# Patient Record
Sex: Male | Born: 1950 | Race: White | Hispanic: No | Marital: Single | Smoking: Former smoker
Health system: Southern US, Community
[De-identification: ages and names within clinical notes are randomized; demographics above are authoritative.]

## PROBLEM LIST (undated history)

## (undated) DIAGNOSIS — R4182 Altered mental status, unspecified: Secondary | ICD-10-CM

## (undated) DIAGNOSIS — E785 Hyperlipidemia, unspecified: Secondary | ICD-10-CM

## (undated) DIAGNOSIS — I219 Acute myocardial infarction, unspecified: Secondary | ICD-10-CM

## (undated) DIAGNOSIS — I10 Essential (primary) hypertension: Secondary | ICD-10-CM

---

## 1998-06-30 ENCOUNTER — Emergency Department (HOSPITAL_COMMUNITY): Admission: EM | Admit: 1998-06-30 | Discharge: 1998-06-30 | Payer: Self-pay | Admitting: Emergency Medicine

## 2000-02-27 ENCOUNTER — Ambulatory Visit (HOSPITAL_COMMUNITY): Admission: RE | Admit: 2000-02-27 | Discharge: 2000-02-28 | Payer: Self-pay | Admitting: Cardiovascular Disease

## 2007-04-27 ENCOUNTER — Emergency Department (HOSPITAL_COMMUNITY): Admission: EM | Admit: 2007-04-27 | Discharge: 2007-04-27 | Payer: Self-pay | Admitting: Emergency Medicine

## 2008-08-10 ENCOUNTER — Inpatient Hospital Stay (HOSPITAL_COMMUNITY): Admission: EM | Admit: 2008-08-10 | Discharge: 2008-08-12 | Payer: Self-pay | Admitting: Emergency Medicine

## 2008-08-10 IMAGING — CT CT ABDOMEN W/ CM
1 of 2 series · 15 of 32 positions shown, 19 images · IV contrast (agent unspecified)
Comparison: None.

CT ABDOMEN

CLINICAL DATA: Diffuse abdominal pain.  Nausea vomiting and
constipation.

CT ABDOMEN AND PELVIS WITH CONTRAST
TECHNIQUE: Multidetector CT imaging of the abdomen and pelvis was
performed using the standard protocol following bolus
administration of intravenous contrast.
Contrast: 100 ml [JN] IV.

[Series 2: rtn ap with st · axial · 0.74mm/px · z∈[-460,-10]mm · 15 of 100 slices shown, 19 images]
[im 5/100  soft-tissue]
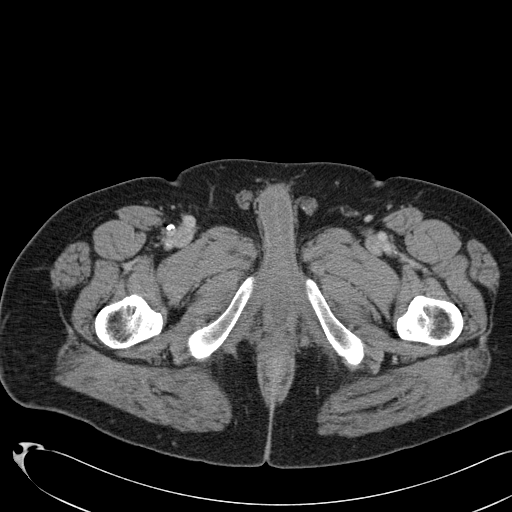
[im 5/100  bone]
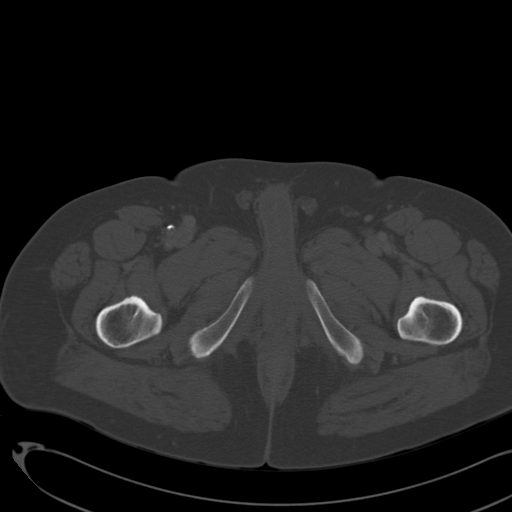
[im 13/100  soft-tissue]
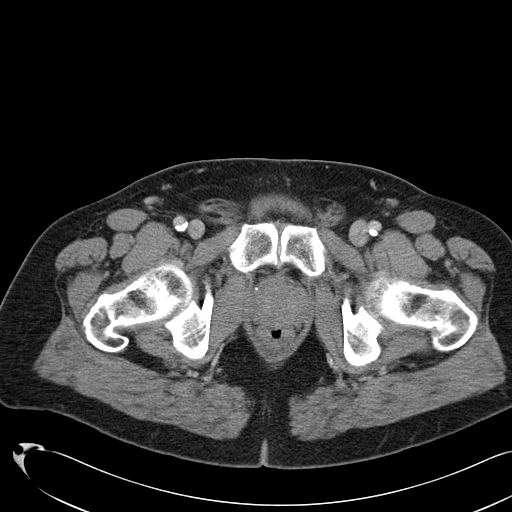
[im 22/100  soft-tissue]
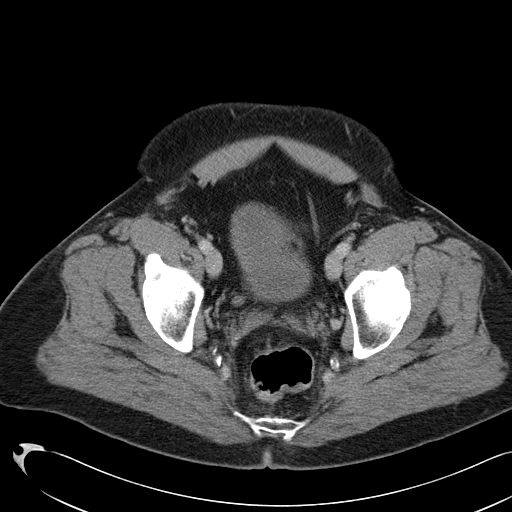
[im 26/100  soft-tissue]
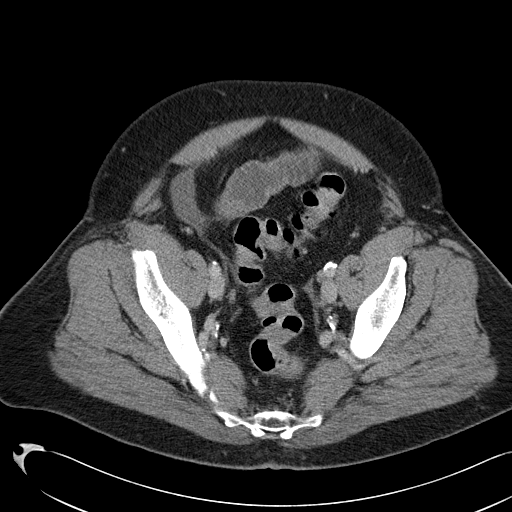
[im 35/100  soft-tissue]
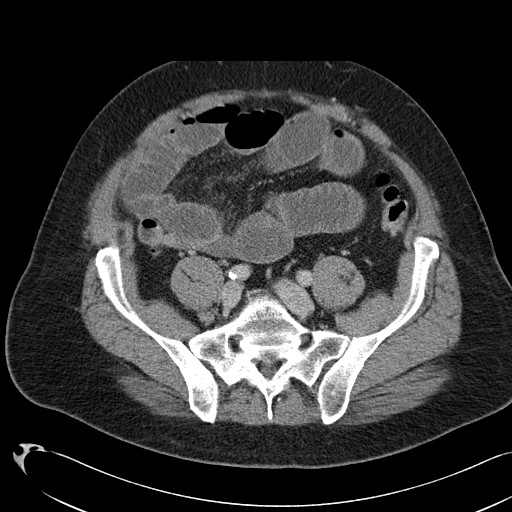
[im 44/100  soft-tissue]
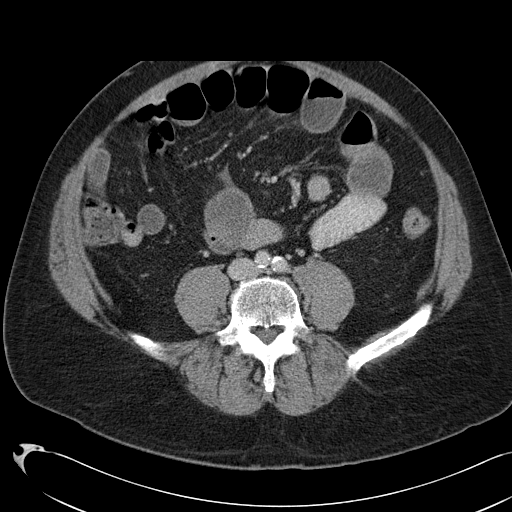
[im 52/100  soft-tissue]
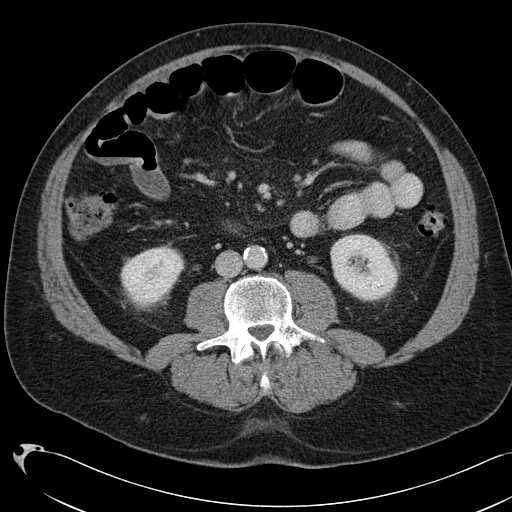
[im 56/100  soft-tissue]
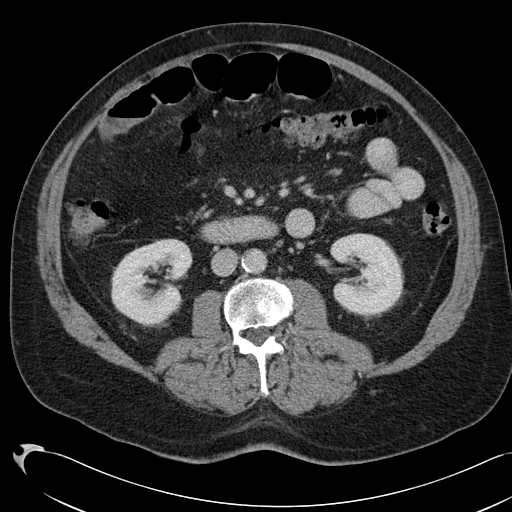
[im 65/100  soft-tissue]
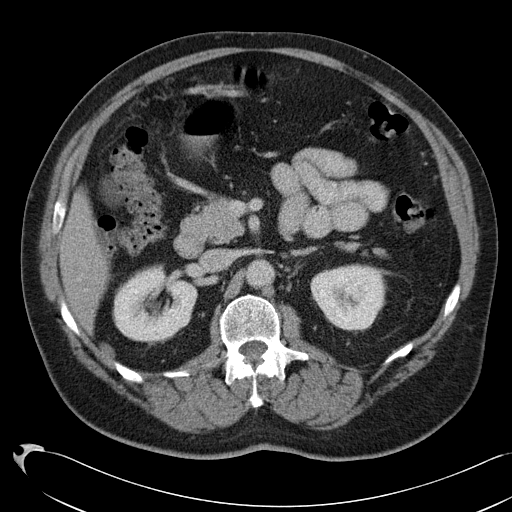
[im 65/100  bone]
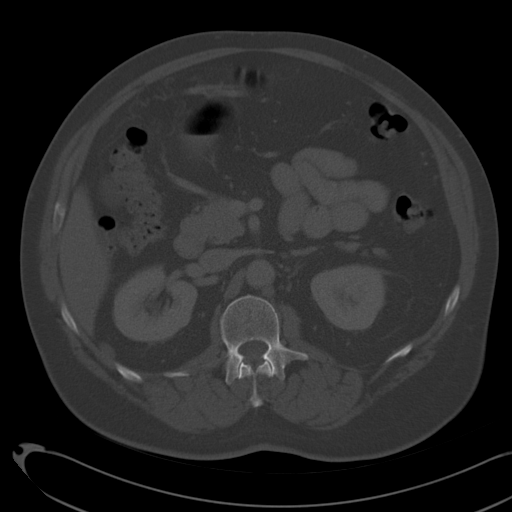
[im 74/100  soft-tissue]
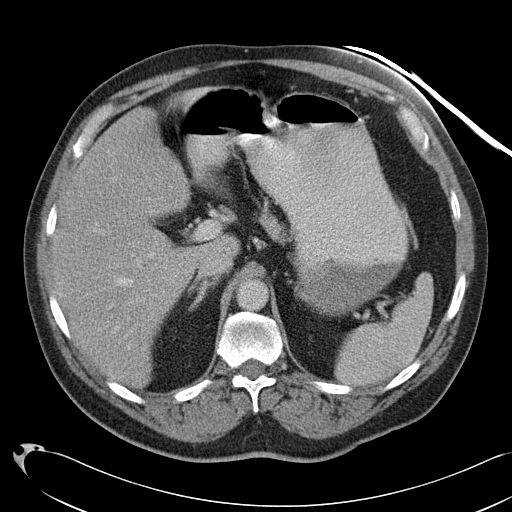
[im 78/100  soft-tissue]
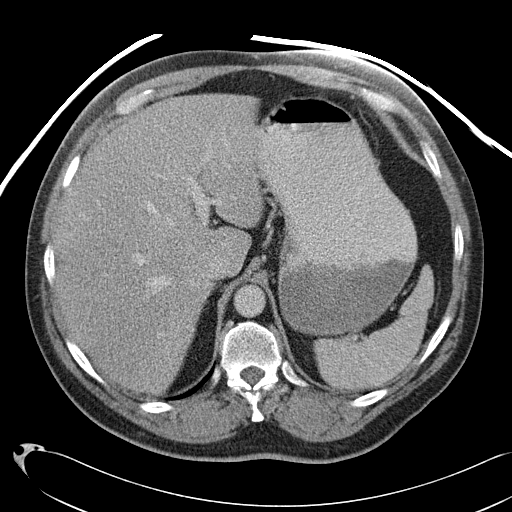
[im 82/100  lung]
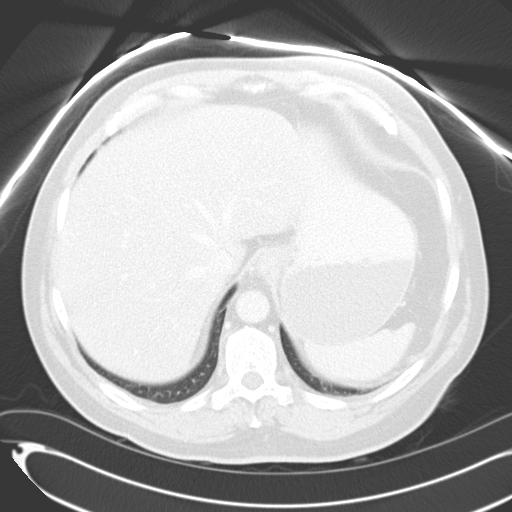
[im 87/100  soft-tissue]
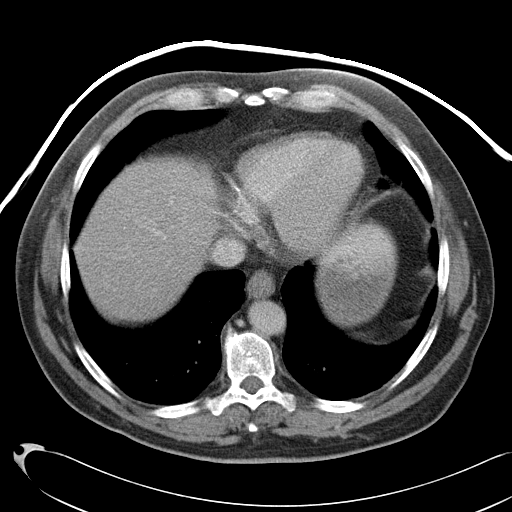
[im 87/100  lung]
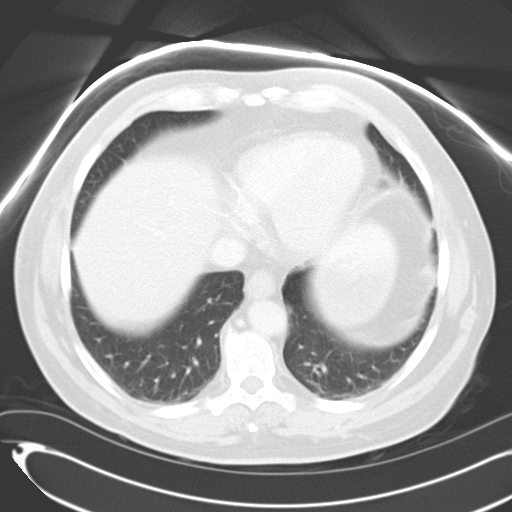
[im 91/100  lung]
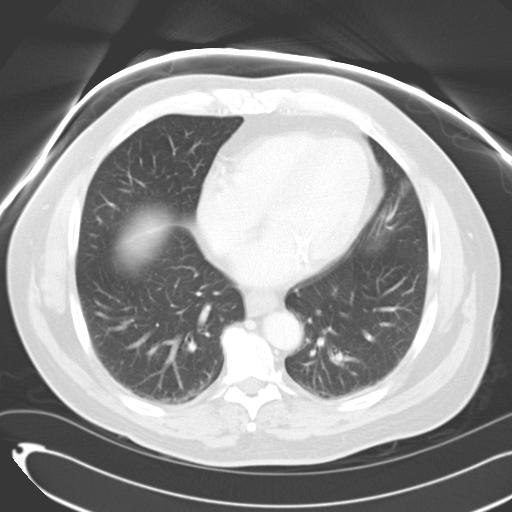
[im 95/100  soft-tissue]
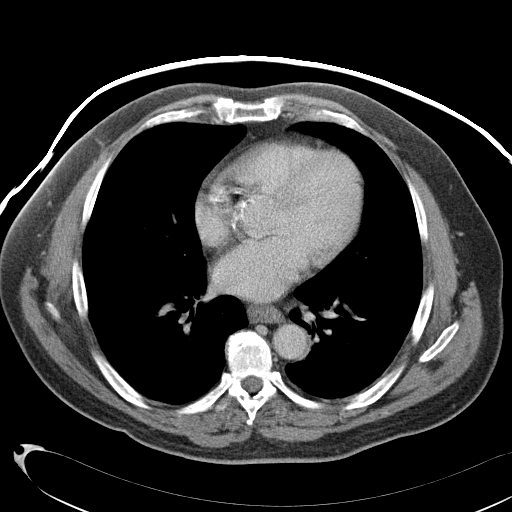
[im 95/100  lung]
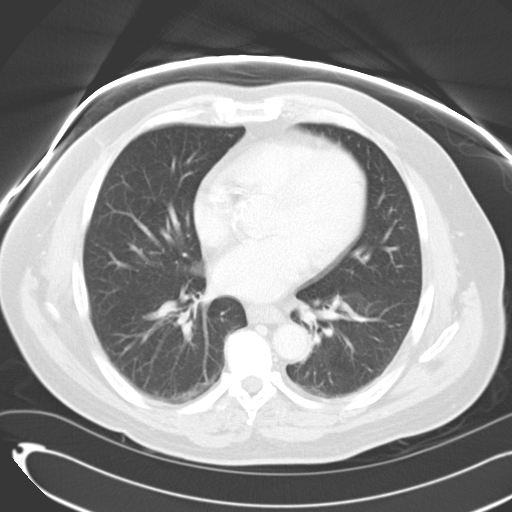

[15 of 32 positions shown; findings below may reference images not displayed]

FINDINGS: The lung bases are clear.  There is coronary artery
calcification.  The heart size is normal.

The liver and gallbladder are normal.  The pancreas spleen and
kidneys are normal.  There is no mass or adenopathy.  The proximal
small bowel is not dilated.  There is pandiverticulosis in the
colon.
IMPRESSION: No acute abnormality.  Colonic diverticulosis is extensive but no
acute diverticulitis is present.

CT PELVIS
FINDINGS: There is pan colonic diverticulosis.  Fluid-filled
small bowel loops the pelvis are mildly dilated.  The proximal
small bowel   is nondilated.  This could be due to a distal colonic
obstruction or ileus.  The colon is not dilated.  The appendix is
normal.  There is no free fluid or mass.  The prostate is mildly
enlarged.
IMPRESSION: Mildly dilated fluid-filled loops of ileum in the pelvis may
represent an ileus or a partial obstruction.

## 2008-08-10 IMAGING — CR DG ABDOMEN ACUTE W/ 1V CHEST
4 series · 4 of 4 positions shown · non-contrast
Comparison: None available.

CLINICAL DATA: Abdominal pain and nausea.

ACUTE ABDOMEN SERIES (ABDOMEN 2 VIEW & CHEST 1 VIEW)

[w chest pa]
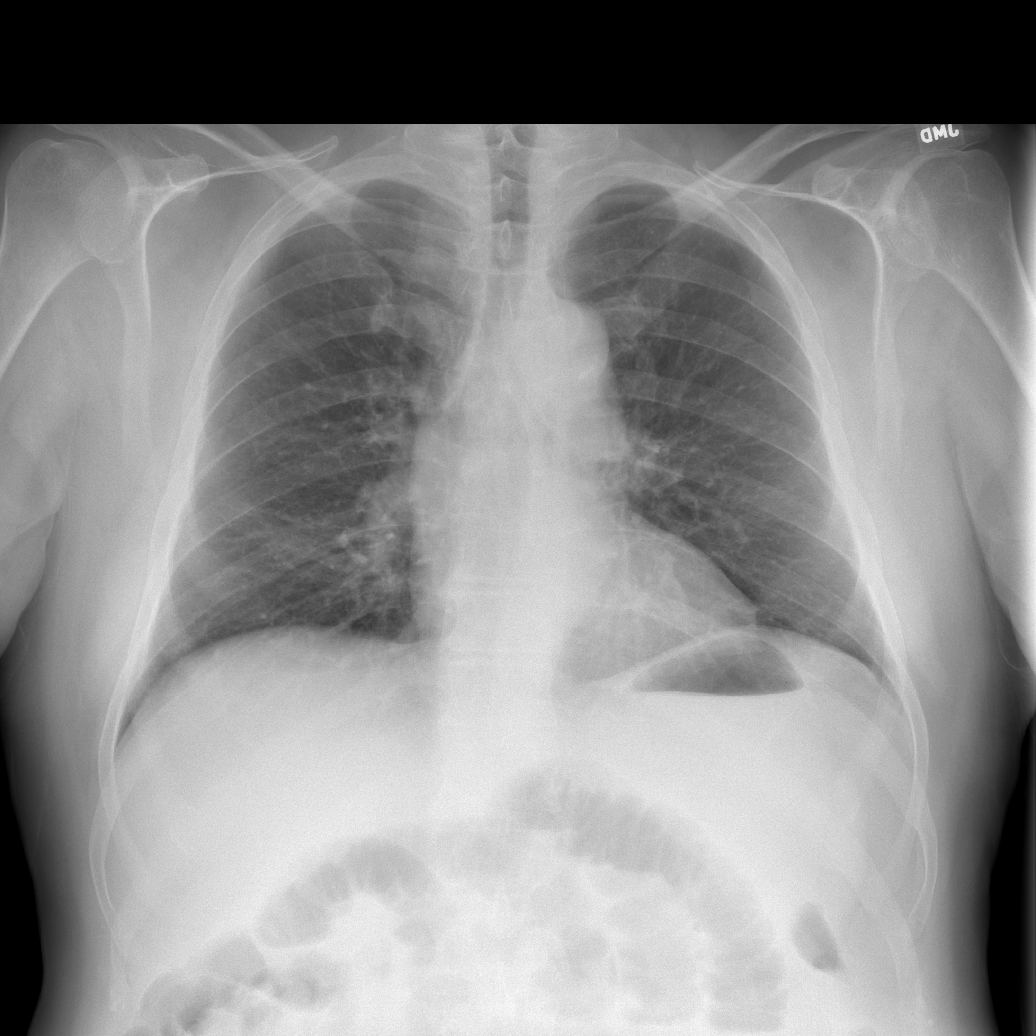

[w abdomen upright]
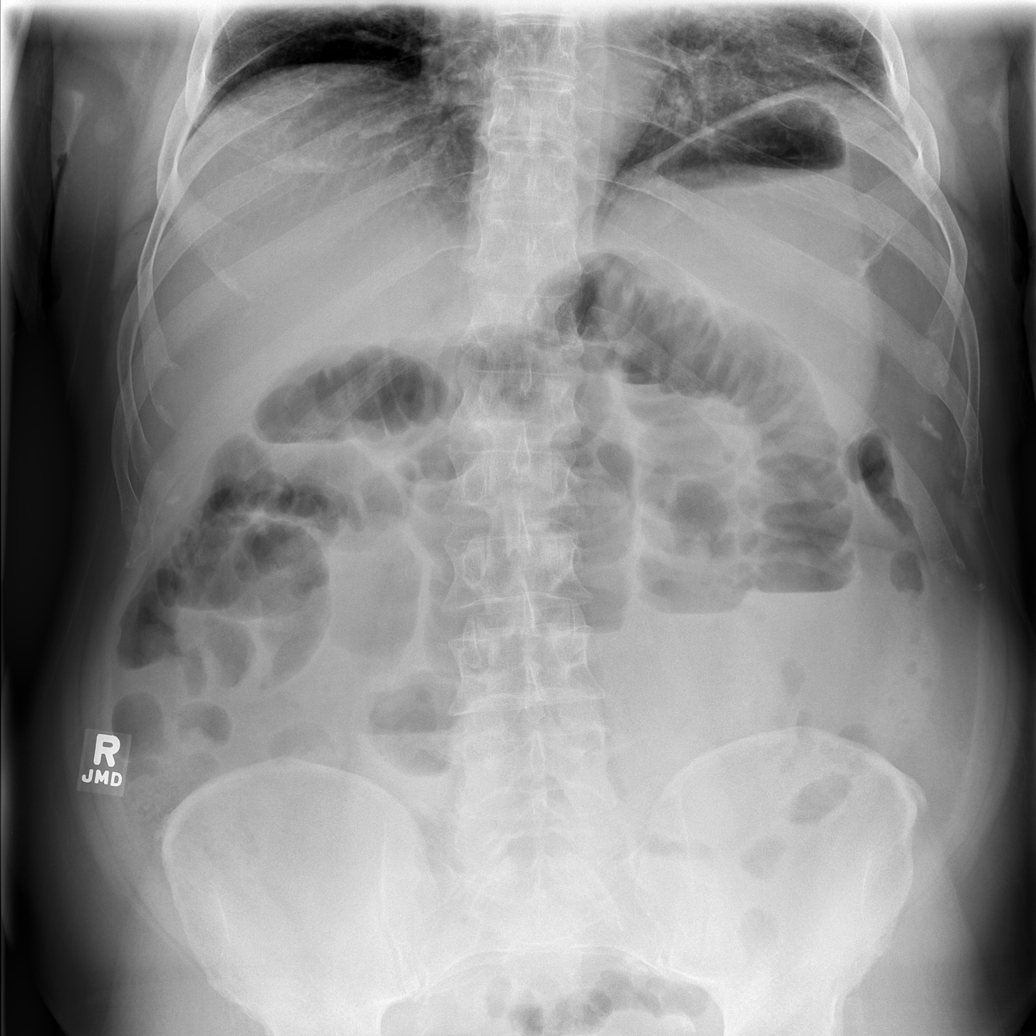

[t abdomen supine (1 of 2)]
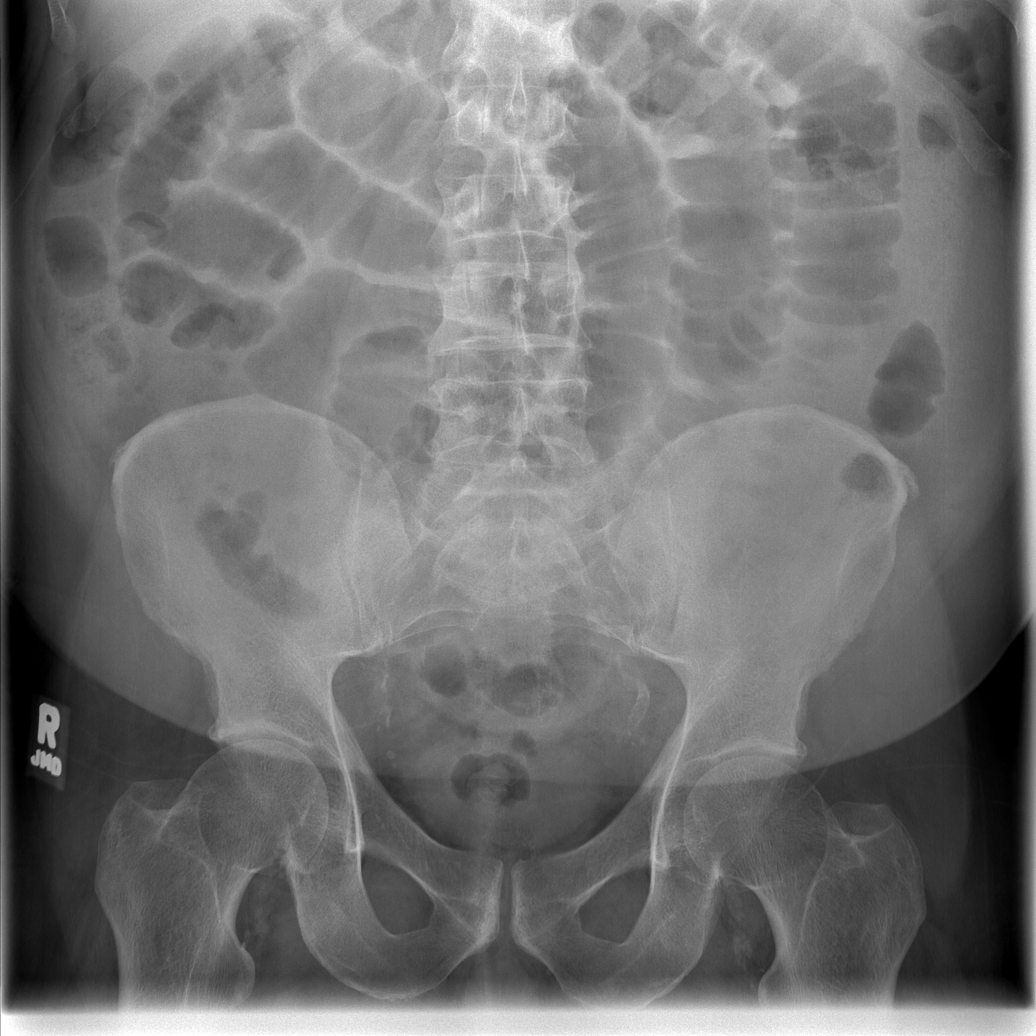

[t abdomen supine (2 of 2)]
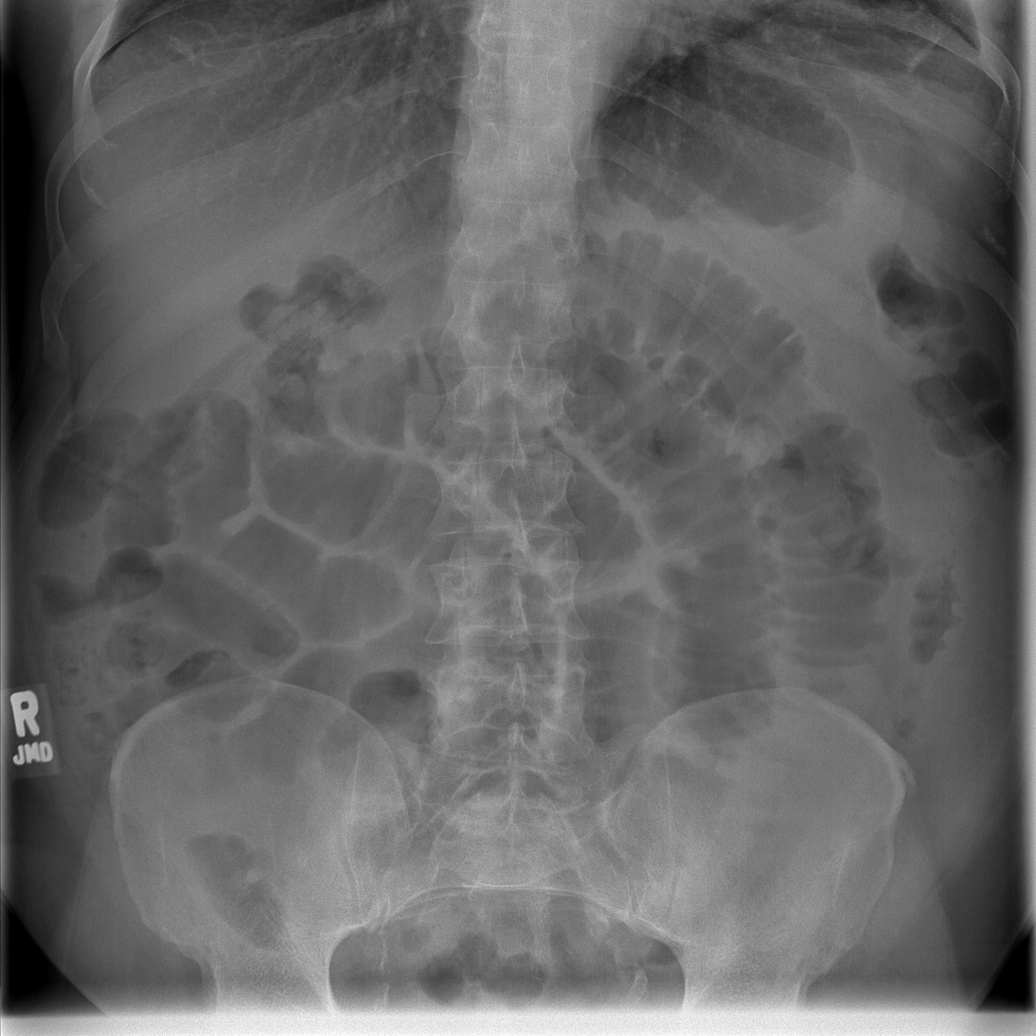

[4 of 4 positions shown; findings below may reference images not displayed]

FINDINGS: Single view of the chest demonstrates clear lungs and
normal heart size.  No pleural effusion.

Two views of the abdomen show no free intraperitoneal air.  There
is diffuse gaseous distention of small bowel with air-fluid levels
identified.  A small amount of gas and stool present the colon.
IMPRESSION: 1.  Bowel gas pattern compatible with small bowel obstruction.  No
free air.
2.  No acute cardiopulmonary disease.

## 2008-08-12 IMAGING — CR DG ABDOMEN 2V
2 series · 2 of 2 positions shown · non-contrast
Comparison: [DATE]

CLINICAL DATA: Nausea, vomiting, abdominal pain

ABDOMEN - 2 VIEW

[w abdomen upright]
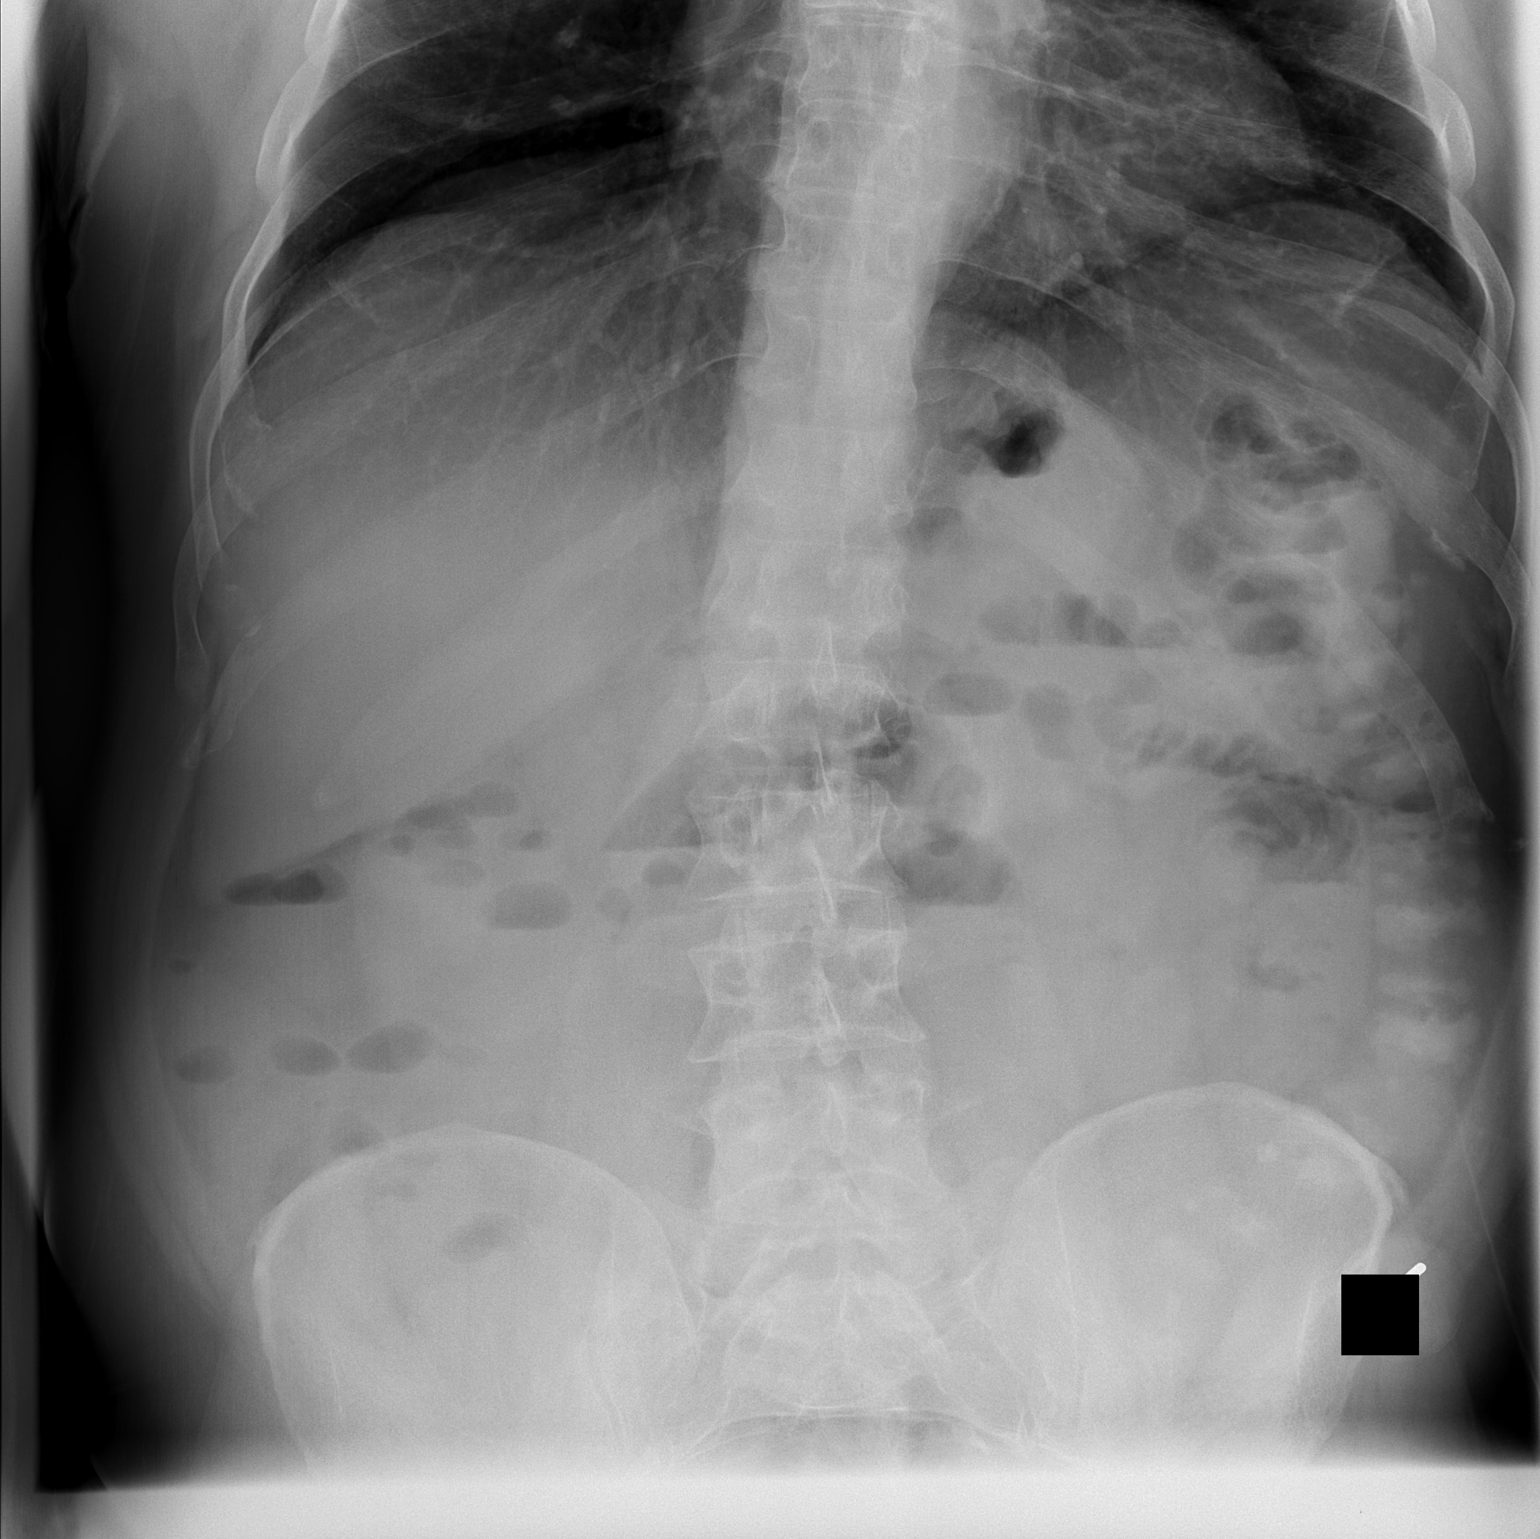

[t abdomen supine]
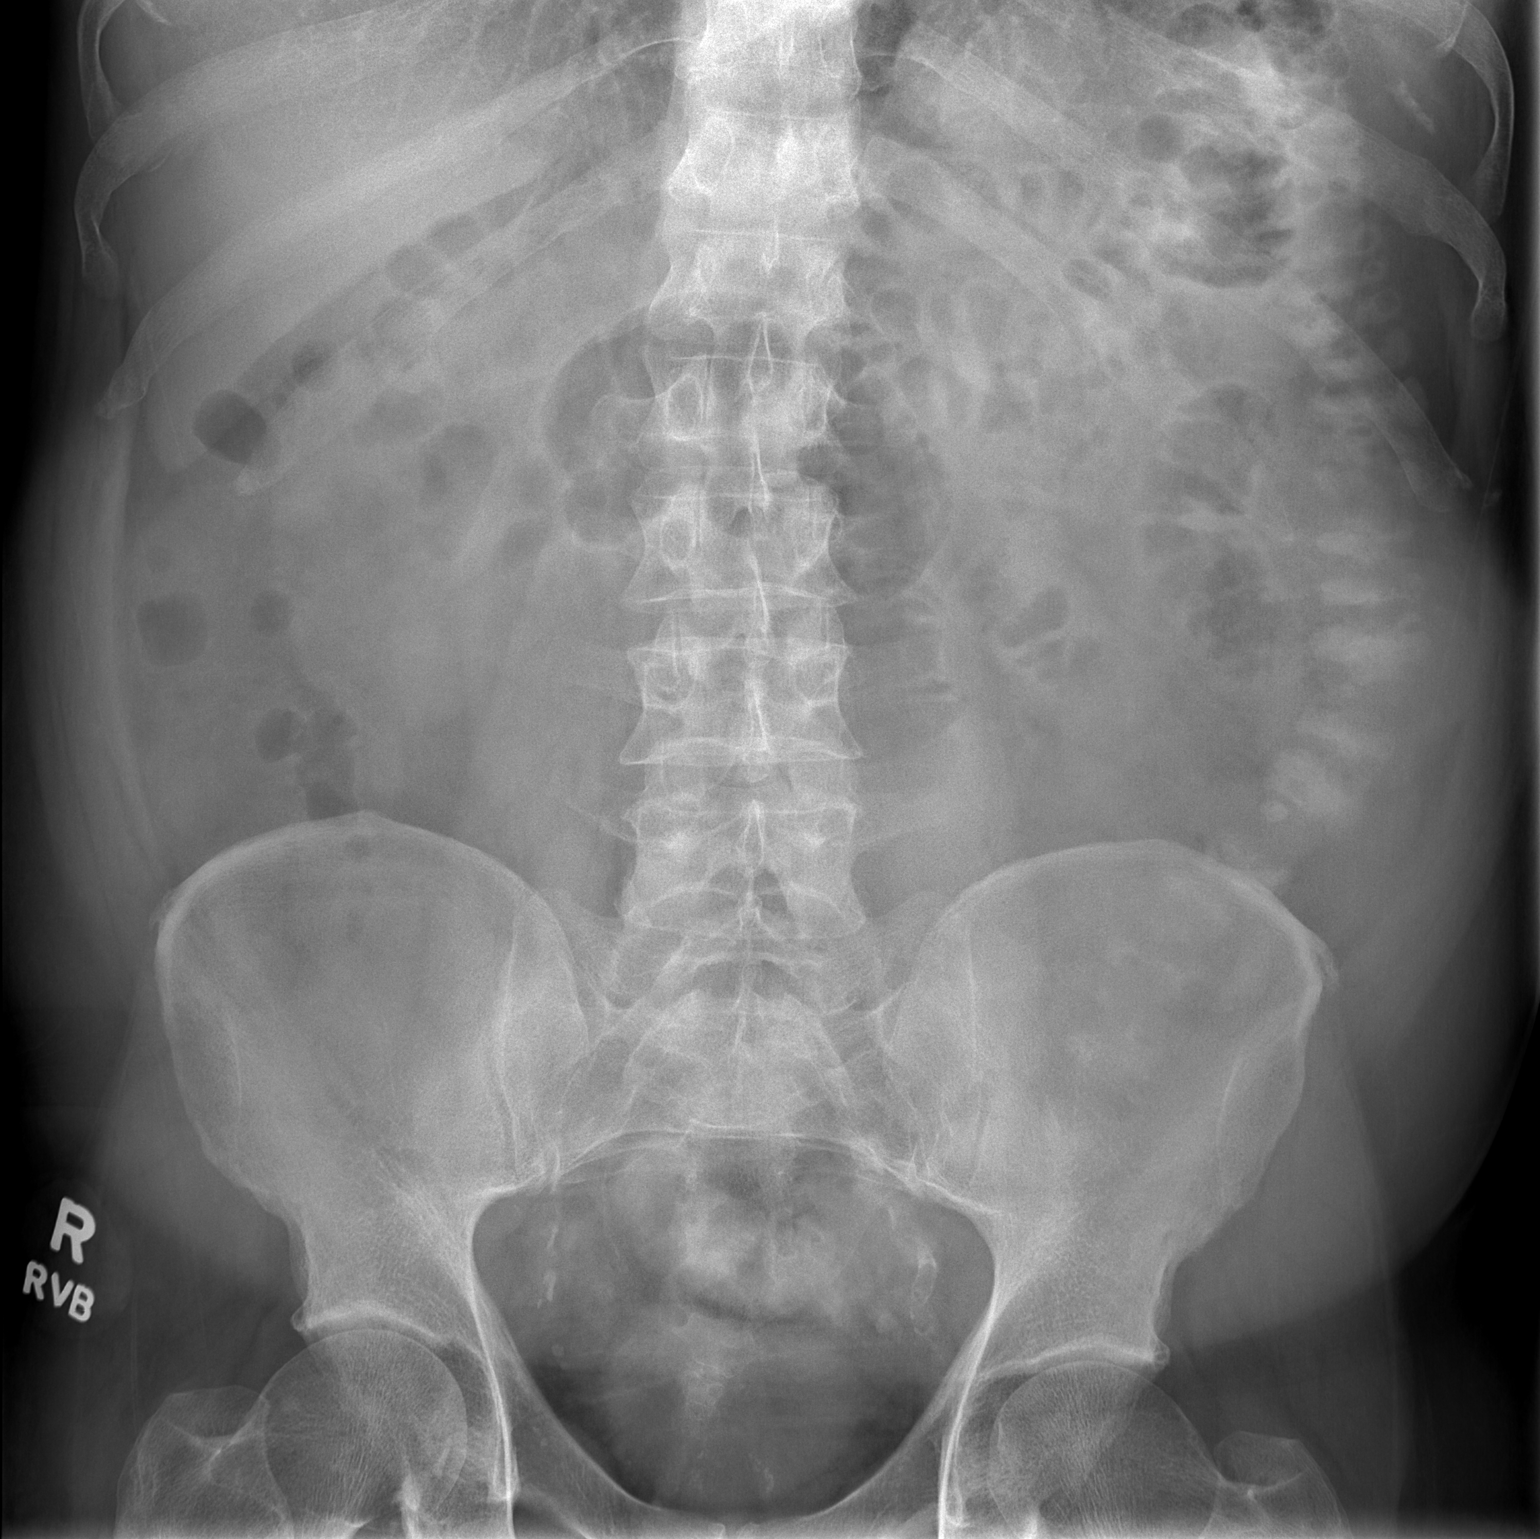

[2 of 2 positions shown; findings below may reference images not displayed]

FINDINGS: Improvement in small bowel dilatation compared to
[DATE].  Upright exam demonstrates diffuse scattered air fluid
levels.  No free air.  Lung bases are clear.  Progression of
contrast into the colon.
IMPRESSION: Improving bowel gas pattern.

## 2010-04-27 LAB — COMPREHENSIVE METABOLIC PANEL
ALT: 18 U/L (ref 0–53)
Albumin: 4.1 g/dL (ref 3.5–5.2)
BUN: 8 mg/dL (ref 6–23)
CO2: 30 mEq/L (ref 19–32)
Calcium: 9.3 mg/dL (ref 8.4–10.5)
Creatinine, Ser: 0.88 mg/dL (ref 0.4–1.5)
Creatinine, Ser: 1.02 mg/dL (ref 0.4–1.5)
GFR calc non Af Amer: >60 mL/min (ref >60)
Glucose, Bld: 118 mg/dL — ABNORMAL HIGH (ref 70–99)
Potassium: 3.9 mEq/L (ref 3.5–5.1)
Sodium: 138 mEq/L (ref 135–145)
Total Bilirubin: 1.1 mg/dL (ref 0.3–1.2)
Total Protein: 7.7 g/dL (ref 6.0–8.3)

## 2010-04-27 LAB — BASIC METABOLIC PANEL
Calcium: 10.1 mg/dL (ref 8.4–10.5)
Chloride: 98 mEq/L (ref 96–112)
Creatinine, Ser: 0.87 mg/dL (ref 0.4–1.5)
GFR calc Af Amer: >60 mL/min (ref >60)
GFR calc non Af Amer: >60 mL/min (ref >60)

## 2010-04-27 LAB — CBC
HCT: 56.9 % — ABNORMAL HIGH (ref 39.0–52.0)
MCV: 92.9 fL (ref 78.0–100.0)
MCV: 93.6 fL (ref 78.0–100.0)
Platelets: 249 10*3/uL (ref 150–400)
RBC: 5.15 MIL/uL (ref 4.22–5.81)
RDW: 13.2 % (ref 11.5–15.5)
WBC: 6.4 10*3/uL (ref 4.0–10.5)

## 2010-04-27 LAB — LIPID PANEL
LDL Cholesterol: 144 mg/dL — ABNORMAL HIGH (ref 0–99)
Total CHOL/HDL Ratio: 3.3 RATIO
Triglycerides: 64 mg/dL (ref <150)
VLDL: 13 mg/dL (ref 0–40)

## 2010-04-27 LAB — DIFFERENTIAL
Lymphocytes Relative: 11 % — ABNORMAL LOW (ref 12–46)
Monocytes Absolute: 0.3 10*3/uL (ref 0.1–1.0)
Monocytes Relative: 3 % (ref 3–12)
Neutro Abs: 8.4 10*3/uL — ABNORMAL HIGH (ref 1.7–7.7)

## 2010-04-27 LAB — URINALYSIS, ROUTINE W REFLEX MICROSCOPIC
Bilirubin Urine: NEGATIVE
Ketones, ur: NEGATIVE mg/dL
Nitrite: NEGATIVE
Urobilinogen, UA: 0.2 mg/dL (ref 0.0–1.0)

## 2010-06-03 NOTE — Discharge Summary (Signed)
[name], [name]              ACCOUNT NO.:  [id number]   MEDICAL RECORD NO.:  [id number]          PATIENT TYPE:  INP   LOCATION:  1510                         FACILITY:  [facility]   PHYSICIAN:  Beckey Rutter, MD  DATE OF BIRTH:  1950/12/27   DATE OF ADMISSION:  08/10/2008  DATE OF DISCHARGE:  08/12/2008                               DISCHARGE SUMMARY   PRIMARY CARE PHYSICIAN:  Dr. Marjory Lies.   CHIEF COMPLAINT:  Abdominal pain.   HISTORY OF PRESENT ILLNESS:  This-60 year old male was admitted with  abdominal pain and found to have signs of intestinal obstruction on the  CT of the abdomen.   HOSPITAL IMAGING:  1. A CT of the abdomen and pelvis on August 10, 2008, revealed an      impression of no acute abnormality, colonic diverticulosis is      extensive, but no acute diverticulitis at this time.  The CT of the      pelvis was showing mildly dilated fluid-filled loops of ileum in      the pelvis, might represent an ileus or partial obstruction.  2. The abdominal x-ray done the same day was showing bowel gas pattern      compatible with a small bowel obstruction.  No free air.  No acute      cardiopulmonary disease.  3. Abdominal x-ray done today showing improvement of the bowel gas      pattern.Marland Kitchen   DISCHARGE DIAGNOSES:  1. Ileus, versus small-bowel obstruction, resolved.  2..  Coronary artery disease status post percutaneous transluminal  coronary angiography on obtuse marginal coronary artery in  1997.  1. Hypertension.  2. Hyperlipidemia.  3. History of left dislocated shoulder.   DISCHARGE MEDICATIONS:  1. Lisinopril 40 mg p.o. daily.  2. Hydrochlorothiazide 25 mg daily.  3. Potassium chloride 20 mEq p.o. daily.   HOSPITAL COURSE:  During the hospital stay, the patient was kept n.p.o.  and the diet was introduced slowly.  The patient denied any nausea,  vomiting, abdominal bloating or distention.  His abdominal x-ray is  showing improvement of his obstruction  signs.  The patient is able to  tolerate regular food without any pain.  He is stable for discharge.   The patient was advised to follow up with his primary physician, as  discussed with him.  He is aware and agreeable to the discharging follow-  up plan.      Beckey Rutter, MD  Electronically Signed     EME/MEDQ  D:  08/12/2008  T:  08/12/2008  Job:  [phone]

## 2010-06-03 NOTE — H&P (Signed)
[name], [name]              ACCOUNT NO.:  [id number]   MEDICAL RECORD NO.:  [id number]          PATIENT TYPE:  INP   LOCATION:  1510                         [facility]   PHYSICIAN:  Massie Maroon, MD        DATE OF BIRTH:  13-Nov-1950   DATE OF ADMISSION:  08/10/2008  DATE OF DISCHARGE:                              HISTORY & PHYSICAL   CARDIOLOGIST:  Dr. Leodis Sias.   PRIMARY CARE PHYSICIAN:  Dr. Doristine Counter.   OPHTHALMOLOGIST:  Dr. Lawerance Bach.   CHIEF COMPLAINT:  Abdominal pain.   HISTORY OF PRESENT ILLNESS:  This is a 60 year old male with a history  of CAD status post PTCA, obtuse marginal in 1997.  He complains of  waking up with increased distention of the abdomen.  He is not able to  obtain relief through burping.  He apparently went to [facility] to get Gas-X  and when he took it had an episode of nausea and vomiting, but no blood.  The patient noted some epigastric discomfort which later spread to the  lower abdomen.  The patient denies any fever, chills, diarrhea,  constipation, bright red blood per rectum or black stool.  The patient  was evaluated in the ED and his liver function was within normal limits  and his lipase was normal.  The patient had a CT scan which showed  pancolonic diverticulosis.  Fluid filled small bowel loops in the pelvis  are mildly dilated and the proximal small bowel was nondilated.  This  could be due to a distal colonic obstruction or ileus.  The colon is not  dilated.  The appendix is normal.  There is no free fluid or mass.  The  prostate is mildly enlarged.  Impression:  Mildly dilated, fluid-filled  loops of the ileum in the pelvis may represent an ileus or partial  obstruction.  The patient will be admitted for observation of abdominal  pain.   PAST MEDICAL HISTORY:  1. CAD status post PTCA obtuse marginal in 1997.  2. Hypertension.  3. Hyperlipidemia.  4. Left dislocated shoulder.   PAST SURGICAL HISTORY:  Left shoulder surgery for  dislocated left  shoulder by Dr. Thomasena Edis.   SOCIAL HISTORY:  The patient does not smoke.  He drinks beer. He quit  smoking about 6 years ago on August 4th.  He smoked one pack per day  times 30 years.  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   FAMILY HISTORY:  Mother died at age 70 of TTP.  Father died of heart  attack at age 106 and was a smoker.  He has one sibling, a half-brother,  whom he does not know any past medical history.   ALLERGIES:  NO KNOWN DRUG ALLERGIES.   MEDICATIONS:  1. Lisinopril 40 mg p.o. daily.  2. Hydrochlorothiazide 25 mg daily.  3. Potassium chloride 20 mEq p.o. daily.   REVIEW OF SYSTEMS:  Negative for all 10 organ systems except for  pertinent positives as stated above.   PHYSICAL EXAM:  Temperature 97.8, pulse 97, blood pressure 195/118,  respiratory  rate 22, pulse ox 90% on room air.  HEENT: Anicteric, EOMI, no nystagmus, pupils 1.5 mm, symmetric, direct,  consensual, near reflex intact.  Mucous membranes moist.  NECK: No JVD,  no bruit, no thyromegaly, no adenopathy.  HEART:  Regular rate and rhythm.  S1-S2.  No murmurs, gallops or rubs.  LUNGS:  Clear to auscultation bilaterally.  ABDOMEN: Soft, nontender, nondistended.  Positive bowel sounds.  EXTREMITIES: No cyanosis, clubbing or edema, DP pulses 2+ bilaterally,  pes planus.   LABORATORY DATA:  Urinalysis negative, lipase 24, sodium 141, potassium  2.9, chloride 100, bicarb 31, BUN 8, creatinine 0.88, glucose 133.  AST 24, ALT 24, alk phos 70, total bilirubin 1.1.  WBC 9.8, hemoglobin  19.1 (high), platelet count 248.   ASSESSMENT/PLAN:  #1.  Abdominal pain, nausea and vomiting secondary to  ileus versus partial small-bowel obstruction.  The patient was made  n.p.o. and hydrated with IV fluids.  The patient refused NG tube.  Surgical consult in the a.m. to evaluate possible partial small-bowel  obstruction versus ileus.  #2.  Hypertension:  The patient will be restarted on his  lisinopril 40  mg p.o. daily along with hydrochlorothiazide 25 mg p.o. daily.  #3.  Coronary artery disease  status post PTCA (percutaneous  transluminal coronary angioplasty).  Continue aspirin and start on  simvastatin; however, prior to that we will check a lipid panel.  #4.  Polycythemia.  Repeat CBC in the a.m.  If the patient still has  polycythemia, then further workup can ensue.  #5.  Deep vein thrombus prophylaxis.  SCDs (suppression compression  devices) and TEDs (thrombo-embolic deterrent stockings).      Massie Maroon, MD  Electronically Signed     JYK/MEDQ  D:  08/10/2008  T:  08/11/2008  Job:  [id number]   cc:   Vesta Mixer, M.D.  Fax: [phone]   Marjory Lies, M.D.  Fax: [phone]   Dr. Lawerance Bach, Opthalmologist

## 2010-06-06 NOTE — Discharge Summary (Signed)
[place]  Patient:    [name], [name]                     MRN: [id number] Adm. Date:  [id number] Disc. Date: [id number] Attending:  Koren Bound                           Discharge Summary  DISCHARGE DIAGNOSES: 1. Unstable angina. 2. History of coronary artery disease - status post percutaneous transluminal    coronary angioplasty of his obtuse marginal artery.  He is now status post    an unsuccessful percutaneous transluminal coronary angioplasty of his left    circumflex artery on February 27, 2000. 3. Dyslipidemia. 4. Cigarette smoking. 5. Hypertension.  DISCHARGE MEDICATIONS: 1. Nicoderm patch #14 one a day. 2. Enteric coated aspirin once a day. 3. Paxil 20 mg a day. 4. Norvasc 5 mg a day. 5. Zestril 40 mg a day. 6. Potassium chloride 20 mEq a day. 7. Hydrochlorothiazide 25 mg a day. 8. Advicor 500/20 mg once a day. 9. Nitroglycerin as needed.  DISPOSITION:  The patient is to see Alvia Grove., M.D., in one week. He is to eat a low fat, low salt, low cholesterol diet.  He is to try to stop smoking.  HISTORY OF PRESENT ILLNESS:  [name] is a 60 year old gentleman who came into the office on February 27, 2000, with back and shoulder pain radiating through to his back.  He had T-wave inversions in the lateral leads very similar to his presenting episode in 1997.  HOSPITAL COURSE BY PROBLEMS:  #1 - CHEST PAIN:  The patient was taken to the catheterization lab.  He was found to have an occlusion of his small obtuse marginal artery.  He was also found to have severe diffuse disease involving a small posterior descending coronary artery.  We attempted to pass a wire down the obtuse marginal artery, but we were unsuccessful.  The patient remained stable and his chest pain gradually resolved.  His enzymes were negative.  It is thought that the occlusion of the obtuse marginal artery was chronic and was not the  acute cause of his chest pain.  He does have a small and diffusely diseased posterior descending coronary artery which may have been the cause of his chest pain.  His enzymes are negative and he apparently did not have a heart attack.  He was continued on Integrelin because of the wiring attempts. He is stable the morning of discharge and he has been up ambulating without problems.  He will be discharged on the above noted medications and will follow up with Dr. Elease Hashimoto.  #2 - HYPERCHOLESTEROLEMIA:  He will continue the Advicor.  We will follow up with a fasting lipid profile in the future.  #3 - HYPERTENSION:  Stable. DD:  02/28/00 TD:  03/01/00 Job: [id number]

## 2010-06-06 NOTE — Cardiovascular Report (Signed)
[place]  Patient:    [name], [name]                     MRN: [id number] Proc. Date: 03/12/00 Adm. Date:  [id number] Disc. Date: [id number] Attending:  Koren Bound                        Cardiac Catheterization  INDICATIONS:  Mr. Amsden is a 60 year old gentleman with a history of coronary artery disease.  He is status post PTCA of left circumflex artery several years ago.  He presents with worsening episodes of chest pain.  He is referred for heart catheterization for further evaluation.  PROCEDURES:  Left heart catheterization with coronary angiography with attempted percutaneous transluminal coronary angioplasty of the first obtuse marginal artery.  DESCRIPTION OF PROCEDURE:  The right femoral artery was easily cannulated using the modified Seldinger technique.  HEMODYNAMICS:  The left ventricular pressure is 99/12 with an aortic pressure of 98/65.  ANGIOGRAPHY:  The left main coronary artery is relatively normal. There are no discrete stenoses.  The left anterior descending artery has mild irregularities.  There are no discrete stenoses in the LAD or diagonal system.  The LAD reaches the apex and supplies the inferoapical wall.  The left circumflex artery is a moderate sized vessel.  It gives off a relatively large first obtuse marginal artery which is normal.  The second obtuse marginal artery is subtotally occluded and fills via left to left collaterals.  The distal left circumflex artery has only minor luminal irregularities.  The right coronary artery is tortuous and has moderate irregularities.  There is a proximal 40% stenosis.  The mid right coronary artery has a eccentric 30-40% stenosis.  There are diffuse minor irregularities throughout the distal right coronary artery and posterolateral branch as well as the posterior descending artery.  There is some collateral filling from the distal right coronary artery  up to the circumflex marginal distribution.  LEFT VENTRICULOGRAM:  The left ventriculogram was performed in a 30 RAO position.  It reveals well preserved left ventricular systolic function with an ejection fraction of 75%.  There is no mitral regurgitation.  PERCUTANEOUS TRANSLUMINAL CORONARY ANGIOPLASTY PROCEDURE:  The left main was engaged using a 7 [place] Judkins left 4 wire.  A Trooper 190 cm wire was placed down the vessel.  A total of 4500 of heparin were given followed by a double bolus Integrilin drip.  We were unable to cross the lesion with a Trooper wire.  A Cross-It 100 and a Cross-It 200 wire were also used in an attempt.  We were still unable to cross with the guidewire.  A 2.5 x 20 mm OpenSail balloon was used to provide backup and support for the wire.  Despite these efforts we were still unable to cross the lesion.  At this point, we aborted the procedure because inability to cross the second obtuse marginal stenosis.  The patient was taken back to the [facility] and was observed overnight and there were no complications.  His cardiac enzymes were negative.  CONCLUSIONS: 1. Coronary artery disease, primarily involving the second obtuse marginal    artery. 2. Moderate disease involving the left circumflex artery. 3. Well preserved left ventricular systolic function. 4. Unsuccessful percutaneous transluminal coronary angioplasty of the    left circumflex/obtuse marginal artery due to inability to cross with a    guidewire.  There were no complications. DD:  03/12/00 TD:  03/13/00 Job: [id number]

## 2010-06-06 NOTE — H&P (Signed)
[place]  Patient:    [name], [name]                     MRN: [id number] Adm. Date:  [id number] Disc. Date: [id number] Attending:  Koren Bound CC:         Teena Irani. Arlyce Dice, M.D.   History and Physical  HISTORY OF PRESENT ILLNESS:  [name] is a middle-aged gentleman with a history of coronary artery disease and cigarette smoking. He is admitted to the hospital for further evaluation and management of his persistent chest pain, arm pain and back pain.  [name] has a history of coronary artery disease with status post PTCA of his obtuse marginal artery in 1997. He has done fairly well, since that time. Twenty four hours before admission he started having episodes of chest pain. The chest pain radiated to his left arm and through to his back. He took several nitroglycerin, but did not get any relief. He states that these symptoms are fairly similar to his previous episodes of chest and arm pain when he presented in 1997. He has not had any significant shortness of breath. He has not had any pre-syncope, diaphoresis, nausea or vomiting. The pain is fairly constant and does not change when he takes a deep breath, moves around or does any exercise. The pain has been fairly constant since yesterday. It was relieved early this morning, but it has now returned and is no with him fairly continuously.  CURRENT MEDICATIONS: 1. Aspirin 81 mg a day. 2. Norvasc 5 mg a day. 3. Hydrochlorothiazide 25 mg a day. 4. Potassium chloride 10 mEq a day. 5. Zestril 40 mg a day. 6. Advicor once a day.  ALLERGIES:  No known drug allergies.  PAST MEDICAL HISTORY: 1. History of coronary artery disease - status post PTCA of his left    circumflex artery. 2. Hypercholesterolemia. 3. Hypertension.  SOCIAL HISTORY:  The patient still smokes between a half and one pack of cigarettes a day. He drinks alcohol occasionally.  FAMILY HISTORY:   Noncontributory.  REVIEW OF SYSTEMS:  As noted in the history of present illness, otherwise it is negative.  PHYSICAL EXAMINATION:  GENERAL:  He is as middle-aged gentleman in no acute distress. He is alert and oriented x 3, and his mood and affect are normal.  VITAL SIGNS:  Weight 193. Blood pressure 110/80 with a heart rate of 82.  NECK:  2+ carotids. No JVD, no thyromegaly.  LUNGS:  Clear to auscultation.  BACK:  Nontender.  HEART:  Regular rate. S1 and S2 with no murmurs, gallops or rubs.  ABDOMEN:  Good bowel sounds with no hepatosplenomegaly. No areas of tenderness.  EXTREMITIES:  Calves are nontender. No clubbing, cyanosis or edema. Pulses intact.  NEUROLOGICAL:  Cranial nerves II-XII intact and his motor and sensory function are intact. Gait normal.  SKIN:  Warm and dry.  EKG reveals normal sinus rhythm. He has T wave inversions in lead AVL. Repeat EKG 15 minutes later did not reveal any significant changes after several nitroglycerin.  IMPRESSION AND PLAN:  [name] presents with persistent chest pain, arm pain and back pain. His symptoms are somewhat characteristic of his previous episodes of angina. At this point, we will admit him to the hospital for urgent heart catheterization. We have discussed the risks, benefits and options of heart catheterization. He understands and agrees to proceed. His other medical problems are stable for the time being.  DD:  03/01/00 TD:  03/02/00 Job: [id number]

## 2014-07-27 ENCOUNTER — Encounter (HOSPITAL_COMMUNITY): Payer: Self-pay

## 2014-07-27 ENCOUNTER — Emergency Department (HOSPITAL_COMMUNITY)
Admission: EM | Admit: 2014-07-27 | Discharge: 2014-07-27 | Disposition: A | Discharge status: Home / Self Care | Payer: Self-pay | Attending: Emergency Medicine | Admitting: Emergency Medicine

## 2014-07-27 DIAGNOSIS — Y998 Other external cause status: Secondary | ICD-10-CM | POA: Insufficient documentation

## 2014-07-27 DIAGNOSIS — Y9389 Activity, other specified: Secondary | ICD-10-CM | POA: Insufficient documentation

## 2014-07-27 DIAGNOSIS — Z87891 Personal history of nicotine dependence: Secondary | ICD-10-CM | POA: Insufficient documentation

## 2014-07-27 DIAGNOSIS — S71132A Puncture wound without foreign body, left thigh, initial encounter: Secondary | ICD-10-CM | POA: Insufficient documentation

## 2014-07-27 DIAGNOSIS — W540XXA Bitten by dog, initial encounter: Secondary | ICD-10-CM | POA: Insufficient documentation

## 2014-07-27 DIAGNOSIS — Z23 Encounter for immunization: Secondary | ICD-10-CM | POA: Insufficient documentation

## 2014-07-27 DIAGNOSIS — I10 Essential (primary) hypertension: Secondary | ICD-10-CM | POA: Insufficient documentation

## 2014-07-27 DIAGNOSIS — Y92096 Garden or yard of other non-institutional residence as the place of occurrence of the external cause: Secondary | ICD-10-CM | POA: Insufficient documentation

## 2014-07-27 DIAGNOSIS — S71131A Puncture wound without foreign body, right thigh, initial encounter: Secondary | ICD-10-CM | POA: Insufficient documentation

## 2014-07-27 DIAGNOSIS — Z7982 Long term (current) use of aspirin: Secondary | ICD-10-CM | POA: Insufficient documentation

## 2014-07-27 DIAGNOSIS — S51831A Puncture wound without foreign body of right forearm, initial encounter: Secondary | ICD-10-CM | POA: Insufficient documentation

## 2014-07-27 HISTORY — DX: Essential (primary) hypertension: I10

## 2014-07-27 MED ORDER — AMOXICILLIN-POT CLAVULANATE 875-125 MG PO TABS: 1.0000 | ORAL_TABLET | Freq: Two times a day (BID) | ORAL | Status: DC

## 2014-07-27 MED ORDER — TETANUS-DIPHTH-ACELL PERTUSSIS 5-2.5-18.5 LF-MCG/0.5 IM SUSP
0.5000 mL | Freq: Once | INTRAMUSCULAR | Status: AC
  Administered 2014-07-27: 0.5 mL via INTRAMUSCULAR
  Filled 2014-07-27: qty 0.5

## 2014-07-27 MED ORDER — IBUPROFEN 800 MG PO TABS: 800.0000 mg | ORAL_TABLET | Freq: Three times a day (TID) | ORAL | Status: DC | PRN

## 2014-07-27 NOTE — ED Notes (Addendum)
Animal bite report faxed.  Pt presents with multiple bite marks to several areas on his body:  L thigh-5 puncture marks, R thigh-1 puncture mark, L elbow-1 puncture mark, and R wrist-2 puncture marks.  No active bleeding noted.  Hematoma and ecchymosis noted in his L thigh. Pt reports he was looking at this truck for sale in someone's yard, when 2 dobermans came out of nowhere.  He reports the male dog only sniffed him but the male dog started to attack him.  Pt states dog's owner reported to him that the dog's rabies shot is up-to-date but did not see a tag on the dog.

## 2014-07-27 NOTE — Discharge Instructions (Signed)
Return here as needed.  Follow-up with a primary care doctor.  Keep the areas clean and dry

## 2014-07-27 NOTE — ED Notes (Addendum)
Pt presents with c/o multiple dog bites. Pt was bit by an unknown dog around 2 pm to both arms and both thigh areas. Area to pt's left thigh is very swollen. Bleeding controlled in all areas. Pt is hypertensive in triage, hx of same.

## 2014-07-27 NOTE — ED Provider Notes (Signed)
CSN: [id number]     Arrival date & time 07/27/14  1548 History   First MD Initiated Contact with Patient 07/27/14 1601     Chief Complaint  Patient presents with  . Animal Bite     (Consider location/radiation/quality/duration/timing/severity/associated sxs/prior Treatment) HPI Patient presents to the emergency department with dog bite to the left thigh, left elbow, right forearm and right upper thigh.  The patient states that he was looking at a vehicle that was for sale in someone's yard when he states that a dog came out of nowhere attacked him.  Patient states that he has not have any nausea, vomiting, weakness, dizziness, numbness, or syncope.  The patient states that he does need a tetanus shot.  Patient states that palpation makes the areas hurt more Past Medical History  Diagnosis Date  . Hypertension    History reviewed. No pertinent past surgical history. No family history on file. History  Substance Use Topics  . Smoking status: Former Games developermoker  . Smokeless tobacco: Not on file  . Alcohol Use: Yes     Comment: occasionally     Review of Systems  All other systems negative except as documented in the HPI. All pertinent positives and negatives as reviewed in the HPI.  Allergies  Review of patient's allergies indicates no known allergies.  Home Medications   Prior to Admission medications   Medication Sig Start Date End Date Taking? Authorizing Provider  aspirin 325 MG EC tablet Take 650 mg by mouth every 6 (six) hours as needed for pain.   Yes Historical Provider, MD   BP 175/108 mmHg  Pulse 114  Temp(Src) 98.1 F (36.7 C) (Oral)  Resp 18  Wt 214 lb 11.2 oz (97.387 kg)  SpO2 93% Physical Exam  Constitutional: He appears well-developed and well-nourished. No distress.  HENT:  Head: Normocephalic and atraumatic.  Mouth/Throat: Oropharynx is clear and moist.  Eyes: Pupils are equal, round, and reactive to light.  Cardiovascular: Normal rate, regular rhythm and  normal heart sounds.  Exam reveals no gallop and no friction rub.   No murmur heard. Pulmonary/Chest: Effort normal and breath sounds normal. No respiratory distress.  Musculoskeletal:       Arms:      Legs:   ED Course  Procedures (including critical care time) The patient will be given a tetanus shot was.  We cleansed and he is advised to use Tylenol and Motrin for any pain.   also told him that we will have to contact animal control.  I advised him to keep the areas clean and dry    Charlestine NightChristopher Halaina Vanduzer, PA-C 07/27/14 1706  Lorre NickAnthony Allen, MD 07/27/14 2318

## 2014-08-03 ENCOUNTER — Emergency Department (HOSPITAL_COMMUNITY): Admission: EM | Admit: 2014-08-03 | Discharge: 2014-08-03 | Discharge status: Against Medical Advice | Payer: Self-pay

## 2014-08-03 NOTE — ED Notes (Signed)
Pt reported to registration that he was not able to stay and that he would have to leave and come back around 3:30.

## 2014-08-03 NOTE — ED Notes (Signed)
ED assistant director [name] spoke with patient and pt is going to leave without being seen. Pt checked in earlier for same and left without being seen once already today prior to triage.

## 2014-08-10 ENCOUNTER — Encounter (HOSPITAL_COMMUNITY): Payer: Self-pay | Admitting: Emergency Medicine

## 2014-08-10 ENCOUNTER — Emergency Department (HOSPITAL_COMMUNITY)
Admission: EM | Admit: 2014-08-10 | Discharge: 2014-08-10 | Disposition: A | Discharge status: Home / Self Care | Payer: Self-pay | Attending: Emergency Medicine | Admitting: Emergency Medicine

## 2014-08-10 DIAGNOSIS — Z5189 Encounter for other specified aftercare: Secondary | ICD-10-CM

## 2014-08-10 DIAGNOSIS — I10 Essential (primary) hypertension: Secondary | ICD-10-CM | POA: Insufficient documentation

## 2014-08-10 DIAGNOSIS — Z7982 Long term (current) use of aspirin: Secondary | ICD-10-CM | POA: Insufficient documentation

## 2014-08-10 DIAGNOSIS — Z4801 Encounter for change or removal of surgical wound dressing: Secondary | ICD-10-CM | POA: Insufficient documentation

## 2014-08-10 DIAGNOSIS — Z87891 Personal history of nicotine dependence: Secondary | ICD-10-CM | POA: Insufficient documentation

## 2014-08-10 NOTE — ED Notes (Signed)
Pt states he was bitten by a dog 2 weeks ago. Pt has finished course of Augmentin. Pt states he still has some pain and swelling to L thigh where dog bit him. Pt is concerned that he needs rabies vaccination because dog was not up to date on vaccinations. Pt states that dog was quarantined for 10 days and released back to owner on Monday. Pt states his doctor told him to get a rabies vaccination anyway. Pt ambulatory with steady gait.

## 2014-08-10 NOTE — ED Provider Notes (Signed)
CSN: [id number]     Arrival date & time 08/10/14  2029 History   This chart was scribed for non-physician practitioner Elpidio Anis, PA-C working with Jerelyn Scott, MD by Murriel Hopper, ED Scribe. This patient was seen in room WTR6/WTR6 and the patient's care was started at 8:51 PM.   Chief Complaint  Patient presents with  . Animal Bite      Patient is a 64 y.o. male presenting with animal bite. The history is provided by the patient. No language interpreter was used.  Animal Bite Contact animal:  Dog Location:  Shoulder/arm and leg Shoulder/arm injury location:  L elbow Leg injury location:  L upper leg and R upper leg Time since incident:  2 weeks Pain details:    Severity:  Moderate   Timing:  Constant Incident location:  Another residence Provoked: unprovoked   Notifications:  Animal control Animal's rabies vaccination status:  Never received Associated symptoms: no fever      HPI Comments: [name] is a 64 y.o. male who presents to the Emergency Department complaining of a wound check for an animal bite that occurred two weeks ago. Pt has wounds on his right and left anterior thigh and his left elbow, and states he is concerned about the bite on his left thigh because of a hematoma forming underneath areas where his scabs are healing. Pt states he recently saw his PCP at [facility] and states his primary doctor was concerned about his wounds as well. Pt states the dog that bit him was not UTD on his shots but was detained by animal control for 10 days after the attack occurred and did not have rabies.    Past Medical History  Diagnosis Date  . Hypertension    No past surgical history on file. No family history on file. History  Substance Use Topics  . Smoking status: Former Games developer  . Smokeless tobacco: Not on file  . Alcohol Use: Yes     Comment: occasionally     Review of Systems  Constitutional: Negative for fever.  Gastrointestinal:  Negative for nausea.  Musculoskeletal: Negative for myalgias.  Skin: Positive for color change and wound.  Neurological: Negative for headaches.      Allergies  Review of patient's allergies indicates no known allergies.  Home Medications   Prior to Admission medications   Medication Sig Start Date End Date Taking? Authorizing Provider  amoxicillin-clavulanate (AUGMENTIN) 875-125 MG per tablet Take 1 tablet by mouth every 12 (twelve) hours. 07/27/14   Charlestine Night, PA-C  aspirin 325 MG EC tablet Take 650 mg by mouth every 6 (six) hours as needed for pain.    Historical Provider, MD  ibuprofen (ADVIL,MOTRIN) 800 MG tablet Take 1 tablet (800 mg total) by mouth every 8 (eight) hours as needed. 07/27/14   Charlestine Night, PA-C   There were no vitals taken for this visit. Physical Exam  Constitutional: He is oriented to person, place, and time. He appears well-developed and well-nourished.  HENT:  Head: Normocephalic and atraumatic.  Neck: Normal range of motion.  Cardiovascular: Normal rate.   Pulmonary/Chest: Effort normal.  Abdominal: He exhibits no distension. There is no tenderness.  Neurological: He is alert and oriented to person, place, and time.  Skin: Skin is warm and dry.  Psychiatric: He has a normal mood and affect.  Nursing note and vitals reviewed.   ED Course  Procedures (including critical care time)  DIAGNOSTIC STUDIES: Oxygen Saturation is 93% on  room air, normal by my interpretation.    COORDINATION OF CARE: 8:58 PM Discussed treatment plan with pt at bedside and pt agreed to plan.   Labs Review Labs Reviewed - No data to display  Imaging Review No results found.   EKG Interpretation None      MDM   Final diagnoses:  None    1. Wound recheck  The patient's concern for rabies exposure was addressed with CDC information, physician consultation and history. No rabies vaccination was found to be necessary. The patient is afebrile, no  pain, wounds are healing well. The dog was quarantined until the 10th day after the bite and released by animal control. The patient is accepting of plan not to inoculate.   I personally performed the services described in this documentation, which was scribed in my presence. The recorded information has been reviewed and is accurate.     Elpidio Anis, PA-C 08/13/14 [id number]  Jerelyn Scott, MD 08/14/14 [phone]

## 2014-08-10 NOTE — Discharge Instructions (Signed)
RETURN TO THE EMERGENCY DEPARTMENT AS NEEDED FOR ANY REASON.

## 2020-03-15 ENCOUNTER — Emergency Department (HOSPITAL_COMMUNITY): Payer: Medicare HMO

## 2020-03-15 ENCOUNTER — Observation Stay (HOSPITAL_COMMUNITY)
Admission: EM | Admit: 2020-03-15 | Discharge: 2020-03-17 | Disposition: A | Discharge status: Home / Self Care | Payer: Medicare HMO | Attending: Family Medicine | Admitting: Family Medicine

## 2020-03-15 ENCOUNTER — Other Ambulatory Visit: Payer: Self-pay

## 2020-03-15 ENCOUNTER — Encounter (HOSPITAL_COMMUNITY): Payer: Self-pay | Admitting: Emergency Medicine

## 2020-03-15 DIAGNOSIS — E119 Type 2 diabetes mellitus without complications: Secondary | ICD-10-CM | POA: Diagnosis not present

## 2020-03-15 DIAGNOSIS — I2510 Atherosclerotic heart disease of native coronary artery without angina pectoris: Secondary | ICD-10-CM | POA: Insufficient documentation

## 2020-03-15 DIAGNOSIS — R519 Headache, unspecified: Secondary | ICD-10-CM | POA: Diagnosis present

## 2020-03-15 DIAGNOSIS — Z79899 Other long term (current) drug therapy: Secondary | ICD-10-CM | POA: Diagnosis not present

## 2020-03-15 DIAGNOSIS — Z7982 Long term (current) use of aspirin: Secondary | ICD-10-CM | POA: Insufficient documentation

## 2020-03-15 DIAGNOSIS — I10 Essential (primary) hypertension: Secondary | ICD-10-CM | POA: Diagnosis present

## 2020-03-15 DIAGNOSIS — R262 Difficulty in walking, not elsewhere classified: Secondary | ICD-10-CM | POA: Diagnosis not present

## 2020-03-15 DIAGNOSIS — Z20822 Contact with and (suspected) exposure to covid-19: Secondary | ICD-10-CM | POA: Insufficient documentation

## 2020-03-15 DIAGNOSIS — I63532 Cerebral infarction due to unspecified occlusion or stenosis of left posterior cerebral artery: Principal | ICD-10-CM | POA: Diagnosis present

## 2020-03-15 DIAGNOSIS — Z87891 Personal history of nicotine dependence: Secondary | ICD-10-CM | POA: Diagnosis not present

## 2020-03-15 DIAGNOSIS — I639 Cerebral infarction, unspecified: Secondary | ICD-10-CM

## 2020-03-15 DIAGNOSIS — R413 Other amnesia: Secondary | ICD-10-CM

## 2020-03-15 DIAGNOSIS — D751 Secondary polycythemia: Secondary | ICD-10-CM | POA: Diagnosis present

## 2020-03-15 HISTORY — DX: Cerebral infarction, unspecified: I63.9

## 2020-03-15 LAB — I-STAT CHEM 8, ED
BUN: 14 mg/dL (ref 8–23)
Calcium, Ion: 1.34 mmol/L (ref 1.15–1.40)
Chloride: 96 mmol/L — ABNORMAL LOW (ref 98–111)
Creatinine, Ser: 0.9 mg/dL (ref 0.61–1.24)
Glucose, Bld: 141 mg/dL — ABNORMAL HIGH (ref 70–99)
HCT: 53 % — ABNORMAL HIGH (ref 39.0–52.0)
Hemoglobin: 18 g/dL — ABNORMAL HIGH (ref 13.0–17.0)
Potassium: 4 mmol/L (ref 3.5–5.1)
Sodium: 137 mmol/L (ref 135–145)
TCO2: 32 mmol/L (ref 22–32)

## 2020-03-15 LAB — APTT: aPTT: 33 seconds (ref 24–36)

## 2020-03-15 LAB — URINALYSIS, ROUTINE W REFLEX MICROSCOPIC
Bacteria, UA: NONE SEEN
Bilirubin Urine: NEGATIVE
Glucose, UA: NEGATIVE mg/dL
Ketones, ur: NEGATIVE mg/dL
Leukocytes,Ua: NEGATIVE
Nitrite: NEGATIVE
Protein, ur: NEGATIVE mg/dL
Specific Gravity, Urine: 1.006 (ref 1.005–1.030)
pH: 7 (ref 5.0–8.0)

## 2020-03-15 LAB — CBC
HCT: 52.4 % — ABNORMAL HIGH (ref 39.0–52.0)
Hemoglobin: 17.9 g/dL — ABNORMAL HIGH (ref 13.0–17.0)
MCH: 31.7 pg (ref 26.0–34.0)
MCHC: 34.2 g/dL (ref 30.0–36.0)
MCV: 92.7 fL (ref 80.0–100.0)
Platelets: 297 10*3/uL (ref 150–400)
RBC: 5.65 MIL/uL (ref 4.22–5.81)
RDW: 12.4 % (ref 11.5–15.5)
WBC: 8.8 10*3/uL (ref 4.0–10.5)
nRBC: 0 % (ref 0.0–0.2)

## 2020-03-15 LAB — RAPID URINE DRUG SCREEN, HOSP PERFORMED
Amphetamines: NOT DETECTED
Barbiturates: NOT DETECTED
Benzodiazepines: NOT DETECTED
Cocaine: NOT DETECTED
Opiates: NOT DETECTED
Tetrahydrocannabinol: NOT DETECTED

## 2020-03-15 LAB — DIFFERENTIAL
Abs Immature Granulocytes: 0.03 10*3/uL (ref 0.00–0.07)
Basophils Absolute: 0.1 10*3/uL (ref 0.0–0.1)
Basophils Relative: 1 %
Eosinophils Absolute: 0 10*3/uL (ref 0.0–0.5)
Eosinophils Relative: 0 %
Immature Granulocytes: 0 %
Lymphocytes Relative: 11 %
Lymphs Abs: 1 10*3/uL (ref 0.7–4.0)
Monocytes Absolute: 0.4 10*3/uL (ref 0.1–1.0)
Monocytes Relative: 4 %
Neutro Abs: 7.3 10*3/uL (ref 1.7–7.7)
Neutrophils Relative %: 84 %

## 2020-03-15 LAB — COMPREHENSIVE METABOLIC PANEL
ALT: 25 U/L (ref 0–44)
AST: 24 U/L (ref 15–41)
Albumin: 5 g/dL (ref 3.5–5.0)
Alkaline Phosphatase: 68 U/L (ref 38–126)
Anion gap: 12 (ref 5–15)
BUN: 14 mg/dL (ref 8–23)
CO2: 29 mmol/L (ref 22–32)
Calcium: 10.9 mg/dL — ABNORMAL HIGH (ref 8.9–10.3)
Chloride: 94 mmol/L — ABNORMAL LOW (ref 98–111)
Creatinine, Ser: 0.92 mg/dL (ref 0.61–1.24)
GFR, Estimated: >60 mL/min (ref >60)
Glucose, Bld: 137 mg/dL — ABNORMAL HIGH (ref 70–99)
Potassium: 3.8 mmol/L (ref 3.5–5.1)
Sodium: 135 mmol/L (ref 135–145)
Total Bilirubin: 1.4 mg/dL — ABNORMAL HIGH (ref 0.3–1.2)
Total Protein: 8.8 g/dL — ABNORMAL HIGH (ref 6.5–8.1)

## 2020-03-15 LAB — PROTIME-INR
INR: 0.9 (ref 0.8–1.2)
Prothrombin Time: 11.4 seconds (ref 11.4–15.2)

## 2020-03-15 LAB — RESP PANEL BY RT-PCR (FLU A&B, COVID) ARPGX2
Influenza A by PCR: NEGATIVE
Influenza B by PCR: NEGATIVE
SARS Coronavirus 2 by RT PCR: NEGATIVE

## 2020-03-15 LAB — CBG MONITORING, ED: Glucose-Capillary: 120 mg/dL — ABNORMAL HIGH (ref 70–99)

## 2020-03-15 IMAGING — CT CT HEAD W/O CM
3 series · 15 of 47 positions shown, 18 images · non-contrast
Comparison: None.

CLINICAL DATA: Headache.  Mental status change, cause unknown.

EXAM:
CT HEAD WITHOUT CONTRAST
TECHNIQUE: Contiguous axial images were obtained from the base of the skull
through the vertex without intravenous contrast.

[Series 2: head wo · axial · 0.45mm/px · z∈[-41,+84]mm · 9 of 31 slices shown, 12 images]
[im 3/31  brain]
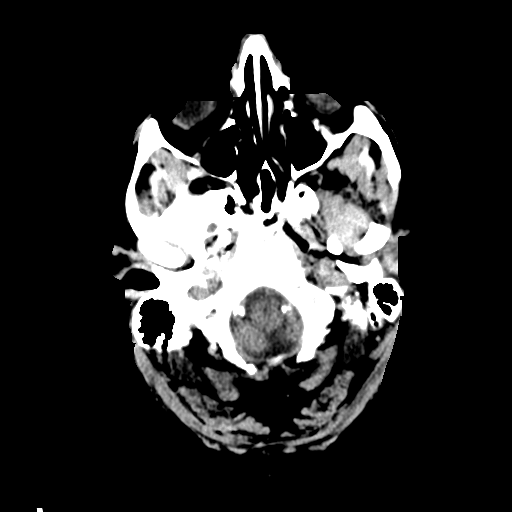
[im 3/31  bone]
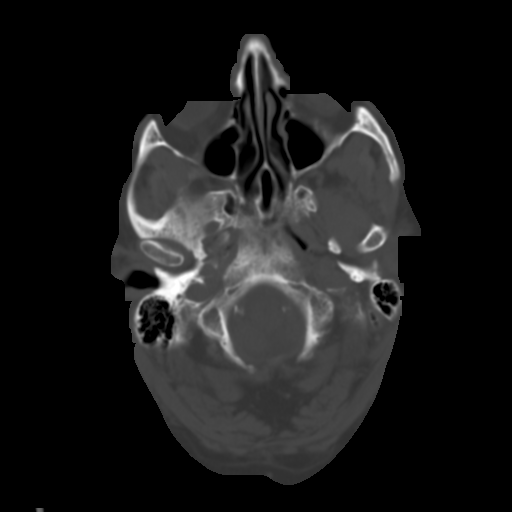
[im 6/31  brain]
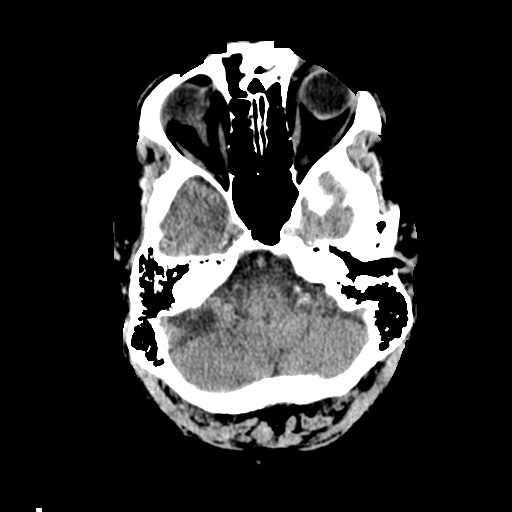
[im 9/31  brain]
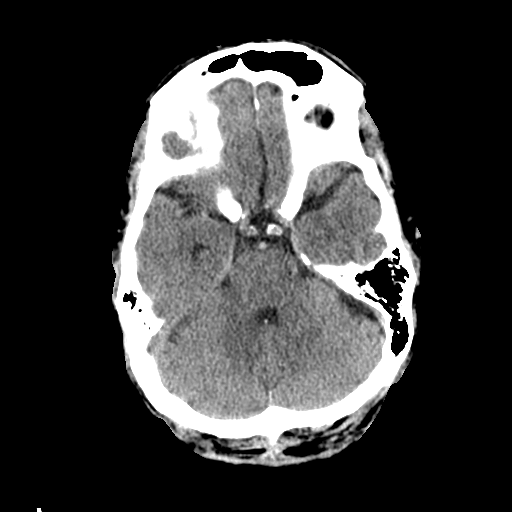
[im 12/31  brain]
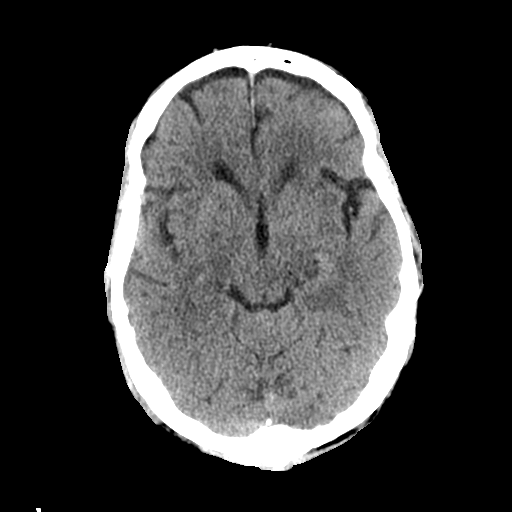
[im 16/31  brain]
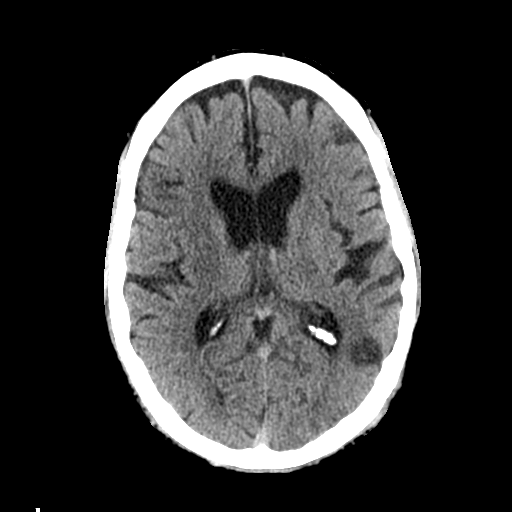
[im 16/31  bone]
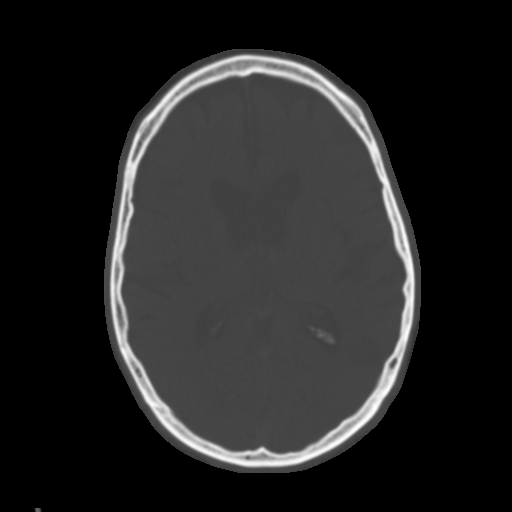
[im 19/31  brain]
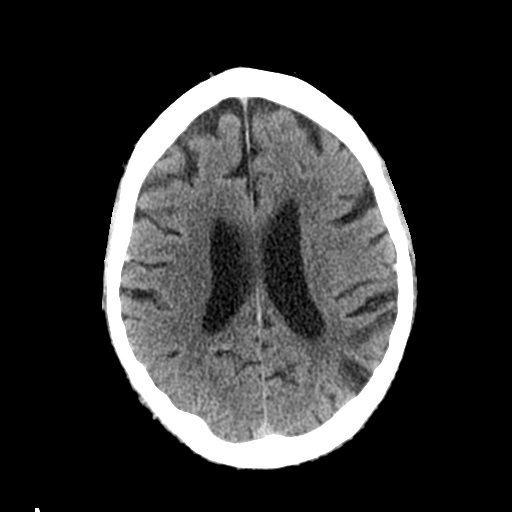
[im 22/31  brain]
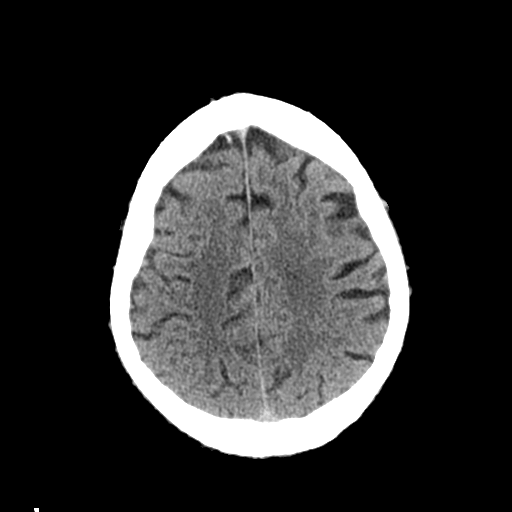
[im 25/31  brain]
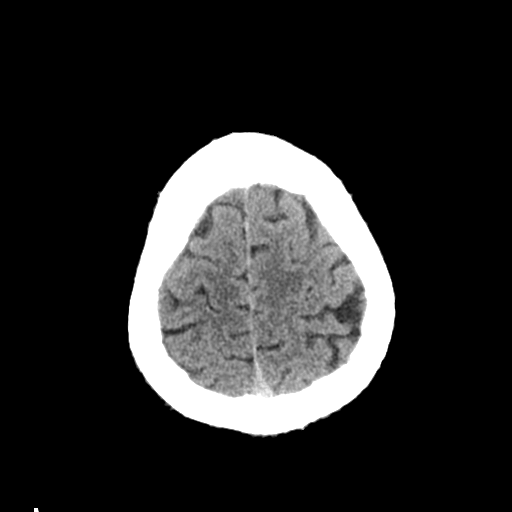
[im 28/31  brain]
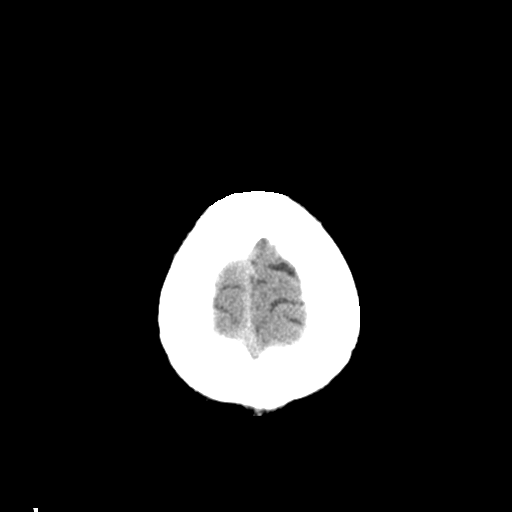
[im 28/31  bone]
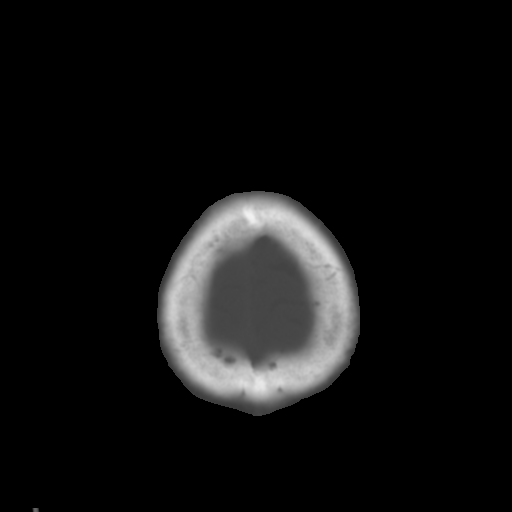

[Series 4: coronal soft tissue · coronal · 0.34mm/px · 3 of 71 slices shown]
[im 24/71  brain]
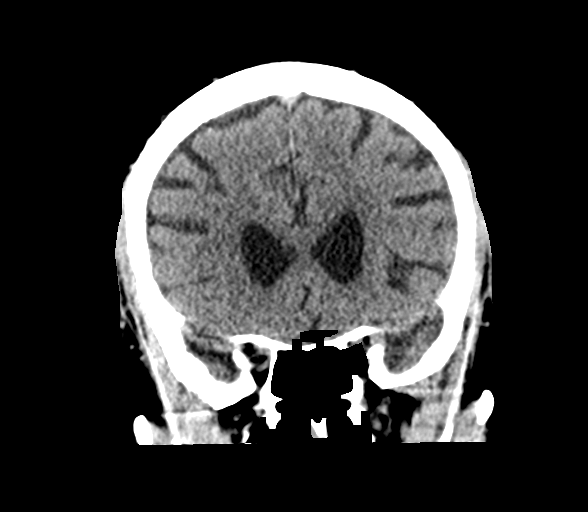
[im 32/71  brain]
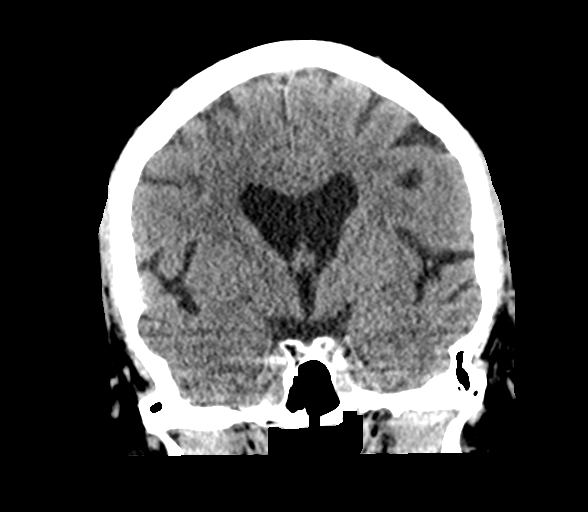
[im 39/71  brain]
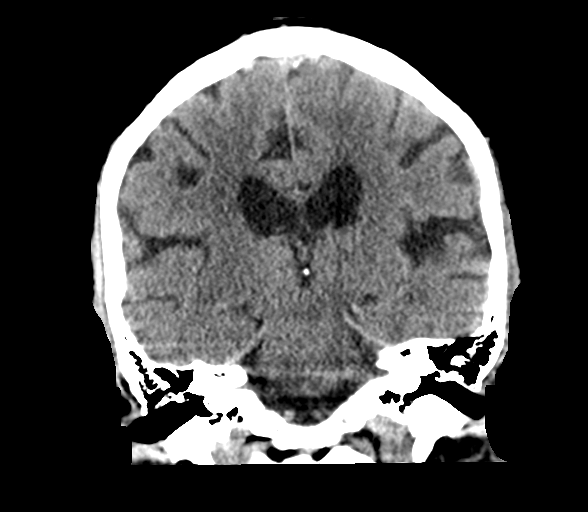

[Series 5: sagittal soft tissue · sagittal · 0.35mm/px · 3 of 59 slices shown]
[im 20/59  brain]
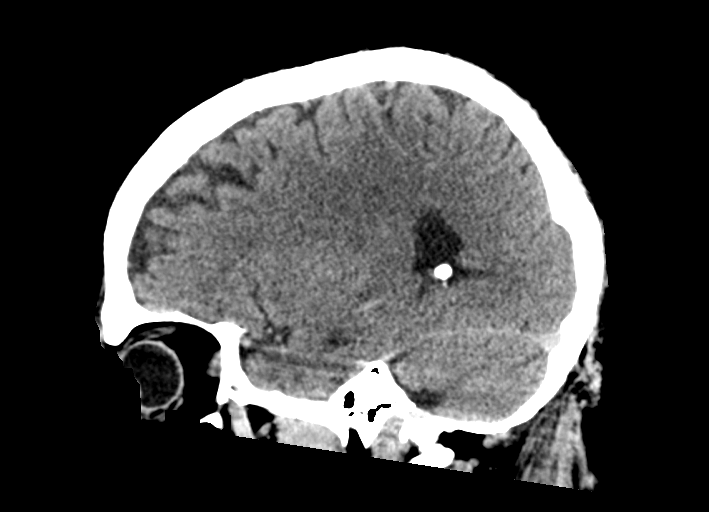
[im 30/59  brain]
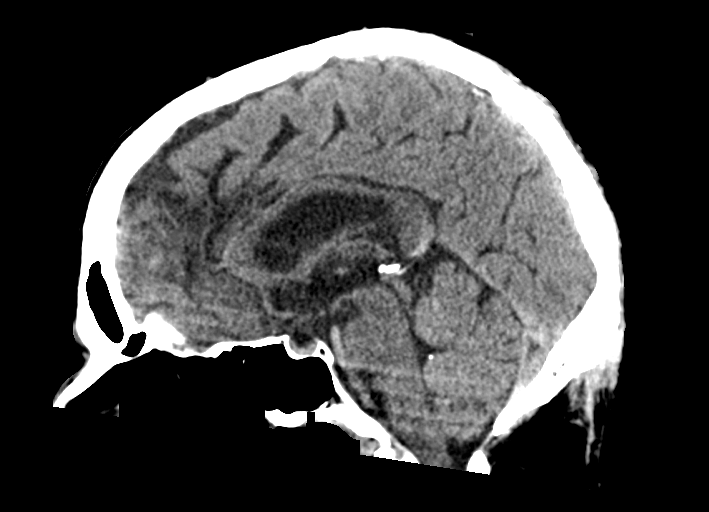
[im 39/59  brain]
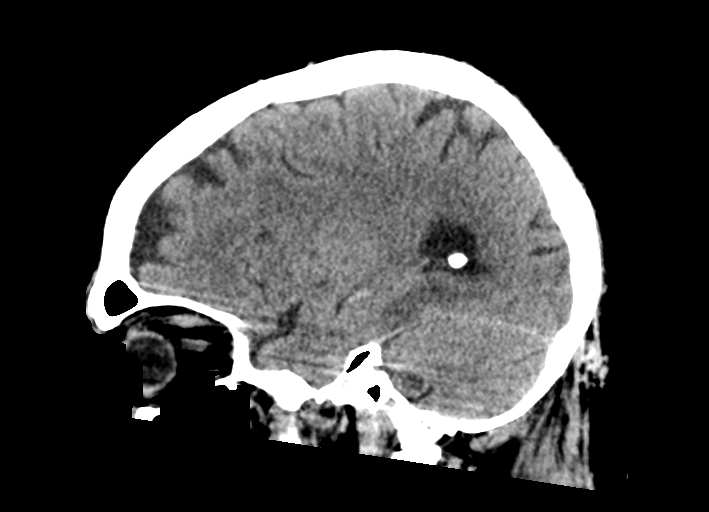

[15 of 47 positions shown; findings below may reference images not displayed]

FINDINGS: Brain:

Left the occipital encephalomalacia. No evidence of
large-territorial acute infarction. No parenchymal hemorrhage. No
mass lesion. No extra-axial collection.

No mass effect or midline shift. No hydrocephalus. Basilar cisterns
are patent.

Vascular: No hyperdense vessel. Atherosclerotic calcifications are
present within the cavernous internal carotid arteries.

Skull: No acute fracture or focal lesion.

Sinuses/Orbits: Paranasal sinuses and mastoid air cells are clear.
The orbits are unremarkable.

Other: None.
IMPRESSION: No acute intracranial abnormality.

## 2020-03-15 MED ORDER — METOCLOPRAMIDE HCL 5 MG/ML IJ SOLN
10.0000 mg | Freq: Once | INTRAMUSCULAR | Status: AC
  Administered 2020-03-15: 10 mg via INTRAVENOUS
  Filled 2020-03-15: qty 2

## 2020-03-15 MED ORDER — SODIUM CHLORIDE 0.9 % IV SOLN
100.0000 mL/h | INTRAVENOUS | Status: DC
  Administered 2020-03-15 – 2020-03-16 (×2): 100 mL/h via INTRAVENOUS

## 2020-03-15 MED ORDER — PROCHLORPERAZINE EDISYLATE 10 MG/2ML IJ SOLN
5.0000 mg | Freq: Once | INTRAMUSCULAR | Status: AC
  Administered 2020-03-15: 5 mg via INTRAVENOUS
  Filled 2020-03-15: qty 2

## 2020-03-15 MED ORDER — DIPHENHYDRAMINE HCL 50 MG/ML IJ SOLN
25.0000 mg | Freq: Once | INTRAMUSCULAR | Status: AC
  Administered 2020-03-15: 25 mg via INTRAVENOUS
  Filled 2020-03-15: qty 1

## 2020-03-15 MED ORDER — SODIUM CHLORIDE 0.9 % IV BOLUS
500.0000 mL | Freq: Once | INTRAVENOUS | Status: AC
  Administered 2020-03-15: 500 mL via INTRAVENOUS

## 2020-03-15 MED ORDER — LORAZEPAM 2 MG/ML IJ SOLN
2.0000 mg | Freq: Once | INTRAMUSCULAR | Status: AC
  Administered 2020-03-15: 2 mg via INTRAVENOUS

## 2020-03-15 MED ORDER — KETOROLAC TROMETHAMINE 15 MG/ML IJ SOLN
15.0000 mg | Freq: Once | INTRAMUSCULAR | Status: AC
  Administered 2020-03-15: 15 mg via INTRAVENOUS
  Filled 2020-03-15: qty 1

## 2020-03-15 NOTE — ED Triage Notes (Signed)
Patient complains of a headache that started 2 hours ago, also stated the same thing happened yesterday. Denies issues with balance, denies weakness, no slurred speech. Alert and oriented x4. Unable to tell me the president or previous president.

## 2020-03-15 NOTE — ED Notes (Signed)
Report called to Detar North, RN at [facility] ED

## 2020-03-15 NOTE — ED Notes (Signed)
Pt bib [facility] transport.  Pt A&Ox4, c/o left frontal headache 4/10. Denies any other complaints at this time.  NIHSS=0.

## 2020-03-15 NOTE — Progress Notes (Signed)
Failed attempt at MRI. Patient terminated the procedure and refused to continue due to claustrophobia. Patient refused medication and stated, "there is no way I can do this."

## 2020-03-15 NOTE — ED Notes (Signed)
Patient ambulated to the restroom with a steady gait in no distress.

## 2020-03-15 NOTE — ED Notes (Signed)
[facility] present to pick up pt 

## 2020-03-15 NOTE — ED Notes (Signed)
[facility] called for pt transfer to [facility] ED

## 2020-03-15 NOTE — ED Notes (Signed)
Pt transported to CT ?

## 2020-03-15 NOTE — ED Provider Notes (Signed)
[facility]-EMERGENCY DEPT Provider Note   CSN: [id number] Arrival date & time: 03/15/20  1850     History No chief complaint on file.   [name] is a 70 y.o. male.  HPI Patient presents with concern for headache, memory loss.  He is generally well, states that in regards to his memory, he is regarded with friends as the one with good memory.  Twice with past 2 days including currently the patient has had frontal headache, severe, persistent, focal with associated difficulty with recall.  Yesterday's event was about 2 hours long, occurred without clear precipitant, improved without clear intervention.  Today, about 2 hours ago he developed similar headache, with similar memory/recall issues.  He specifically denies vision changes, weakness, discoordination, ambulatory changes. He does have a history of headaches, in the distant past.    Past Medical History:  Diagnosis Date  . Hypertension     There are no problems to display for this patient.   History reviewed. No pertinent surgical history.     No family history on file.  Social History   Tobacco Use  . Smoking status: Former Smoker  Substance Use Topics  . Alcohol use: Yes    Comment: occasionally   . Drug use: No    Home Medications Prior to Admission medications   Medication Sig Start Date End Date Taking? Authorizing Provider  amoxicillin-clavulanate (AUGMENTIN) 875-125 MG per tablet Take 1 tablet by mouth every 12 (twelve) hours. 07/27/14   Lawyer, Cristal Deer, PA-C  aspirin 325 MG EC tablet Take 650 mg by mouth every 6 (six) hours as needed for pain.    [provider]  ibuprofen (ADVIL,MOTRIN) 800 MG tablet Take 1 tablet (800 mg total) by mouth every 8 (eight) hours as needed. 07/27/14   Charlestine Night, PA-C    Allergies    Patient has no known allergies.  Review of Systems   Review of Systems  Constitutional:       Per HPI, otherwise negative  HENT:       Per  HPI, otherwise negative  Respiratory:       Per HPI, otherwise negative  Cardiovascular:       Per HPI, otherwise negative  Gastrointestinal: Negative for vomiting.  Endocrine:       Negative aside from HPI  Genitourinary:       Neg aside from HPI   Musculoskeletal:       Per HPI, otherwise negative  Skin: Negative.   Neurological: Positive for headaches. Negative for syncope.    Physical Exam Updated Vital Signs BP (!) 147/84   Pulse 99   Resp 17   Ht 5\' 10"  (1.778 m)   Wt 95.3 kg   SpO2 100%   BMI 30.13 kg/m   Physical Exam Vitals and nursing note reviewed.  Constitutional:      General: He is not in acute distress.    Appearance: He is well-developed.  HENT:     Head: Normocephalic and atraumatic.  Eyes:     Extraocular Movements: EOM normal.     Conjunctiva/sclera: Conjunctivae normal.  Cardiovascular:     Rate and Rhythm: Normal rate and regular rhythm.  Pulmonary:     Effort: Pulmonary effort is normal. No respiratory distress.     Breath sounds: No stridor.  Abdominal:     General: There is no distension.  Musculoskeletal:        General: No edema.  Skin:    General: Skin is  warm and dry.  Neurological:     Mental Status: He is alert and oriented to person, place, and time.     Coordination: Coordination normal.     Gait: Gait normal.     Comments: Patient has slight difficulty with recalling specific items such as most recent president, but is otherwise oriented appropriately.  Psychiatric:        Mood and Affect: Mood and affect normal.     ED Results / Procedures / Treatments   Labs (all labs ordered are listed, but only abnormal results are displayed) Labs Reviewed  CBC - Abnormal; Notable for the following components:      Result Value   Hemoglobin 17.9 (*)    HCT 52.4 (*)    All other components within normal limits  COMPREHENSIVE METABOLIC PANEL - Abnormal; Notable for the following components:   Chloride 94 (*)    Glucose, Bld 137  (*)    Calcium 10.9 (*)    Total Protein 8.8 (*)    Total Bilirubin 1.4 (*)    All other components within normal limits  URINALYSIS, ROUTINE W REFLEX MICROSCOPIC - Abnormal; Notable for the following components:   Hgb urine dipstick SMALL (*)    All other components within normal limits  I-STAT CHEM 8, ED - Abnormal; Notable for the following components:   Chloride 96 (*)    Glucose, Bld 141 (*)    Hemoglobin 18.0 (*)    HCT 53.0 (*)    All other components within normal limits  CBG MONITORING, ED - Abnormal; Notable for the following components:   Glucose-Capillary 120 (*)    All other components within normal limits  RESP PANEL BY RT-PCR (FLU A&B, COVID) ARPGX2  PROTIME-INR  APTT  DIFFERENTIAL  RAPID URINE DRUG SCREEN, HOSP PERFORMED    EKG EKG Interpretation  Date/Time:  Friday March 15 2020 19:45:43 EST Ventricular Rate:  107 PR Interval:    QRS Duration: 110 QT Interval:  349 QTC Calculation: 466 R Axis:   -74 Text Interpretation: Sinus arrhythmia Ventricular premature complex Prominent P waves, nondiagnostic Inferior infarct, old Baseline wander in lead(s) V3 Abnormal ECG Confirmed by Gerhard Munch [phone]) on 03/15/2020 8:16:25 PM   Radiology CT HEAD WO CONTRAST  Result Date: 03/15/2020 CLINICAL DATA:  Headache.  Mental status change, cause unknown. EXAM: CT HEAD WITHOUT CONTRAST TECHNIQUE: Contiguous axial images were obtained from the base of the skull through the vertex without intravenous contrast. COMPARISON:  None. FINDINGS: Brain: Left the occipital encephalomalacia. No evidence of large-territorial acute infarction. No parenchymal hemorrhage. No mass lesion. No extra-axial collection. No mass effect or midline shift. No hydrocephalus. Basilar cisterns are patent. Vascular: No hyperdense vessel. Atherosclerotic calcifications are present within the cavernous internal carotid arteries. Skull: No acute fracture or focal lesion. Sinuses/Orbits: Paranasal sinuses  and mastoid air cells are clear. The orbits are unremarkable. Other: None. IMPRESSION: No acute intracranial abnormality. Electronically Signed   By: Tish Frederickson M.D.   On: 03/15/2020 19:49    Procedures Procedures   Medications Ordered in ED Medications  sodium chloride 0.9 % bolus 500 mL (500 mLs Intravenous New Bag/Given 03/15/20 2010)    Followed by  0.9 %  sodium chloride infusion (has no administration in time range)  prochlorperazine (COMPAZINE) injection 5 mg (5 mg Intravenous Given 03/15/20 2010)    ED Course  I have reviewed the triage vital signs and the nursing notes.  Pertinent labs & imaging results that were available during my  care of the patient were reviewed by me and considered in my medical decision making (see chart for details).   On repeat exam patient is in similar condition. Ambulation is unremarkable.  There is still persistent delay in word finding.  Patient notes his headache is better, though it remains present in the frontal area.  No actual sinus drainage, though he notes it is a pressure-like sensation.   9:41 PM Patient attempted MRI, but was unable to complete it. He is now accompanied by his daughter.  10:49 PM Have discussed the patient's case with our neurology colleague (Dr. Iver Nestle), and emergency physician (Dr. Delford Field) at our affiliated facility.  Patient has been excepted for transfer for MRI. Again discussed ongoing considerations for the patient's headache, memory loss.  Primary considerations include hypertensive urgency versus complex migraine, though with consideration of stroke versus dural vein thrombosis, patient requires additional studies. Thus far for his headache he has received fluids, Compazine, and is currently receiving Toradol, Benadryl, Reglan. Blood pressure remains somewhat elevated, will be monitored pending MRI results.   Final Clinical Impression(s) / ED Diagnoses Final diagnoses:  Bad headache  Memory loss      Gerhard Munch, MD 03/15/20 2257

## 2020-03-15 NOTE — ED Notes (Signed)
[facility] called and given report.  

## 2020-03-16 ENCOUNTER — Emergency Department (HOSPITAL_COMMUNITY): Payer: Medicare HMO

## 2020-03-16 ENCOUNTER — Encounter (HOSPITAL_COMMUNITY): Payer: Self-pay | Admitting: Internal Medicine

## 2020-03-16 ENCOUNTER — Observation Stay (HOSPITAL_BASED_OUTPATIENT_CLINIC_OR_DEPARTMENT_OTHER): Payer: Medicare HMO

## 2020-03-16 DIAGNOSIS — I63532 Cerebral infarction due to unspecified occlusion or stenosis of left posterior cerebral artery: Secondary | ICD-10-CM

## 2020-03-16 DIAGNOSIS — D751 Secondary polycythemia: Secondary | ICD-10-CM | POA: Diagnosis not present

## 2020-03-16 DIAGNOSIS — E119 Type 2 diabetes mellitus without complications: Secondary | ICD-10-CM | POA: Diagnosis not present

## 2020-03-16 DIAGNOSIS — I10 Essential (primary) hypertension: Secondary | ICD-10-CM | POA: Diagnosis not present

## 2020-03-16 LAB — GLUCOSE, CAPILLARY
Glucose-Capillary: 112 mg/dL — ABNORMAL HIGH (ref 70–99)
Glucose-Capillary: 157 mg/dL — ABNORMAL HIGH (ref 70–99)
Glucose-Capillary: 109 mg/dL — ABNORMAL HIGH (ref 70–99)
Glucose-Capillary: 118 mg/dL — ABNORMAL HIGH (ref 70–99)

## 2020-03-16 LAB — LIPID PANEL
Cholesterol: 199 mg/dL (ref 0–200)
HDL: 56 mg/dL (ref >40)
LDL Cholesterol: 129 mg/dL — ABNORMAL HIGH (ref 0–99)
Total CHOL/HDL Ratio: 3.6 RATIO
Triglycerides: 71 mg/dL (ref <150)
VLDL: 14 mg/dL (ref 0–40)

## 2020-03-16 LAB — ECHOCARDIOGRAM COMPLETE BUBBLE STUDY
Area-P 1/2: 3.08 cm2
S' Lateral: 2.8 cm

## 2020-03-16 LAB — HEMOGLOBIN A1C
Hgb A1c MFr Bld: 6.5 % — ABNORMAL HIGH (ref 4.8–5.6)
Mean Plasma Glucose: 139.85 mg/dL

## 2020-03-16 LAB — HIV ANTIBODY (ROUTINE TESTING W REFLEX): HIV Screen 4th Generation wRfx: NONREACTIVE

## 2020-03-16 IMAGING — MR MR MRV HEAD WO/W CM
4 of 5 series · 19 of 48 positions shown · IV contrast (9.5 M GAD)
Comparison: MRI brain earlier same day

CLINICAL DATA: Mental status changes of unknown cause.

EXAM:
MR VENOGRAM HEAD WITHOUT AND WITH CONTRAST
TECHNIQUE: Angiographic images of the intracranial venous structures were
obtained using MRV technique without and with intravenous contrast.
CONTRAST:  9.5mL GADAVIST GADOBUTROL 1 MMOL/ML IV SOLN

[Series 3: MRV · coronal · 1.5mm · 0.43mm/px · 7 of 130 slices shown]
[im 1/130]
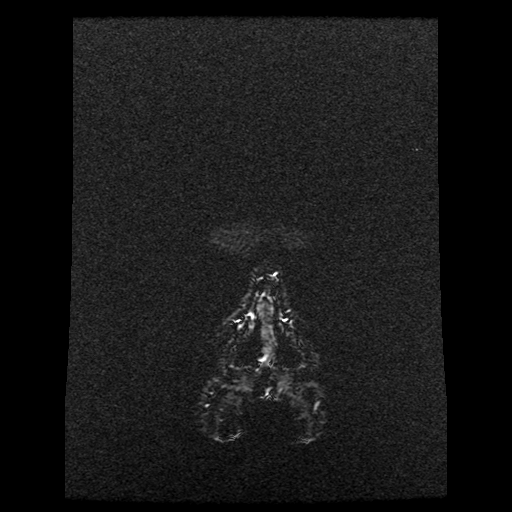
[im 22/130]
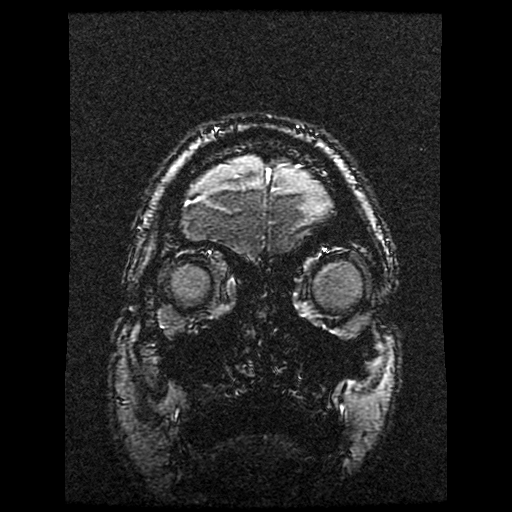
[im 44/130]
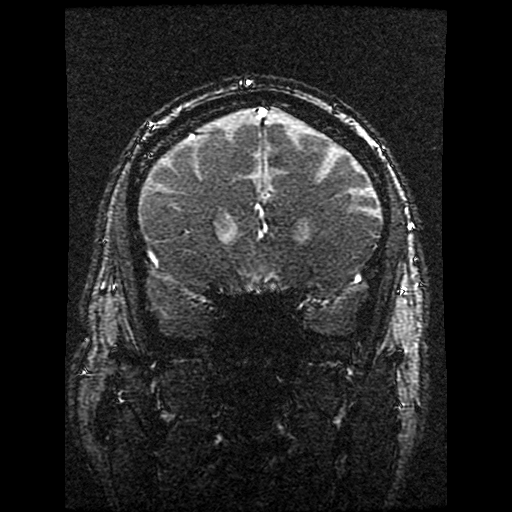
[im 65/130]
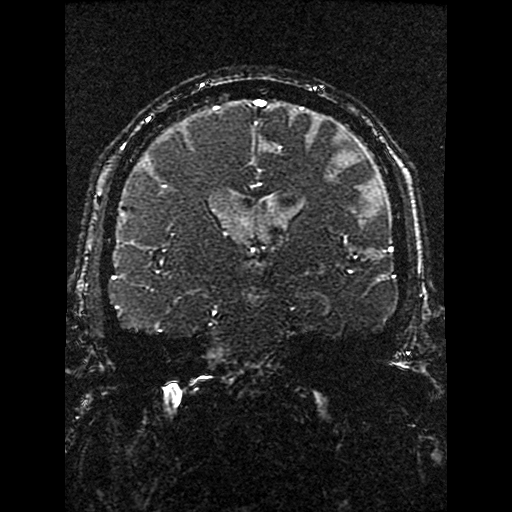
[im 87/130]
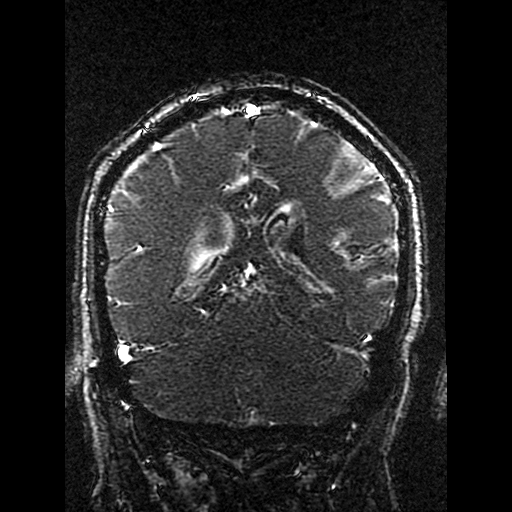
[im 108/130]
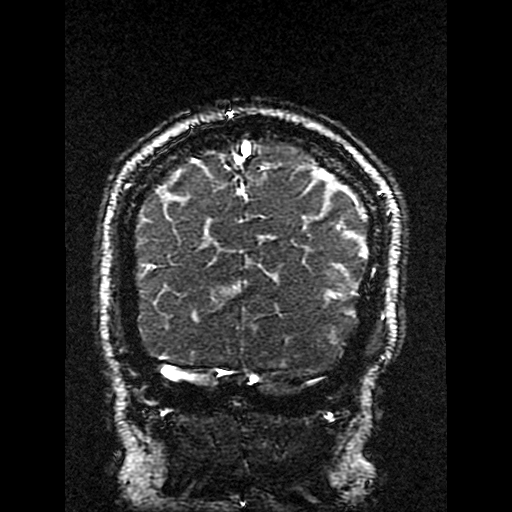
[im 130/130]
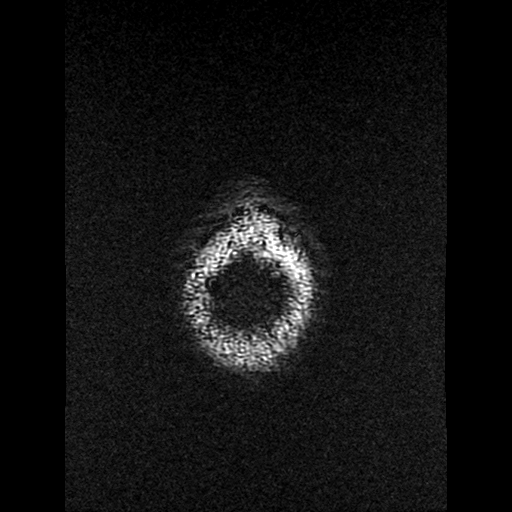

[Series 4: sag inhance (id) · sagittal · 1.8mm · 0.47mm/px · 6 of 311 slices shown]
[im 1/311]
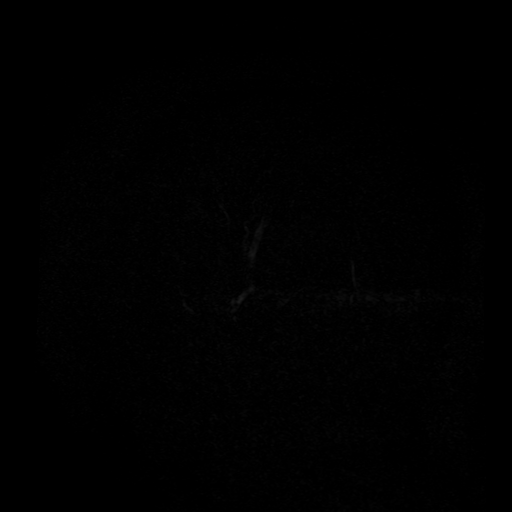
[im 48/311]
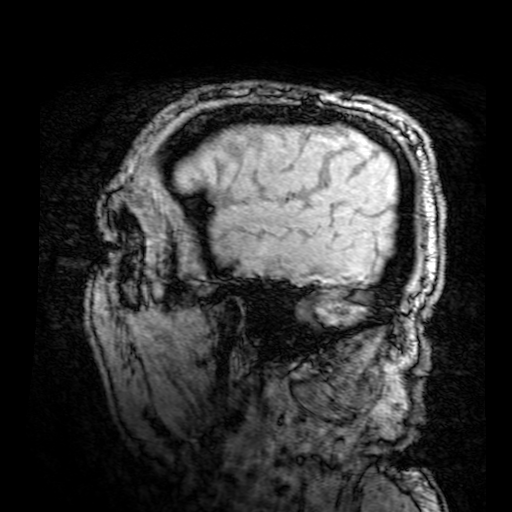
[im 96/311]
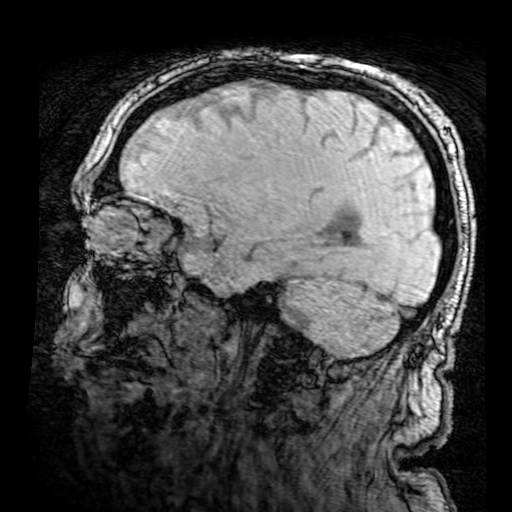
[im 144/311]
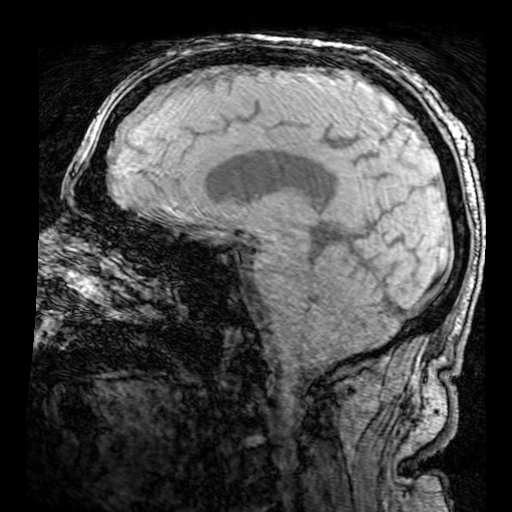
[im 167/311]
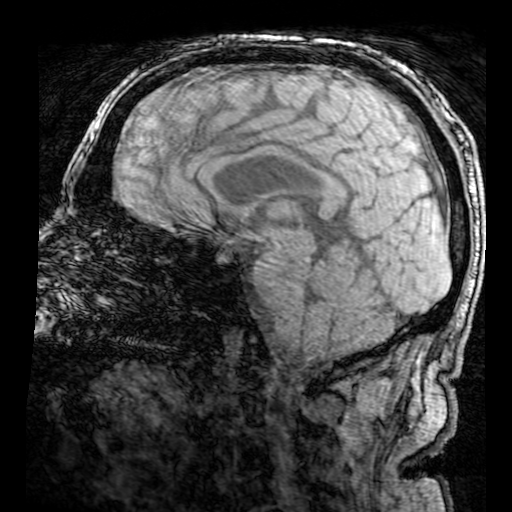
[im 263/311]
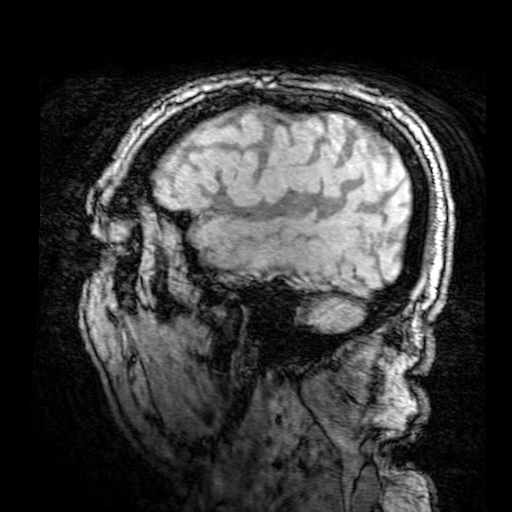

[Series 500: multiplanar reconstruction (mpr) · sagittal · 0.9mm · 0.47mm/px · 3 of 205 slices shown (1 of 2)]
[im 26/205]
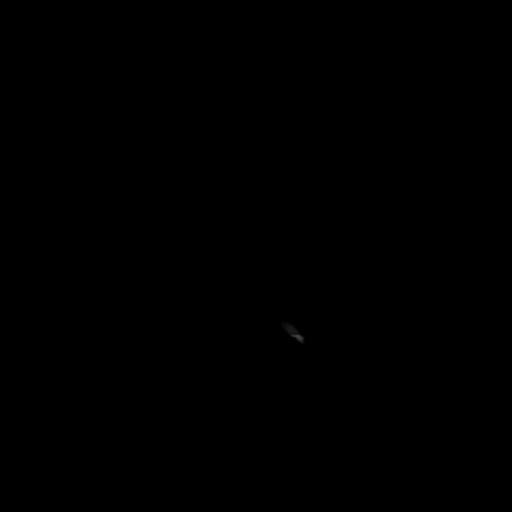
[im 103/205]
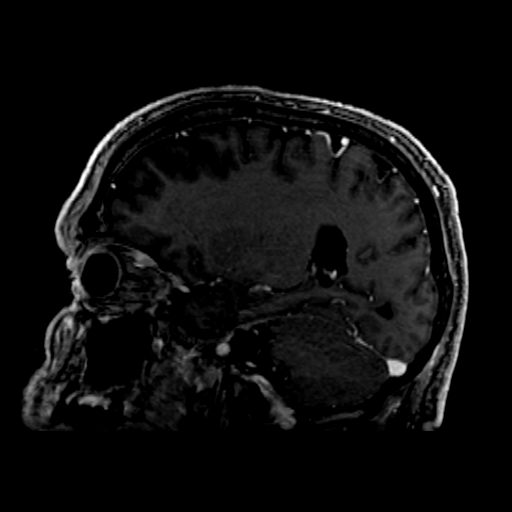
[im 179/205]
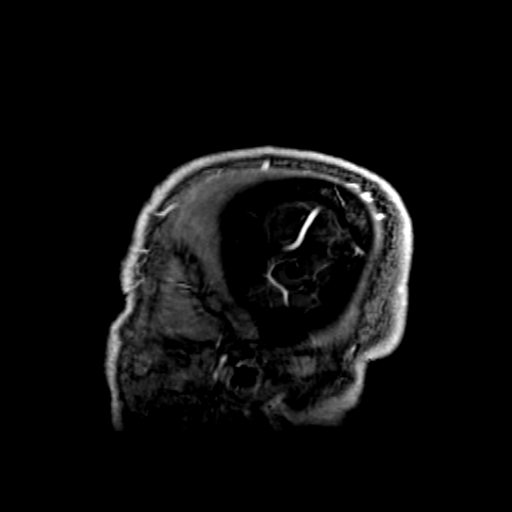

[Series 501: multiplanar reconstruction (mpr) · coronal · 0.9mm · 0.47mm/px · 3 of 255 slices shown (2 of 2)]
[im 26/255]
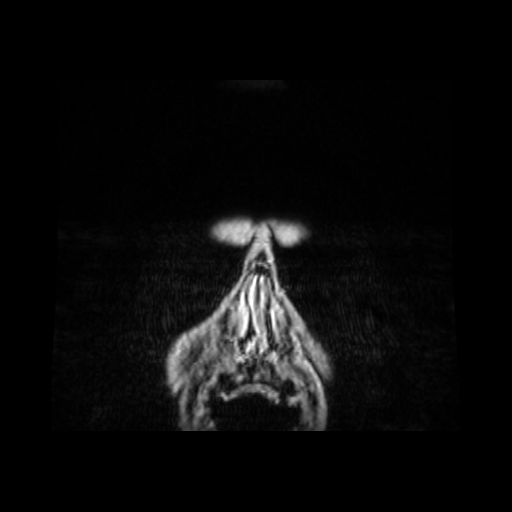
[im 128/255]
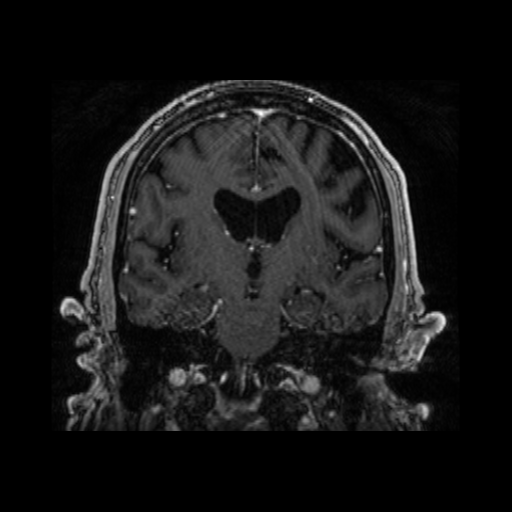
[im 229/255]
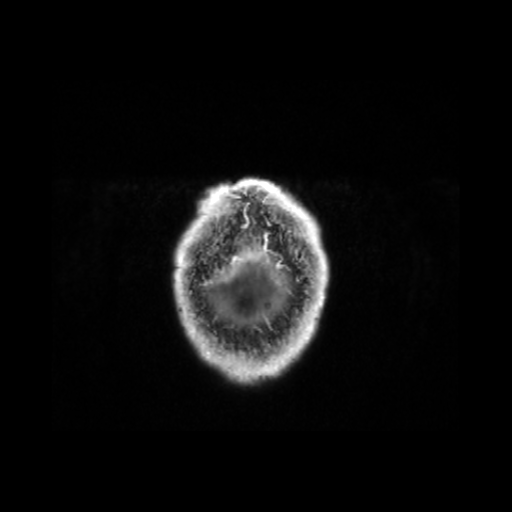

[19 of 48 positions shown; findings below may reference images not displayed]

FINDINGS: Intracranial venous structures are normal. Superior sagittal sinus
is normal. Transverse sinuses are normal. Sigmoid sinuses are
normal. Both jugular veins are patent. Deep veins are patent. Some
enhancement noted of the sulci in the left lateral occipital lobe
consistent with subacute infarction in that region. No enhancement
seen in the region of acute left PCA infarction.
IMPRESSION: 1. Normal intracranial MR venography.
2. Evidence of subacute infarction in the left lateral occipital
lobe, with cortical enhancement. No enhancement seen in the region
of acute infarction. See results of previous MRI.

## 2020-03-16 IMAGING — MR MR HEAD W/O CM
7 of 11 series · 25 of 48 positions shown · non-contrast
Comparison: None.

CLINICAL DATA: Mental status changes.  Frontal headache.

EXAM:
MRI HEAD WITHOUT CONTRAST
TECHNIQUE: Multiplanar, multiecho pulse sequences of the brain and surrounding
structures were obtained.

[Series 3: DWI · axial · 3.0mm · 0.94mm/px · z∈[-111,+46]mm · 7 of 110 slices shown (1 of 2)]
[im 1/110]
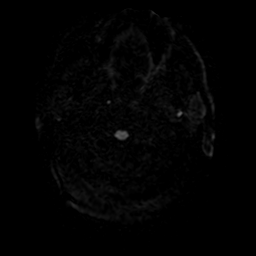
[im 19/110]
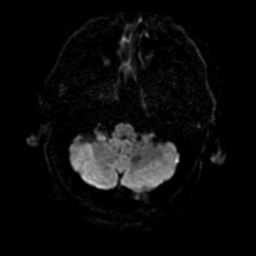
[im 37/110]
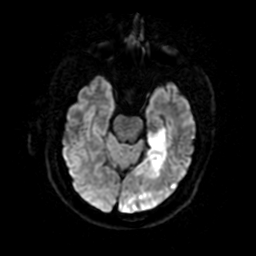
[im 55/110]
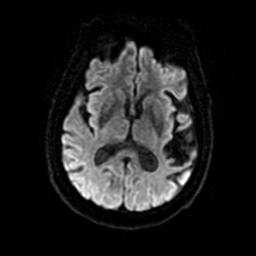
[im 73/110]
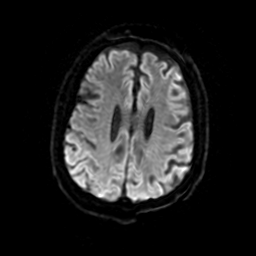
[im 91/110]
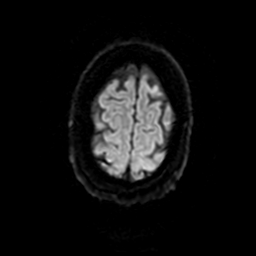
[im 110/110]
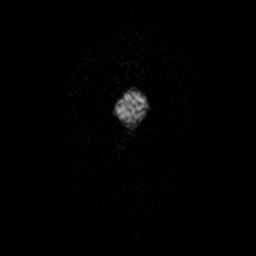

[Series 4: DWI · coronal · 4.0mm · 0.94mm/px · 6 of 76 slices shown (2 of 2)]
[im 1/76]
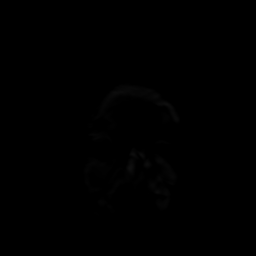
[im 16/76]
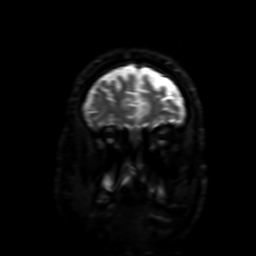
[im 31/76]
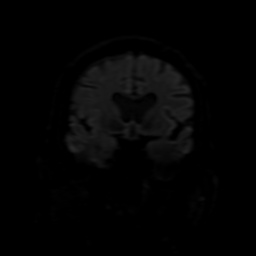
[im 46/76]
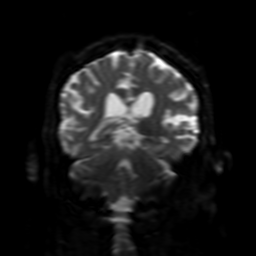
[im 61/76]
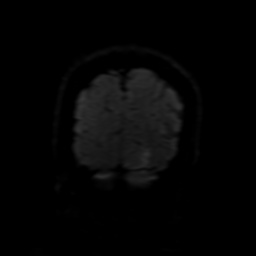
[im 76/76]
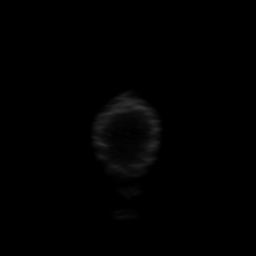

[Series 5: FLAIR · sagittal · 5.0mm · 0.23mm/px · 2 of 26 slices shown (1 of 2)]
[im 1/26]
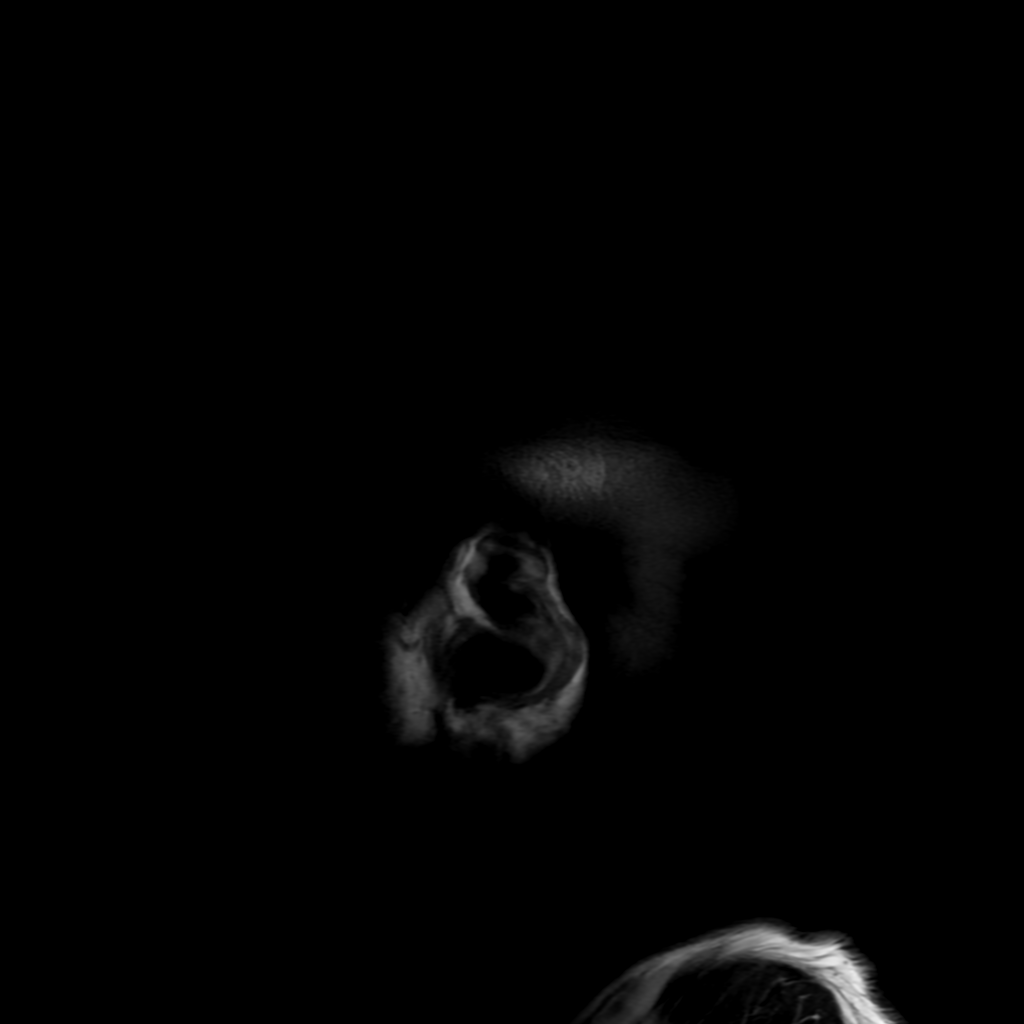
[im 26/26]
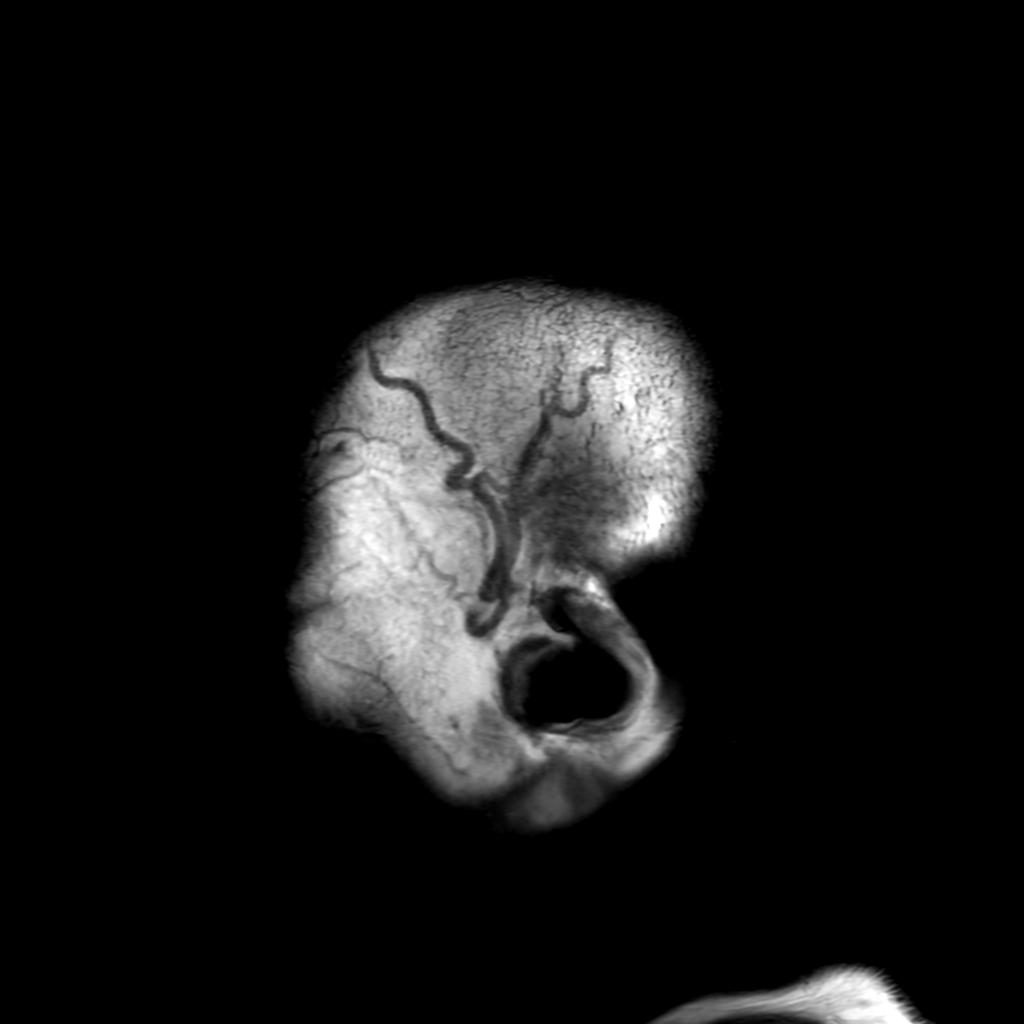

[Series 6: T2 · axial · 5.0mm · 0.23mm/px · 1 of 28 slices shown]
[im 1/28]
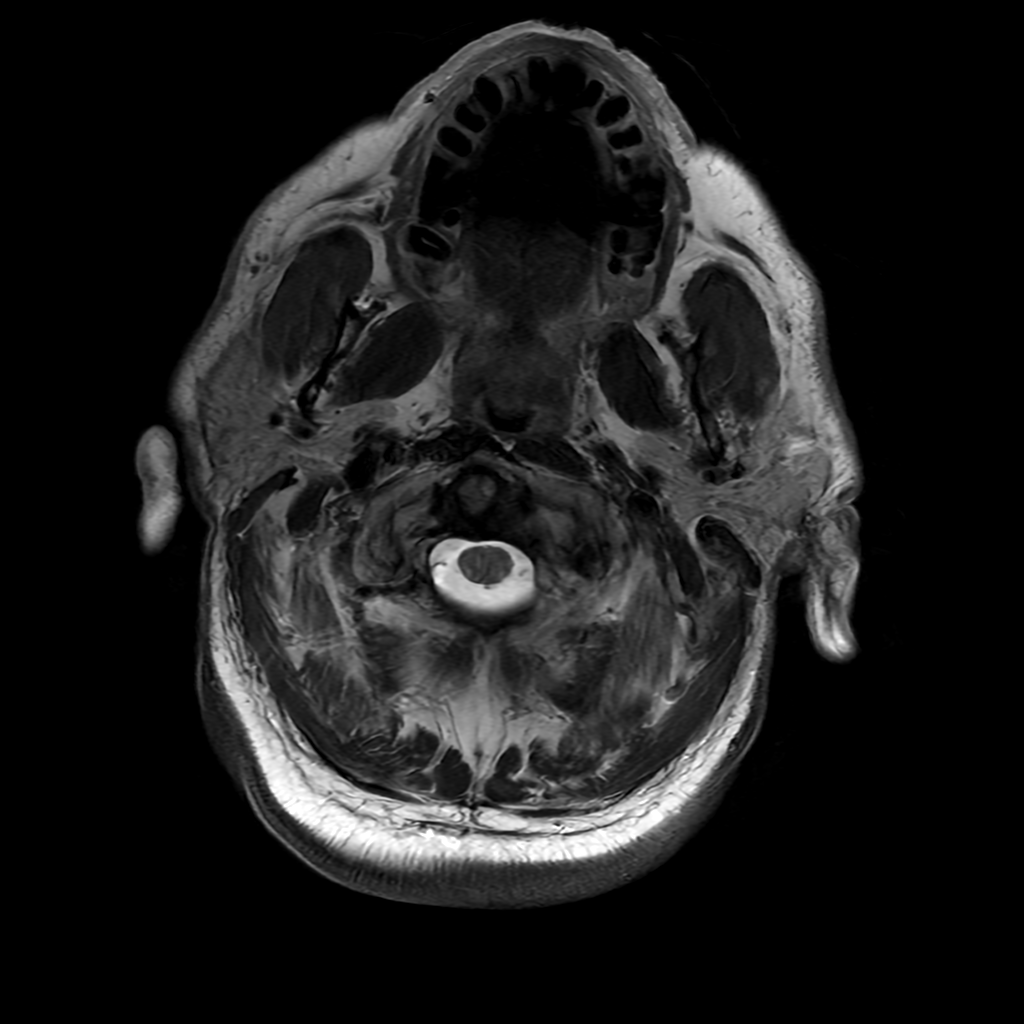

[Series 7: FLAIR · axial · 3.0mm · 0.45mm/px · z∈[-106,+52]mm · 2 of 28 slices shown (2 of 2)]
[im 1/28]
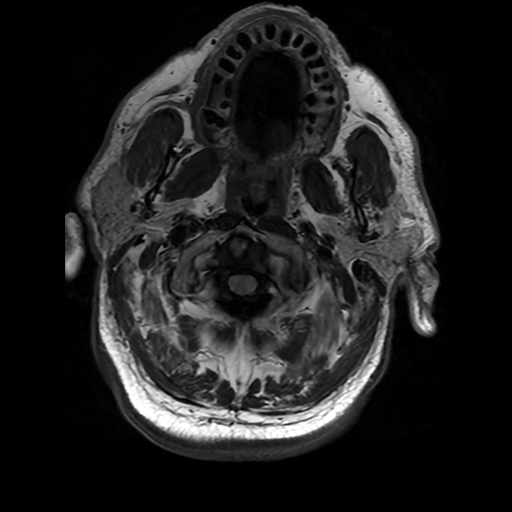
[im 28/28]
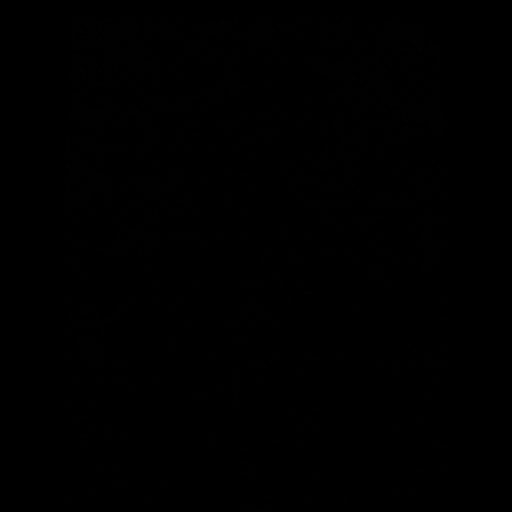

[Series 350: ADC · axial · 3.0mm · 0.94mm/px · z∈[-111,+46]mm · 4 of 55 slices shown (1 of 2)]
[im 1/55]
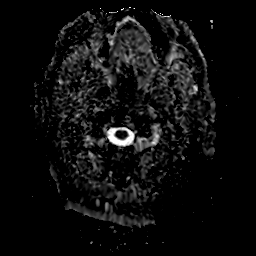
[im 19/55]
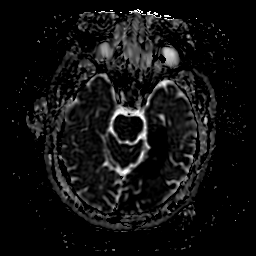
[im 37/55]
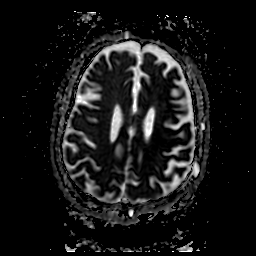
[im 55/55]
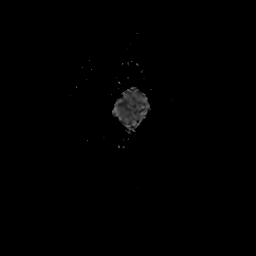

[Series 450: ADC · coronal · 4.0mm · 0.94mm/px · 3 of 38 slices shown (2 of 2)]
[im 1/38]
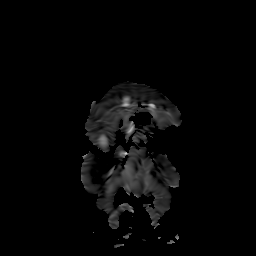
[im 19/38]
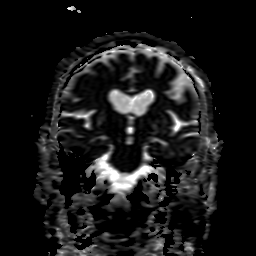
[im 38/38]
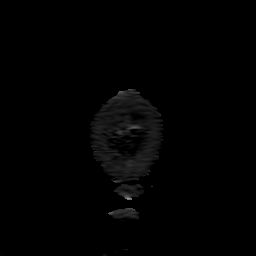

[25 of 48 positions shown; findings below may reference images not displayed]

FINDINGS: MR HEAD FINDINGS

Brain: Acute infarction in the left PCA territory affecting the
posteromedial temporal lobe and occipital lobe. Minimal petechial
blood products. No frank hematoma. No other acute infarction
identified. There is an old infarction at the left temporoparietal
junction with atrophy and encephalomalacia. No sign of widespread
small-vessel disease. No hydrocephalus, mass or extra-axial
collection.

Vascular: Major vessels at the base of the brain show flow. However,
there may be abnormal flow in the left vertebral artery.

Skull and upper cervical spine: Negative

Sinuses/Orbits: No significant sinus disease.  Orbits negative.

Other: None
IMPRESSION: 1. Acute infarction in the left PCA territory affecting the left
posteromedial temporal lobe and occipital lobe. Minimal petechial
blood products. No frank hematoma.
2. Old infarction at the left temporoparietal junction.
3. Question abnormal flow in the left vertebral artery.

## 2020-03-16 IMAGING — CT CT ANGIO NECK
2 of 7 series · 8 of 33 positions shown · IV contrast (APPLIED)
Comparison: MRI studies earlier same day.  CT late yesterday.

CLINICAL DATA: Mental status changes. Frontal headache. Left PCA
acute infarction.

EXAM:
CT ANGIOGRAPHY HEAD AND NECK
TECHNIQUE: Multidetector CT imaging of the head and neck was performed using
the standard protocol during bolus administration of intravenous
contrast. Multiplanar CT image reconstructions and MIPs were
obtained to evaluate the vascular anatomy. Carotid stenosis
measurements (when applicable) are obtained utilizing NASCET
criteria, using the distal internal carotid diameter as the
denominator.
CONTRAST:  83mL OMNIPAQUE IOHEXOL 350 MG/ML SOLN

[Series 5: cta neck/head · axial · 0.64mm/px · z∈[-154,-36]mm · 2 of 179 slices shown]
[im 60/179  soft-tissue]
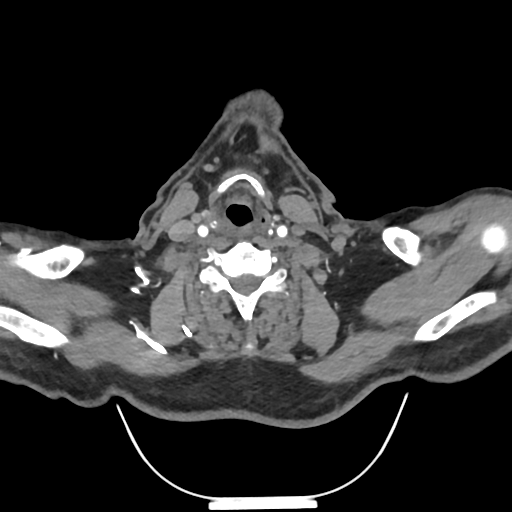
[im 119/179  soft-tissue]
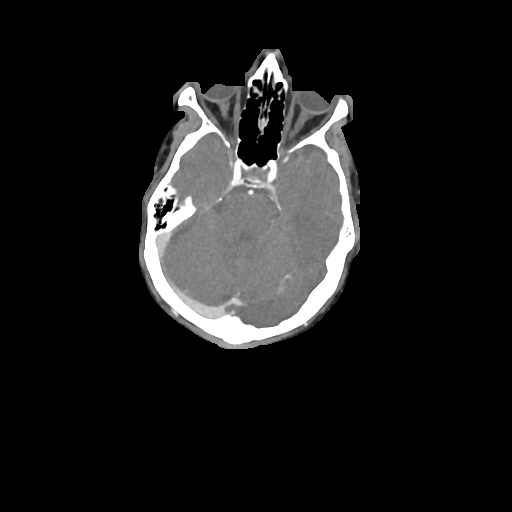

[Series 7: ax thins · axial · 0.49mm/px · z∈[-222,+31]mm · 6 of 355 slices shown]
[im 51/355  soft-tissue]
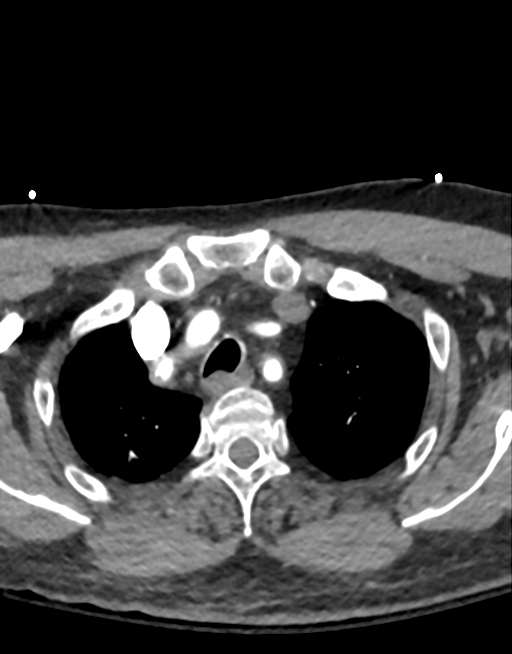
[im 102/355  bone]
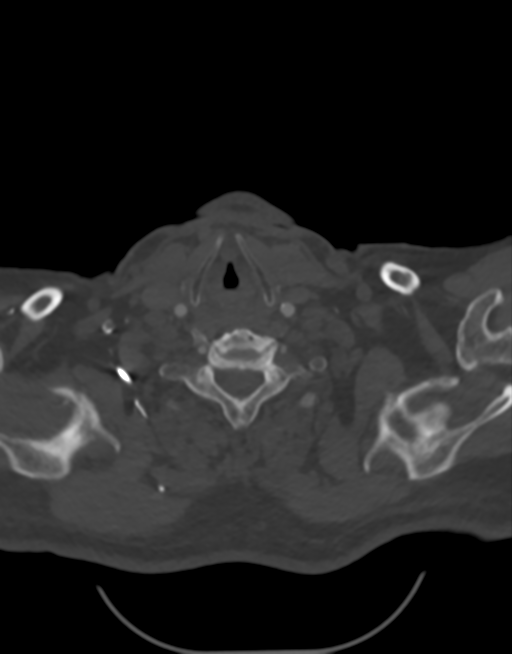
[im 152/355  soft-tissue]
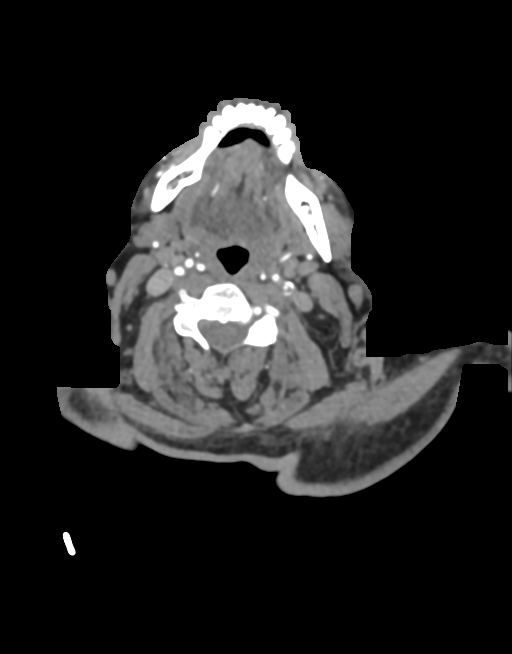
[im 203/355  bone]
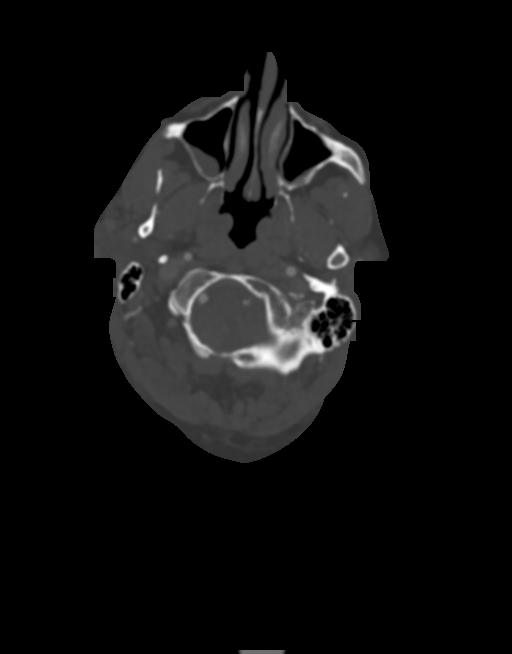
[im 253/355  soft-tissue]
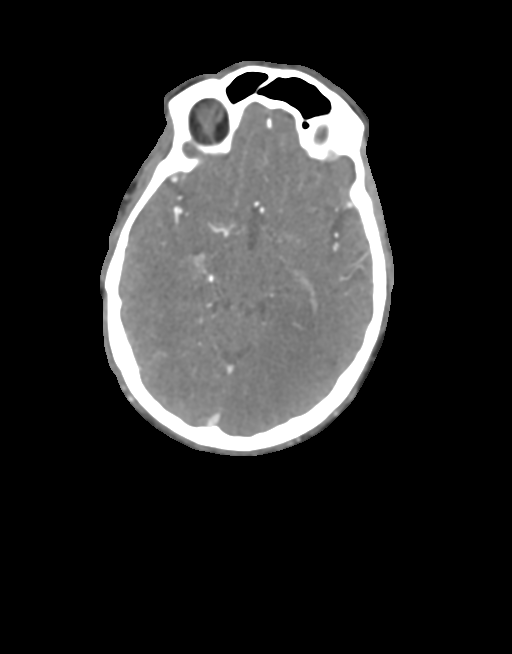
[im 304/355  bone]
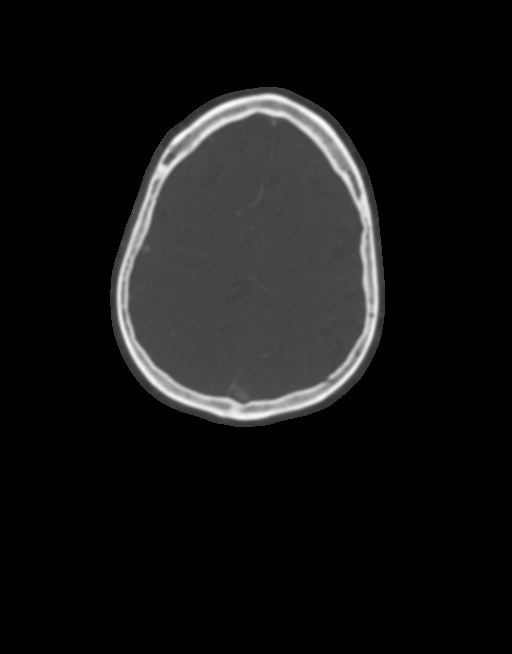

[8 of 33 positions shown; findings below may reference images not displayed]

FINDINGS: CTA NECK FINDINGS

Aortic arch: Aortic atherosclerosis. Branching pattern is normal
without origin stenosis.

Right carotid system: Common carotid artery widely patent to the
bifurcation. Calcified plaque at the carotid bifurcation and ICA
bulb. Minimal diameter of the ICA is the same as the more distal
cervical ICA, therefore no stenosis.

Left carotid system: Common carotid artery widely patent to the
bifurcation. Calcified plaque at carotid bifurcation and ICA bulb.
Minimal diameter in the distal bulb 1.8 mm. Compared to a more
distal cervical ICA diameter of 4.5 mm, this indicates a 60%
stenosis.

Vertebral arteries: Mild atherosclerotic change at both vertebral
artery origins with stenosis estimated at 30%. Beyond that, both
vertebral arteries are patent through the cervical region to the
foramen magnum.

Skeleton: Ordinary cervical spondylosis and facet arthritis.

Other neck: No mass or lymphadenopathy.

Upper chest: Negative

Review of the MIP images confirms the above findings

CTA HEAD FINDINGS

Anterior circulation: Both internal carotid arteries are patent
through the skull base and siphon regions. Ordinary atherosclerotic
calcification in the carotid siphons but no stenosis greater than
30%. The anterior and middle cerebral vessels are patent. 30%
stenosis of the distal right M1 segment. 50% stenosis of the left M1
segment. Tiny posterior communicating artery on the left.

Posterior circulation: Dominant right vertebral artery is widely
patent through the foramen magnum. There is atherosclerotic disease
in the V4 segment with 50% stenosis. Beyond that, the vessel is
patent to the basilar. Non dominant left vertebral artery is patent
through the foramen magnum and supplies PICA. Severe stenosis distal
to that but with some antegrade flow. No basilar stenosis. Superior
cerebellar arteries show flow. Right posterior cerebral artery shows
flow, with a moderate stenosis at the P2 P3 junction. Left
posterior cerebral artery is occluded immediately beyond its origin.

Venous sinuses: Patent and normal.

Anatomic variants: None significant.
IMPRESSION: 1. Aortic atherosclerosis.
2. Atherosclerotic disease at both carotid bifurcations. No
measurable stenosis on the right. 60% stenosis of the distal bulb on
the left.
3. 30% stenosis of both vertebral artery origins. 50% stenosis of
the right V4 segment. Severe stenosis of the left vertebral artery
just beyond PICA, but with some antegrade flow.
4. Left posterior cerebral artery occluded immediately beyond its
origin. Stenoses in the right posterior cerebral artery, moderate at
the P2 P3 junction.
5. 30% stenosis of the distal right M1 segment. 50% stenosis of the
left M1 segment.

Aortic Atherosclerosis ([HZ]-[HZ]).

## 2020-03-16 MED ORDER — LORAZEPAM 2 MG/ML IJ SOLN
2.0000 mg | Freq: Once | INTRAMUSCULAR | Status: AC
  Administered 2020-03-16: 2 mg via INTRAVENOUS
  Filled 2020-03-16: qty 1

## 2020-03-16 MED ORDER — FOLIC ACID 1 MG PO TABS
1.0000 mg | ORAL_TABLET | Freq: Every day | ORAL | Status: DC
  Administered 2020-03-16 – 2020-03-17 (×2): 1 mg via ORAL
  Filled 2020-03-16 (×2): qty 1

## 2020-03-16 MED ORDER — ONDANSETRON HCL 4 MG/2ML IJ SOLN: 4.0000 mg | Freq: Four times a day (QID) | INTRAMUSCULAR | Status: DC | PRN

## 2020-03-16 MED ORDER — LORAZEPAM 2 MG/ML IJ SOLN: 1.0000 mg | INTRAMUSCULAR | Status: DC | PRN

## 2020-03-16 MED ORDER — ADULT MULTIVITAMIN W/MINERALS CH
1.0000 | ORAL_TABLET | Freq: Every day | ORAL | Status: DC
  Administered 2020-03-16 – 2020-03-17 (×2): 1 via ORAL
  Filled 2020-03-16 (×2): qty 1

## 2020-03-16 MED ORDER — CLOPIDOGREL BISULFATE 75 MG PO TABS: 75.0000 mg | ORAL_TABLET | Freq: Every day | ORAL | 0 refills | Status: DC

## 2020-03-16 MED ORDER — LISINOPRIL 20 MG PO TABS: 20.0000 mg | ORAL_TABLET | Freq: Every day | ORAL | Status: DC

## 2020-03-16 MED ORDER — LACTATED RINGERS IV SOLN: INTRAVENOUS | Status: AC

## 2020-03-16 MED ORDER — HYDROCHLOROTHIAZIDE 25 MG PO TABS: 25.0000 mg | ORAL_TABLET | Freq: Every day | ORAL | Status: DC

## 2020-03-16 MED ORDER — INSULIN ASPART 100 UNIT/ML ~~LOC~~ SOLN
0.0000 [IU] | Freq: Three times a day (TID) | SUBCUTANEOUS | Status: DC
  Administered 2020-03-16 – 2020-03-17 (×2): 3 [IU] via SUBCUTANEOUS

## 2020-03-16 MED ORDER — GADOBUTROL 1 MMOL/ML IV SOLN
9.5000 mL | Freq: Once | INTRAVENOUS | Status: AC | PRN
  Administered 2020-03-16: 9.5 mL via INTRAVENOUS

## 2020-03-16 MED ORDER — ATORVASTATIN CALCIUM 80 MG PO TABS: 80.0000 mg | ORAL_TABLET | Freq: Every day | ORAL | 0 refills | Status: AC

## 2020-03-16 MED ORDER — LACTATED RINGERS IV BOLUS
500.0000 mL | Freq: Once | INTRAVENOUS | Status: AC
  Administered 2020-03-16: 500 mL via INTRAVENOUS

## 2020-03-16 MED ORDER — IOHEXOL 350 MG/ML SOLN
83.0000 mL | Freq: Once | INTRAVENOUS | Status: AC | PRN
  Administered 2020-03-16: 83 mL via INTRAVENOUS

## 2020-03-16 MED ORDER — ATORVASTATIN CALCIUM 80 MG PO TABS
80.0000 mg | ORAL_TABLET | Freq: Every day | ORAL | Status: DC
  Administered 2020-03-16 – 2020-03-17 (×2): 80 mg via ORAL
  Filled 2020-03-16 (×2): qty 1

## 2020-03-16 MED ORDER — METFORMIN HCL 500 MG PO TABS: 500.0000 mg | ORAL_TABLET | Freq: Two times a day (BID) | ORAL | 1 refills | Status: DC

## 2020-03-16 MED ORDER — POTASSIUM CHLORIDE CRYS ER 20 MEQ PO TBCR: 20.0000 meq | EXTENDED_RELEASE_TABLET | Freq: Every day | ORAL | Status: DC

## 2020-03-16 MED ORDER — THIAMINE HCL 100 MG/ML IJ SOLN
100.0000 mg | Freq: Every day | INTRAMUSCULAR | Status: DC
  Filled 2020-03-16: qty 2

## 2020-03-16 MED ORDER — THIAMINE HCL 100 MG PO TABS
100.0000 mg | ORAL_TABLET | Freq: Every day | ORAL | Status: DC
  Administered 2020-03-16 – 2020-03-17 (×2): 100 mg via ORAL
  Filled 2020-03-16 (×2): qty 1

## 2020-03-16 MED ORDER — ACETAMINOPHEN 650 MG RE SUPP
650.0000 mg | RECTAL | Status: DC | PRN
  Filled 2020-03-16: qty 1

## 2020-03-16 MED ORDER — STROKE: EARLY STAGES OF RECOVERY BOOK: Freq: Once | Status: AC

## 2020-03-16 MED ORDER — ACETAMINOPHEN 325 MG PO TABS: 650.0000 mg | ORAL_TABLET | ORAL | Status: DC | PRN

## 2020-03-16 MED ORDER — ASPIRIN EC 81 MG PO TBEC
81.0000 mg | DELAYED_RELEASE_TABLET | Freq: Every day | ORAL | Status: DC
  Administered 2020-03-17: 81 mg via ORAL
  Filled 2020-03-16: qty 1

## 2020-03-16 MED ORDER — STROKE: EARLY STAGES OF RECOVERY BOOK
Freq: Once | Status: AC
  Filled 2020-03-16: qty 1

## 2020-03-16 MED ORDER — POLYETHYLENE GLYCOL 3350 17 G PO PACK: 17.0000 g | PACK | Freq: Every day | ORAL | Status: DC | PRN

## 2020-03-16 MED ORDER — HYDRALAZINE HCL 20 MG/ML IJ SOLN: 10.0000 mg | Freq: Four times a day (QID) | INTRAMUSCULAR | Status: DC | PRN

## 2020-03-16 MED ORDER — LORAZEPAM 1 MG PO TABS
1.0000 mg | ORAL_TABLET | ORAL | Status: DC | PRN
Start: 2020-03-16 — End: 2020-03-17

## 2020-03-16 MED ORDER — ASPIRIN 300 MG RE SUPP
300.0000 mg | Freq: Once | RECTAL | Status: AC
  Administered 2020-03-16: 300 mg via RECTAL
  Filled 2020-03-16: qty 1

## 2020-03-16 MED ORDER — ASPIRIN 81 MG PO TBEC: 81.0000 mg | DELAYED_RELEASE_TABLET | Freq: Every day | ORAL | 2 refills | Status: DC

## 2020-03-16 MED ORDER — ACETAMINOPHEN 160 MG/5ML PO SOLN: 650.0000 mg | ORAL | Status: DC | PRN

## 2020-03-16 NOTE — Evaluation (Signed)
Occupational Therapy Evaluation Patient Details Name: [name] MRN: [id number] DOB: [birth year] Today's Date: 03/16/2020    History of Present Illness 70 year old male with past medical history of hypertension, diabetes mellitus type 2 (diet controlled), coronary artery disease (MI approx 25 yrs ago S/P PCI) who presents to [facility] emergency department with complaints of headache and difficulty with speech. 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   Clinical Impression   Pt admitted with the above diagnoses and presents with below problem list. Pt will benefit from continued acute OT to address the below listed deficits and maximize independence with basic ADLs prior to d/c to venue below. PTA pt was independent with ADLs, lives alone. Pt presents with impaired cognition, decreased awareness, and impulsivity impacting his assist levels and safety with ADLs. As OT was walking to pt's room his chair alarm was going off. Upon OT arrival into room pt was standing in front of recliner with leg rest still elevated. He was noted to be tangled a bit with his lines and had no regard for IV pole. 2x LOB posteriorly with pt needing cues to not sit on elevated leg rest. Cued to used urinal that was on the table in front of him. Pt does not currently appear safe to d/c home with no supervision. May need to consider SNF at d/c for continued therapy in a setting that can provide 24 hour supervision.     Follow Up Recommendations  Supervision/Assistance - 24 hour;SNF    Equipment Recommendations  None recommended by OT    Recommendations for Other Services       Precautions / Restrictions Precautions Precautions: Fall Precaution Comments: impulsive + impaired cognition Restrictions Weight Bearing Restrictions: No      Mobility Bed Mobility Overal bed mobility: Needs Assistance Bed Mobility: Sit to  Supine       Sit to supine: Supervision   General bed mobility comments: supervision for safety    Transfers Overall transfer level: Needs assistance Equipment used: None Transfers: Sit to/from Stand Sit to Stand: Min guard         General transfer comment: min guard for safety. Decreased awareness of lines + impulsive.    Balance Overall balance assessment: Needs assistance Sitting-balance support: No upper extremity supported;Feet supported Sitting balance-Leahy Scale: Fair     Standing balance support: No upper extremity supported Standing balance-Leahy Scale: Poor Standing balance comment: 2x LOB posteriorly in dynamic standing 2/2 impaired cognition + impulsive. Trying to walk to bathroom while tangled a bit with his lines, no regard for IV pole.                           ADL either performed or assessed with clinical judgement   ADL Overall ADL's : Needs assistance/impaired Eating/Feeding: Set up;Sitting   Grooming: Min guard;Standing   Upper Body Bathing: Minimal assistance;Sitting;Set up Upper Body Bathing Details (indicate cue type and reason): 2/2 cognition Lower Body Bathing: Minimal assistance;Sit to/from stand   Upper Body Dressing : Minimal assistance;Sitting;Set up   Lower Body Dressing: Minimal assistance;Sit to/from stand   Toilet Transfer: Min guard;Ambulation;Cueing for safety;Cueing for sequencing;Comfort height toilet;RW Toilet Transfer Details (indicate cue type and reason): simulated this session d/t urgency to void Toileting- Clothing Manipulation and Hygiene: Minimal assistance;Sit to/from stand;Sitting/lateral lean;Set up   Tub/ Shower Transfer: Min guard;Minimal assistance;Ambulation   Functional mobility during ADLs: Min  guard General ADL Comments: Pt's standing balance is fair. Impaired cognition seems to be his biggest deficit impacting ADLs and safety with mobility. Impulsive + impaired cognition. Upon arrival of OT pt  standing from recliner (leg rest still elevated) and tangled a bit in his lines. OT provided min A 2x d/t LOB posteriorly during this situation.     Vision   Additional Comments: Unable to assess this session d/t arrival of ECHO. would benefit from further assessment     Perception     Praxis      Pertinent Vitals/Pain Pain Assessment: No/denies pain     Hand Dominance     Extremity/Trunk Assessment Upper Extremity Assessment Upper Extremity Assessment: Generalized weakness;Overall [facility] for tasks assessed   Lower Extremity Assessment Lower Extremity Assessment: Defer to PT evaluation       Communication Communication Communication: No difficulties   Cognition Arousal/Alertness: Awake/alert Behavior During Therapy: Flat affect;WFL for tasks assessed/performed Overall Cognitive Status: Impaired/Different from baseline Area of Impairment: Orientation;Attention;Memory;Safety/judgement;Awareness;Problem solving;Following commands                 Orientation Level: Time;Situation Current Attention Level: Focused Memory: Decreased recall of precautions;Decreased short-term memory Following Commands: Follows one step commands consistently;Follows multi-step commands inconsistently Safety/Judgement: Decreased awareness of safety;Decreased awareness of deficits Awareness: Intellectual Problem Solving: Slow processing;Difficulty sequencing;Requires verbal cues;Requires tactile cues General Comments: OT arrived in the room as his chair alarm was going off. Pt had stood up from recliner with leg rest still up and was saying he needed to go to the bathroom. Decreased awareness of lines/environment. Cued to utilize portable urine collector that was on the table in front of him.   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        OT Problem List: Decreased activity tolerance;Impaired balance (sitting and/or standing);Decreased cognition;Decreased safety awareness;Decreased knowledge of use of DME or AE;Decreased knowledge of precautions      OT Treatment/Interventions: Self-care/ADL training;Therapeutic exercise;DME and/or AE instruction;Therapeutic activities;Cognitive remediation/compensation;Patient/family education;Balance training    OT Goals(Current goals can be found in the care plan section) Acute Rehab OT Goals Patient Stated Goal: home OT Goal Formulation: With patient Time For Goal Achievement: 03/30/20 Potential to Achieve Goals: Fair ADL Goals Pt Will Perform Grooming: with supervision;standing;sitting Pt Will Perform Upper Body Dressing: with supervision;with set-up;sitting Pt Will Perform Lower Body Dressing: with supervision;sit to/from stand Pt Will Transfer to Toilet: with supervision;ambulating Pt Will Perform Toileting - Clothing Manipulation and hygiene: with supervision;sitting/lateral leans;sit to/from stand  OT Frequency: Min 2X/week   Barriers to D/C: Decreased caregiver support  lives alone, does not have 24/7 assist/supervision       Co-evaluation              AM-PAC OT "6 Clicks" Daily Activity     Outcome Measure Help from another person eating meals?: None Help from another person taking care of personal grooming?: A Little  Help from another person toileting, which includes using toliet, bedpan, or urinal?: A Little Help from another person bathing (including washing, rinsing, drying)?: A  Lot Help from another person to put on and taking off regular upper body clothing?: A Little Help from another person to put on and taking off regular lower body clothing?: A Little 6 Click Score: 18   End of Session Nurse Communication: Mobility status;Other (comment) (impaired cognition + impulsive)  Activity Tolerance: Patient tolerated treatment well Patient left: in bed;with call bell/phone within reach;Other (comment);with bed alarm set (with ECHO)  OT Visit Diagnosis: Unsteadiness on feet (R26.81);Muscle weakness (generalized) (M62.81);Other symptoms and signs involving cognitive function                Time: 1055-1105 OT Time Calculation (min): 10 min Charges:  OT General Charges $OT Visit: 1 Visit OT Evaluation $OT Eval Moderate Complexity: 1 Mod  Raynald Kemp, OT Acute Rehabilitation Services Pager: [phone] Office: [phone]   Pilar Grammes 03/16/2020, 3:34 PM

## 2020-03-16 NOTE — ED Notes (Signed)
Pt to MRI

## 2020-03-16 NOTE — Progress Notes (Signed)
  Echocardiogram 2D Echocardiogram has been performed.  Delcie Roch 03/16/2020, 12:01 PM

## 2020-03-16 NOTE — ED Notes (Addendum)
Pt's daughter's name is Moises Blood, [phone].

## 2020-03-16 NOTE — Progress Notes (Signed)
Admission from earlier this morning. Briefly, 70 year old male with history of DM-2, CAD, HTN, alcohol use and polycythemia presenting with severe headache, speech difficulty and confusion and found to have acute left PCA territory CVA in the setting of left PCA occlusion.  His encephalopathy resolved this morning.  However, he seems to have right inferior quadrantanopsia.  Encephalopathy resolved.  Neurology recommending permissive hypertension, and doubtful 3 weeks followed by low-dose aspirin alone.  Also started on started on high intensity statin.  See H&P from earlier this morning for more.

## 2020-03-16 NOTE — ED Notes (Signed)
Pt returned from MRI. Neurology MD at bedside.

## 2020-03-16 NOTE — Progress Notes (Addendum)
STROKE TEAM PROGRESS NOTE   INTERVAL HISTORY Resting in bed alert comfortable with daughter at bedside. No new complaints or overnight events.  Vitals:   03/16/20 0230 03/16/20 0231 03/16/20 0245 03/16/20 0415  BP: 132/62  (!) 117/58 (!) 148/90  Pulse: 73  71 75  Resp: 13  13 20   Temp:    98.1 F (36.7 C)  TempSrc:    Oral  SpO2: (!) 76% 96% 97% 97%  Weight:      Height:       CBC:  Recent Labs  Lab 03/15/20 1902 03/15/20 1913  WBC 8.8  --   NEUTROABS 7.3  --   HGB 17.9* 18.0*  HCT 52.4* 53.0*  MCV 92.7  --   PLT 297  --    Basic Metabolic Panel:  Recent Labs  Lab 03/15/20 1902 03/15/20 1913  NA 135 137  K 3.8 4.0  CL 94* 96*  CO2 29  --   GLUCOSE 137* 141*  BUN 14 14  CREATININE 0.92 0.90  CALCIUM 10.9*  --    *Lipid Panel:  Recent Labs  Lab 03/16/20 0421  CHOL 199  TRIG 71  HDL 56  CHOLHDL 3.6  VLDL 14  LDLCALC 161129*   *HgbA1c:  Recent Labs  Lab 03/16/20 0421  HGBA1C 6.5*   Urine Drug Screen:  Recent Labs  Lab 03/15/20 1913  LABOPIA NONE DETECTED  COCAINSCRNUR NONE DETECTED  LABBENZ NONE DETECTED  AMPHETMU NONE DETECTED  THCU NONE DETECTED  LABBARB NONE DETECTED    Alcohol Level No results for input(s): ETH in the last 168 hours.  IMAGING past 24 hours CT Angio Head W or Wo Contrast  Result Date: 03/16/2020 CLINICAL DATA:  Mental status changes. Frontal headache. Left PCA acute infarction. EXAM: CT ANGIOGRAPHY HEAD AND NECK TECHNIQUE: Multidetector CT imaging of the head and neck was performed using the standard protocol during bolus administration of intravenous contrast. Multiplanar CT image reconstructions and MIPs were obtained to evaluate the vascular anatomy. Carotid stenosis measurements (when applicable) are obtained utilizing NASCET criteria, using the distal internal carotid diameter as the denominator. CONTRAST:  83mL OMNIPAQUE IOHEXOL 350 MG/ML SOLN COMPARISON:  MRI studies earlier same day.  CT late yesterday. FINDINGS: CTA  NECK FINDINGS Aortic arch: Aortic atherosclerosis. Branching pattern is normal without origin stenosis. Right carotid system: Common carotid artery widely patent to the bifurcation. Calcified plaque at the carotid bifurcation and ICA bulb. Minimal diameter of the ICA is the same as the more distal cervical ICA, therefore no stenosis. Left carotid system: Common carotid artery widely patent to the bifurcation. Calcified plaque at carotid bifurcation and ICA bulb. Minimal diameter in the distal bulb 1.8 mm. Compared to a more distal cervical ICA diameter of 4.5 mm, this indicates a 60% stenosis. Vertebral arteries: Mild atherosclerotic change at both vertebral artery origins with stenosis estimated at 30%. Beyond that, both vertebral arteries are patent through the cervical region to the foramen magnum. Skeleton: Ordinary cervical spondylosis and facet arthritis. Other neck: No mass or lymphadenopathy. Upper chest: Negative Review of the MIP images confirms the above findings CTA HEAD FINDINGS Anterior circulation: Both internal carotid arteries are patent through the skull base and siphon regions. Ordinary atherosclerotic calcification in the carotid siphons but no stenosis greater than 30%. The anterior and middle cerebral vessels are patent. 30% stenosis of the distal right M1 segment. 50% stenosis of the left M1 segment. Tiny posterior communicating artery on the left. Posterior circulation: Dominant right vertebral artery  is widely patent through the foramen magnum. There is atherosclerotic disease in the V4 segment with 50% stenosis. Beyond that, the vessel is patent to the basilar. Non dominant left vertebral artery is patent through the foramen magnum and supplies PICA. Severe stenosis distal to that but with some antegrade flow. No basilar stenosis. Superior cerebellar arteries show flow. Right posterior cerebral artery shows flow, with a moderate stenosis at the P2 P3 junction. Left posterior cerebral artery  is occluded immediately beyond its origin. Venous sinuses: Patent and normal. Anatomic variants: None significant. IMPRESSION: 1. Aortic atherosclerosis. 2. Atherosclerotic disease at both carotid bifurcations. No measurable stenosis on the right. 60% stenosis of the distal bulb on the left. 3. 30% stenosis of both vertebral artery origins. 50% stenosis of the right V4 segment. Severe stenosis of the left vertebral artery just beyond PICA, but with some antegrade flow. 4. 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 Stenoses in the right posterior cerebral artery, moderate at the P2 P3 junction. 5. 30% stenosis of the distal right M1 segment. 50% stenosis of the left M1 segment. Aortic Atherosclerosis (ICD10-I70.0). Electronically Signed   By: Paulina Fusi M.D.   On: 03/16/2020 03:06   CT HEAD WO CONTRAST  Result Date: 03/15/2020 CLINICAL DATA:  Headache.  Mental status change, cause unknown. EXAM: CT HEAD WITHOUT CONTRAST TECHNIQUE: Contiguous axial images were obtained from the base of the skull through the vertex without intravenous contrast. COMPARISON:  None. FINDINGS: Brain: Left the occipital encephalomalacia. No evidence of large-territorial acute infarction. No parenchymal hemorrhage. No mass lesion. No extra-axial collection. No mass effect or midline shift. No hydrocephalus. Basilar cisterns are patent. Vascular: No hyperdense vessel. Atherosclerotic calcifications are present within the cavernous internal carotid arteries. Skull: No acute fracture or focal lesion. Sinuses/Orbits: Paranasal sinuses and mastoid air cells are clear. The orbits are unremarkable. Other: None. IMPRESSION: No acute intracranial abnormality. Electronically Signed   By: Tish Frederickson M.D.   On: 03/15/2020 19:49   CT Angio Neck W and/or Wo Contrast  Result Date: 03/16/2020 CLINICAL DATA:  Mental status changes. Frontal headache. Left PCA acute infarction. EXAM: CT ANGIOGRAPHY HEAD AND NECK  TECHNIQUE: Multidetector CT imaging of the head and neck was performed using the standard protocol during bolus administration of intravenous contrast. Multiplanar CT image reconstructions and MIPs were obtained to evaluate the vascular anatomy. Carotid stenosis measurements (when applicable) are obtained utilizing NASCET criteria, using the distal internal carotid diameter as the denominator. CONTRAST:  1mL OMNIPAQUE IOHEXOL 350 MG/ML SOLN COMPARISON:  MRI studies earlier same day.  CT late yesterday. FINDINGS: CTA NECK FINDINGS Aortic arch: Aortic atherosclerosis. Branching pattern is normal without origin stenosis. Right carotid system: Common carotid artery widely patent to the bifurcation. Calcified plaque at the carotid bifurcation and ICA bulb. Minimal diameter of the ICA is the same as the more distal cervical ICA, therefore no stenosis. Left carotid system: Common carotid artery widely patent to the bifurcation. Calcified plaque at carotid bifurcation and ICA bulb. Minimal diameter in the distal bulb 1.8 mm. Compared to a more distal cervical ICA diameter of 4.5 mm, this indicates a 60% stenosis. Vertebral arteries: Mild atherosclerotic change at both vertebral artery origins with stenosis estimated at 30%. Beyond that, both vertebral arteries are patent through the cervical region to the foramen magnum. Skeleton: Ordinary cervical spondylosis and facet arthritis. Other neck: No mass or lymphadenopathy. Upper chest: Negative Review of the MIP images confirms the above findings CTA HEAD FINDINGS Anterior circulation: Both  internal carotid arteries are patent through the skull base and siphon regions. Ordinary atherosclerotic calcification in the carotid siphons but no stenosis greater than 30%. The anterior and middle cerebral vessels are patent. 30% stenosis of the distal right M1 segment. 50% stenosis of the left M1 segment. Tiny posterior communicating artery on the left. Posterior circulation: Dominant  right vertebral artery is widely patent through the foramen magnum. There is atherosclerotic disease in the V4 segment with 50% stenosis. Beyond that, the vessel is patent to the basilar. Non dominant left vertebral artery is patent through the foramen magnum and supplies PICA. Severe stenosis distal to that but with some antegrade flow. No basilar stenosis. Superior cerebellar arteries show flow. Right posterior cerebral artery shows flow, with a moderate stenosis at the P2 P3 junction. Left posterior cerebral artery is occluded immediately beyond its origin. Venous sinuses: Patent and normal. Anatomic variants: None significant. IMPRESSION: 1. Aortic atherosclerosis. 2. Atherosclerotic disease at both carotid bifurcations. No measurable stenosis on the right. 60% stenosis of the distal bulb on the left. 3. 30% stenosis of both vertebral artery origins. 50% stenosis of the right V4 segment. Severe stenosis of the left vertebral artery just beyond PICA, but with some antegrade flow. 4. 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 Stenoses in the right posterior cerebral artery, moderate at the P2 P3 junction. 5. 30% stenosis of the distal right M1 segment. 50% stenosis of the left M1 segment. Aortic Atherosclerosis (ICD10-I70.0). Electronically Signed   By: Paulina Fusi M.D.   On: 03/16/2020 03:06   MR BRAIN WO CONTRAST  Result Date: 03/16/2020 CLINICAL DATA:  Mental status changes.  Frontal headache. EXAM: MRI HEAD WITHOUT CONTRAST TECHNIQUE: Multiplanar, multiecho pulse sequences of the brain and surrounding structures were obtained. COMPARISON:  None. FINDINGS: MR HEAD FINDINGS Brain: Acute infarction in the left PCA territory affecting the posteromedial temporal lobe and occipital lobe. Minimal petechial blood products. No frank hematoma. No other acute infarction identified. There is an old infarction at the left temporoparietal junction with atrophy and encephalomalacia. No sign  of widespread small-vessel disease. No hydrocephalus, mass or extra-axial collection. Vascular: Major vessels at the base of the brain show flow. However, there may be abnormal flow in the left vertebral artery. Skull and upper cervical spine: Negative Sinuses/Orbits: No significant sinus disease.  Orbits negative. Other: None IMPRESSION: 1. Acute infarction in the left PCA territory affecting the left posteromedial temporal lobe and occipital lobe. Minimal petechial blood products. No frank hematoma. 2. 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 3. Question abnormal flow in the left vertebral artery. Electronically Signed   By: Paulina Fusi M.D.   On: 03/16/2020 01:24   MR MRV HEAD W WO CONTRAST  Result Date: 03/16/2020 CLINICAL DATA:  Mental status changes of unknown cause. EXAM: MR VENOGRAM HEAD WITHOUT AND WITH CONTRAST TECHNIQUE: Angiographic images of the intracranial venous structures were obtained using MRV technique without and with intravenous contrast. CONTRAST:  9.75mL GADAVIST GADOBUTROL 1 MMOL/ML IV SOLN COMPARISON:  MRI brain earlier same day FINDINGS: Intracranial venous structures are normal. Superior sagittal sinus is normal. Transverse sinuses are normal. Sigmoid sinuses are normal. Both jugular veins are patent. Deep veins are patent. Some enhancement noted of the sulci in the left lateral occipital lobe consistent with subacute infarction in that region. No enhancement seen in the region of acute left PCA infarction. IMPRESSION: 1. Normal intracranial MR venography. 2. Evidence of subacute infarction in the left lateral occipital lobe, with cortical  enhancement. No enhancement seen in the region of acute infarction. See results of previous MRI. Electronically Signed   By: Paulina Fusi M.D.   On: 03/16/2020 02:01    PHYSICAL EXAM Constitutional: Appears well-developed and well-nourished, obese Psych: Affect appropriate to situation, mildly irritable when I wake him Eyes: No  scleral injection HENT: No oropharyngeal obstruction.  MSK: no joint deformities.  Cardiovascular: Normal rate and regular rhythm.  Respiratory: Effort normal, non-labored breathing; does snore when sleeping with brief apnea at times GI: Soft.  No distension. There is no tenderness.  Skin: Warm dry and intact visible skin  Neuro: Mental Status: Patient is alert and oriented x3.  Perseverates on naming objects and with phonemic paraphasia and occasional phonological blending. Cranial Nerves: II: Visual Fields right lower quadrantanopia. Pupils are equal, round, and briskly reactive to light 4-->42mm III,IV, VI: EOMI without ptosis or diploplia.  V: Facial sensation is symmetric to light touch VII: Facial movement is symmetric.  VIII: hearing is intact to voice X: Uvula elevates symmetrically XII: tongue is midline without atrophy or fasciculations.  Motor: Tone is normal. Bulk is normal.  He has no drift in any of his extremities, but is too sleepy to participate in confrontational testing Sensory: Sensation is symmetric to light touch in the arms and legs, without extinction to double simultaneous stimuli Deep Tendon Reflexes: 2+ and symmetric in the biceps and patellae.  Plantars: Briskly withdraws feet bilaterally Cerebellar: Too sleepy to participate  ASSESSMENT/PLAN  70 year old man with a past medical history significant for CAD s/p PTCA obtuse marginal 1997, hypertension, hyperlipidemia, diabetes, former smoking, coronary artery disease, L shoulder surgery for dislocation, presenting with intermittent headache and word finding difficulties secondary to a left PCA stroke. The patient has significant vascular risk factors and intracranial atherosclerosis noted angiography. Patient may schedule follow up appointment with Dr Julieanne Cotton, MD interventional neuroradiology for evaluation and monitoring of vertebral artery stenotic disease - [phone]).  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  2D Echo 55-60% No wall motion abnormalities, shunt or LVT   LDL 129  HgbA1c 6.5  VTE prophylaxis - subq heparin    Diet   Diet NPO time specified   Will not load clopidogrel  Due to size of stroke and petechial hemorrhages seen on MRI.   ASA  (per cardiology) and clopidogrel  daily for 3 months as recommended on initial consult 03/16/2020, then continue either aspirin or clopidogrel in coordination with cardiology.  Therapy recommendations:  TBD  Disposition: Home  Patient may schedule follow up appointment with Dr Julieanne Cotton, MD interventional neuroradiology for evaluation and monitoring of vertebral artery stenotic disease - [phone]).  Hypertension . Long-term BP goal normotensive <140/90  Hyperlipidemia  Home meds:  None  LDL 129, goal < 70  High intensity statin: add Lipitor    Continue statin at discharge  Diabetes type II Controlled  HgbA1c 6.5, goal < 7.0  CBGs Recent Labs    03/15/20 1914 03/16/20 0733  GLUCAP 120* 112*      SSI  Other Stroke Risk Factors  Advanced Age >/= 48   Cigarette smoker 30 pack  year history  Current ETOH use, alcohol level No results found for requested labs within last 28315 hours advised to drink no more than 1-2 drink(s) a day  Family hx stroke (grandmother)  Coronary artery disease  Hospital day # 0  Stroke work up is complete and the patient may follow up with neurology, PCP and interventional neuroradiology as noted above.  Marisue Humble, MD Page: [phone]   To contact Stroke Continuity provider, please refer to [email or web address]. After hours, contact General Neurology

## 2020-03-16 NOTE — Consult Note (Signed)
Neurology Consultation Reason for Consult: Headache and confusion Requesting Physician: Shauna Hugh  CC: Headache  History is obtained from: Patient, daughter at bedside and chart review   HPI: [name] is a 70 y.o. male with a past medical history significant for hypertension, diabetes (A1c improved from 6.5% to 6.2% on 12/20/2019 with diet changes), former smoking (quit in 2004), coronary artery disease complicated by MI at age 34.  Patient was seen after having received 2 mg Ativan for MRI scan and therefore he was able to provide very limited history.  Daughter at bedside reported that he had called her in the morning and saying he felt disoriented, nervous, and scared.  She lives an hour away so had another family friend nearby bring the patient to the emergency department for further evaluation.  To Dr. Jeraldine Loots at [facility] he reported that he was having some difficulty with his memory and having a headache that was intermittent since at least 2/25.  He initially failed MRI at [facility] but then was convinced to have the study with anxiolytic.   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   LKW: Likely 2/24 tPA given?: No, due to out of the window Premorbid modified rankin scale:      0 - No symptoms.  ROS: Unable to obtain due to altered mental status.   Past Medical History:  Diagnosis Date  . Hypertension   and other problems as in HPI   History reviewed. No pertinent surgical history. on file, but daughter reports cardiac cath at age 17   No family history on file.  Daughter reports strong family history of early MI, including patient's father passing from MI at age 48  Social History:  reports that he has quit smoking. He does not have any smokeless tobacco history on file. He reports current alcohol use. He reports that he does not use drugs.  Exam: Current vital signs: BP 118/66    Pulse 82   Temp 98.4 F (36.9 C) (Oral)   Resp 13   Ht 5\' 10"  (1.778 m)   Wt 95.3 kg   SpO2 94%   BMI 30.13 kg/m  Vital signs in last 24 hours: Temp:  [98.4 F (36.9 C)] 98.4 F (36.9 C) (02/25 2356) Pulse Rate:  [60-116] 82 (02/26 0030) Resp:  [12-20] 13 (02/26 0030) BP: (118-181)/(66-101) 118/66 (02/26 0030) SpO2:  [93 %-100 %] 94 % (02/26 0030) Weight:  [95.3 kg] 95.3 kg (02/25 1903)   Physical Exam  Constitutional: Appears well-developed and well-nourished, obese Psych: Affect appropriate to situation, mildly irritable when I wake him Eyes: No scleral injection HENT: No oropharyngeal obstruction.  MSK: no joint deformities.  Cardiovascular: Normal rate and regular rhythm.  Respiratory: Effort normal, non-labored breathing; does snore when sleeping with brief apnea at times GI: Soft.  No distension. There is no tenderness.  Skin: Warm dry and intact visible skin  Neuro: Mental Status: Patient is very sleepy but oriented to person, place, month, and situation.  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 Pupils are equal, round, and reactive to light 4 to 2 mm  III,IV, VI: EOMI without ptosis or diploplia.  V: Facial sensation is symmetric to light touch VII: Facial movement is symmetric.  VIII: hearing is intact to voice X: Uvula elevates symmetrically XII: tongue is midline without atrophy or fasciculations.  Motor: Tone is normal. Bulk is normal.  He has no drift in any of his extremities, but is too sleepy to participate in confrontational testing Sensory: Sensation is symmetric to light touch in the arms and legs, without extinction to double simultaneous  stimuli Deep Tendon Reflexes: 2+ and symmetric in the biceps and patellae.  Plantars: Briskly withdraws feet bilaterally Cerebellar: Too sleepy to participate  NIHSS total 4 Score breakdown:  1 point for drowsiness, 1 point for visual field deficit, 1 point for mild aphasia, 1 point for mild dysarthria  I have reviewed labs in epic and the results pertinent to this consultation are: Creatinine 0.9 Hemoglobin 17.9 (normal white blood cell count at 8.8 and normal platelets at 297); 16.1 on 12/20/2019 UA negative for infection and U tox negative   I have reviewed the images obtained: Head CT with chronic left parietal/occipital hypodensity MRI brain with acute and subacute infarcts in the left PCA territory (temporal and inferior occipital lobes) CTA with left PCA proximal occlusion, aortic arch atherosclerosis, left carotid stenosis about 60%, scattered intracranial atherosclerosis including severe stenosis of the left vertebral artery without occlusion  Impression: This is a 70 year old man with a past medical history significant for hypertension, hyperlipidemia, diabetes, former smoking, coronary artery disease, presenting with headache and word finding difficulties secondary to a left PCA stroke.  Exam is limited secondary to Ativan on board, however I do suspect that he has a right upper quadrantanopia in addition to some word finding difficulties.  Recommendations: # Left PCA territory stroke, suspect atheroembolic but cardioembolic remains a possibility - Stroke labs TSH, HgbA1c, fasting lipid panel - MRI brain completed as above - CTA completed as above - Frequent neuro checks - Echocardiogram - Prophylactic therapy-Antiplatelet med: Aspirin - dose 325mg  PO or 300mg  PR, followed by 81 mg daily - If no indication for anticoagulation on echocardiogram, add Plavix 300 mg load with 75 mg daily for 90 day course; will need to confirm with patient that he has no significant recent  bleeding when he is more awake - Risk factor modification - Telemetry monitoring; consider 30 day event monitor on discharge if no arrythmias captured  - Blood pressure goal   - Normotension as patient has been tolerating normotension without change in neurological status and is > 24 hours since symptom onset; if he declines please activate a new code stroke, lay flat and fluid bolus patient - PT consult, OT consult, Speech consult - Stroke team to follow  #Polycythemia - Maybe secondary to volume depletion given normal value on 12/20/19 at [facility] patient and trend hemoglobin, further workup if presistent  14/1/21 MD-PhD [facility] [phone]

## 2020-03-16 NOTE — H&P (Signed)
History and Physical    [name] IHK:[id number] DOB: Jun 09, 1950 DOA: 03/15/2020  PCP: Pcp, No  Patient coming from: Home - ED to ED transfer from [facility] Complaint: Headache, confusion   HPI:    70 year old male with past medical history of hypertension, diabetes mellitus type 2 (diet controlled), coronary artery disease (MI approx 25 yrs ago S/P PCI) who presents to [facility] emergency department with complaints of headache and difficulty with speech.  Patient is extremely lethargic at this point due to administration of Ativan earlier in the evening and is unable to provide history. Majority the history has been obtained from the daughter who is at the bedside.  Daughter explains that the evening of 2/25 at approximately 6:30 PM she was contacted by her father due to sudden onset severe headache. While she was speaking to her father on the phone she noticed that he was having difficulty with speaking, particularly in word finding. She denies any slurring of speech however. According to the daughter the patient had no concurrent unilateral weakness, facial droop difficulty with balance or changes in vision.  Patient was promptly brought to the [facility] emergency department by family friend. Upon arrival, there was immediate concern for stroke however TPA was not administered due to intermittent symptoms. Attempts were made to send patient MRI but unfortunately patient could not tolerate the test. MRI department unfortunately is no longer available for the evening at [facility] and the patient was transferred from the [facility] emergency department to the [facility] emergency department for further prompt evaluation. Patient was administered Ativan and underwent MRI at [facility] revealing an acute infarct in the left posterior cerebral artery territory with minimal parenchymal blood products. Patient was promptly evaluated by Dr. Iver Nestle with neurology. The  hospitalist group was then called to assess the patient for admission the hospital.  Review of Systems:   Review of Systems  Unable to perform ROS: Mental status change    Past Medical History:  Diagnosis Date  . Hypertension     History reviewed. No pertinent surgical history.   reports that he has quit smoking. He has never used smokeless tobacco. He reports current alcohol use. He reports that he does not use drugs.  Allergies  Allergen Reactions  . Ciprofloxacin Swelling    Ear canal swelling ciprofloxacin-dexAMETHasone (CIPRODEX)     Family History  Problem Relation Age of Onset  . Heart attack Father   . Stroke Paternal Great-grandfather      Prior to Admission medications   Medication Sig Start Date End Date Taking? Authorizing Provider  aspirin 325 MG EC tablet Take 325 mg by mouth daily.   Yes [provider]  hydrochlorothiazide (HYDRODIURIL) 25 MG tablet Take 25 mg by mouth daily. 02/26/20  Yes [provider]  ibuprofen (ADVIL) 800 MG tablet Take 800 mg by mouth every 8 (eight) hours as needed.   Yes [provider]  lisinopril (ZESTRIL) 20 MG tablet Take 20 mg by mouth daily. 03/12/20  Yes [provider]  potassium chloride SA (KLOR-CON) 20 MEQ tablet Take 20 mEq by mouth daily. 03/12/20  Yes [provider]    Physical Exam: Vitals:   03/16/20 0215 03/16/20 0230 03/16/20 0231 03/16/20 0245  BP: 107/75 132/62  (!) 117/58  Pulse: 75 73  71  Resp: 13 13  13   Temp:      TempSrc:      SpO2: 97% (!) 76% 96%  97%  Weight:      Height:        Constitutional: Patient is lethargic but arousable, not in acute distress  Skin: no rashes, no lesions, good skin turgor noted. Eyes: Pupils are equally reactive to light.  No evidence of scleral icterus or conjunctival pallor.  ENMT: Somewhat dry mucous membranes noted.  Posterior pharynx clear of any exudate or lesions.   Neck: normal, supple, no masses, no thyromegaly.  No  evidence of jugular venous distension.   Respiratory: clear to auscultation bilaterally, no wheezing, no crackles. Normal respiratory effort. No accessory muscle use.  Cardiovascular: Regular rate and rhythm, no murmurs / rubs / gallops. No extremity edema. 2+ pedal pulses. No carotid bruits.  Chest:   Nontender without crepitus or deformity.   Back:   Nontender without crepitus or deformity. Abdomen: Abdomen is soft and nontender.  No evidence of intra-abdominal masses.  Positive bowel sounds noted in all quadrants.   Musculoskeletal: No joint deformity upper and lower extremities. Good ROM, no contractures. Normal muscle tone.  Neurologic: Patient is unable to fully participate with neurologic exam due to severe lethargy status is benzodiazepine administration. Patient is moving all 4 extremities spontaneously. Patient does respond to verbal and painful stimuli. Patient does localize to pain. Sensation is seemingly grossly intact.  Psychiatric: Unable to assess due to severe lethargy. Currently does not seem to possess insight as to his current situation.  Labs on Admission: I have personally reviewed following labs and imaging studies -   CBC: Recent Labs  Lab 03/15/20 1902 03/15/20 1913  WBC 8.8  --   NEUTROABS 7.3  --   HGB 17.9* 18.0*  HCT 52.4* 53.0*  MCV 92.7  --   PLT 297  --    Basic Metabolic Panel: Recent Labs  Lab 03/15/20 1902 03/15/20 1913  NA 135 137  K 3.8 4.0  CL 94* 96*  CO2 29  --   GLUCOSE 137* 141*  BUN 14 14  CREATININE 0.92 0.90  CALCIUM 10.9*  --    GFR: Estimated Creatinine Clearance: 89.7 mL/min (by C-G formula based on SCr of 0.9 mg/dL). Liver Function Tests: Recent Labs  Lab 03/15/20 1902  AST 24  ALT 25  ALKPHOS 68  BILITOT 1.4*  PROT 8.8*  ALBUMIN 5.0   No results for input(s): LIPASE, AMYLASE in the last 168 hours. No results for input(s): AMMONIA in the last 168 hours. Coagulation Profile: Recent Labs  Lab 03/15/20 1902  INR  0.9   Cardiac Enzymes: No results for input(s): CKTOTAL, CKMB, CKMBINDEX, TROPONINI in the last 168 hours. BNP (last 3 results) No results for input(s): PROBNP in the last 8760 hours. HbA1C: No results for input(s): HGBA1C in the last 72 hours. CBG: Recent Labs  Lab 03/15/20 1914  GLUCAP 120*   Lipid Profile: No results for input(s): CHOL, HDL, LDLCALC, TRIG, CHOLHDL, LDLDIRECT in the last 72 hours. Thyroid Function Tests: No results for input(s): TSH, T4TOTAL, FREET4, T3FREE, THYROIDAB in the last 72 hours. Anemia Panel: No results for input(s): VITAMINB12, FOLATE, FERRITIN, TIBC, IRON, RETICCTPCT in the last 72 hours. Urine analysis:    Component Value Date/Time   COLORURINE YELLOW 03/15/2020 1913   APPEARANCEUR CLEAR 03/15/2020 1913   LABSPEC 1.006 03/15/2020 1913   PHURINE 7.0 03/15/2020 1913   GLUCOSEU NEGATIVE 03/15/2020 1913   HGBUR SMALL (A) 03/15/2020 1913   BILIRUBINUR NEGATIVE 03/15/2020 1913   KETONESUR NEGATIVE 03/15/2020 1913   PROTEINUR NEGATIVE 03/15/2020 1913  UROBILINOGEN 0.2 08/10/2008 2001   NITRITE NEGATIVE 03/15/2020 1913   LEUKOCYTESUR NEGATIVE 03/15/2020 1913    Radiological Exams on Admission - Personally Reviewed: CT Angio Head W or Wo Contrast  Result Date: 03/16/2020 CLINICAL DATA:  Mental status changes. Frontal headache. Left PCA acute infarction. EXAM: CT ANGIOGRAPHY HEAD AND NECK TECHNIQUE: Multidetector CT imaging of the head and neck was performed using the standard protocol during bolus administration of intravenous contrast. Multiplanar CT image reconstructions and MIPs were obtained to evaluate the vascular anatomy. Carotid stenosis measurements (when applicable) are obtained utilizing NASCET criteria, using the distal internal carotid diameter as the denominator. CONTRAST:  38mL OMNIPAQUE IOHEXOL 350 MG/ML SOLN COMPARISON:  MRI studies earlier same day.  CT late yesterday. FINDINGS: CTA NECK FINDINGS Aortic arch: Aortic atherosclerosis.  Branching pattern is normal without origin stenosis. Right carotid system: Common carotid artery widely patent to the bifurcation. Calcified plaque at the carotid bifurcation and ICA bulb. Minimal diameter of the ICA is the same as the more distal cervical ICA, therefore no stenosis. Left carotid system: Common carotid artery widely patent to the bifurcation. Calcified plaque at carotid bifurcation and ICA bulb. Minimal diameter in the distal bulb 1.8 mm. Compared to a more distal cervical ICA diameter of 4.5 mm, this indicates a 60% stenosis. Vertebral arteries: Mild atherosclerotic change at both vertebral artery origins with stenosis estimated at 30%. Beyond that, both vertebral arteries are patent through the cervical region to the foramen magnum. Skeleton: Ordinary cervical spondylosis and facet arthritis. Other neck: No mass or lymphadenopathy. Upper chest: Negative Review of the MIP images confirms the above findings CTA HEAD FINDINGS Anterior circulation: Both internal carotid arteries are patent through the skull base and siphon regions. Ordinary atherosclerotic calcification in the carotid siphons but no stenosis greater than 30%. The anterior and middle cerebral vessels are patent. 30% stenosis of the distal right M1 segment. 50% stenosis of the left M1 segment. Tiny posterior communicating artery on the left. Posterior circulation: Dominant right vertebral artery is widely patent through the foramen magnum. There is atherosclerotic disease in the V4 segment with 50% stenosis. Beyond that, the vessel is patent to the basilar. Non dominant left vertebral artery is patent through the foramen magnum and supplies PICA. Severe stenosis distal to that but with some antegrade flow. No basilar stenosis. Superior cerebellar arteries show flow. Right posterior cerebral artery shows flow, with a moderate stenosis at the P2 P3 junction. Left posterior cerebral artery is occluded immediately beyond its origin. Venous  sinuses: Patent and normal. Anatomic variants: None significant. IMPRESSION: 1. Aortic atherosclerosis. 2. Atherosclerotic disease at both carotid bifurcations. No measurable stenosis on the right. 60% stenosis of the distal bulb on the left. 3. 30% stenosis of both vertebral artery origins. 50% stenosis of the right V4 segment. Severe stenosis of the left vertebral artery just beyond PICA, but with some antegrade flow. 4. 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 Stenoses in the right posterior cerebral artery, moderate at the P2 P3 junction. 5. 30% stenosis of the distal right M1 segment. 50% stenosis of the left M1 segment. Aortic Atherosclerosis (ICD10-I70.0). Electronically Signed   By: Paulina Fusi M.D.   On: 03/16/2020 03:06   CT HEAD WO CONTRAST  Result Date: 03/15/2020 CLINICAL DATA:  Headache.  Mental status change, cause unknown. EXAM: CT HEAD WITHOUT CONTRAST TECHNIQUE: Contiguous axial images were obtained from the base of the skull through the vertex without intravenous contrast. COMPARISON:  None. FINDINGS: Brain: Left  the occipital encephalomalacia. No evidence of large-territorial acute infarction. No parenchymal hemorrhage. No mass lesion. No extra-axial collection. No mass effect or midline shift. No hydrocephalus. Basilar cisterns are patent. Vascular: No hyperdense vessel. Atherosclerotic calcifications are present within the cavernous internal carotid arteries. Skull: No acute fracture or focal lesion. Sinuses/Orbits: Paranasal sinuses and mastoid air cells are clear. The orbits are unremarkable. Other: None. IMPRESSION: No acute intracranial abnormality. Electronically Signed   By: Tish FredericksonMorgane  Naveau M.D.   On: 03/15/2020 19:49   CT Angio Neck W and/or Wo Contrast  Result Date: 03/16/2020 CLINICAL DATA:  Mental status changes. Frontal headache. Left PCA acute infarction. EXAM: CT ANGIOGRAPHY HEAD AND NECK TECHNIQUE: Multidetector CT imaging of the head and  neck was performed using the standard protocol during bolus administration of intravenous contrast. Multiplanar CT image reconstructions and MIPs were obtained to evaluate the vascular anatomy. Carotid stenosis measurements (when applicable) are obtained utilizing NASCET criteria, using the distal internal carotid diameter as the denominator. CONTRAST:  83mL OMNIPAQUE IOHEXOL 350 MG/ML SOLN COMPARISON:  MRI studies earlier same day.  CT late yesterday. FINDINGS: CTA NECK FINDINGS Aortic arch: Aortic atherosclerosis. Branching pattern is normal without origin stenosis. Right carotid system: Common carotid artery widely patent to the bifurcation. Calcified plaque at the carotid bifurcation and ICA bulb. Minimal diameter of the ICA is the same as the more distal cervical ICA, therefore no stenosis. Left carotid system: Common carotid artery widely patent to the bifurcation. Calcified plaque at carotid bifurcation and ICA bulb. Minimal diameter in the distal bulb 1.8 mm. Compared to a more distal cervical ICA diameter of 4.5 mm, this indicates a 60% stenosis. Vertebral arteries: Mild atherosclerotic change at both vertebral artery origins with stenosis estimated at 30%. Beyond that, both vertebral arteries are patent through the cervical region to the foramen magnum. Skeleton: Ordinary cervical spondylosis and facet arthritis. Other neck: No mass or lymphadenopathy. Upper chest: Negative Review of the MIP images confirms the above findings CTA HEAD FINDINGS Anterior circulation: Both internal carotid arteries are patent through the skull base and siphon regions. Ordinary atherosclerotic calcification in the carotid siphons but no stenosis greater than 30%. The anterior and middle cerebral vessels are patent. 30% stenosis of the distal right M1 segment. 50% stenosis of the left M1 segment. Tiny posterior communicating artery on the left. Posterior circulation: Dominant right vertebral artery is widely patent through the  foramen magnum. There is atherosclerotic disease in the V4 segment with 50% stenosis. Beyond that, the vessel is patent to the basilar. Non dominant left vertebral artery is patent through the foramen magnum and supplies PICA. Severe stenosis distal to that but with some antegrade flow. No basilar stenosis. Superior cerebellar arteries show flow. Right posterior cerebral artery shows flow, with a moderate stenosis at the P2 P3 junction. Left posterior cerebral artery is occluded immediately beyond its origin. Venous sinuses: Patent and normal. Anatomic variants: None significant. IMPRESSION: 1. Aortic atherosclerosis. 2. Atherosclerotic disease at both carotid bifurcations. No measurable stenosis on the right. 60% stenosis of the distal bulb on the left. 3. 30% stenosis of both vertebral artery origins. 50% stenosis of the right V4 segment. Severe stenosis of the left vertebral artery just beyond PICA, but with some antegrade flow. 4. 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 Stenoses in the right posterior cerebral artery, moderate at the P2 P3 junction. 5. 30% stenosis of the distal right M1 segment. 50% stenosis of the left M1 segment. Aortic Atherosclerosis (ICD10-I70.0). Electronically Signed  By: Paulina Fusi M.D.   On: 03/16/2020 03:06   MR BRAIN WO CONTRAST  Result Date: 03/16/2020 CLINICAL DATA:  Mental status changes.  Frontal headache. EXAM: MRI HEAD WITHOUT CONTRAST TECHNIQUE: Multiplanar, multiecho pulse sequences of the brain and surrounding structures were obtained. COMPARISON:  None. FINDINGS: MR HEAD FINDINGS Brain: Acute infarction in the left PCA territory affecting the posteromedial temporal lobe and occipital lobe. Minimal petechial blood products. No frank hematoma. No other acute infarction identified. There is an old infarction at the left temporoparietal junction with atrophy and encephalomalacia. No sign of widespread small-vessel disease. No hydrocephalus,  mass or extra-axial collection. Vascular: Major vessels at the base of the brain show flow. However, there may be abnormal flow in the left vertebral artery. Skull and upper cervical spine: Negative Sinuses/Orbits: No significant sinus disease.  Orbits negative. Other: None IMPRESSION: 1. Acute infarction in the left PCA territory affecting the left posteromedial temporal lobe and occipital lobe. Minimal petechial blood products. No frank hematoma. 2. 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 3. Question abnormal flow in the left vertebral artery. Electronically Signed   By: Paulina Fusi M.D.   On: 03/16/2020 01:24   MR MRV HEAD W WO CONTRAST  Result Date: 03/16/2020 CLINICAL DATA:  Mental status changes of unknown cause. EXAM: MR VENOGRAM HEAD WITHOUT AND WITH CONTRAST TECHNIQUE: Angiographic images of the intracranial venous structures were obtained using MRV technique without and with intravenous contrast. CONTRAST:  9.51mL GADAVIST GADOBUTROL 1 MMOL/ML IV SOLN COMPARISON:  MRI brain earlier same day FINDINGS: Intracranial venous structures are normal. Superior sagittal sinus is normal. Transverse sinuses are normal. Sigmoid sinuses are normal. Both jugular veins are patent. Deep veins are patent. Some enhancement noted of the sulci in the left lateral occipital lobe consistent with subacute infarction in that region. No enhancement seen in the region of acute left PCA infarction. IMPRESSION: 1. Normal intracranial MR venography. 2. Evidence of subacute infarction in the left lateral occipital lobe, with cortical enhancement. No enhancement seen in the region of acute infarction. See results of previous MRI. Electronically Signed   By: Paulina Fusi M.D.   On: 03/16/2020 02:01    EKG: Personally reviewed.  Rhythm is sinus arrhythmia at 107 bpm. PVC noted, evidence of Q waves in the inferior leads. No dynamic ST segment changes appreciated.  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  Case discussed with Dr. Iver Nestle with neurology. Recommendations are appreciated.  Initiating aspirin 325 mg followed by 81 mg daily.  Neurology is recommending consideration of initiation of Plavix after echocardiogram has resulted and patient confirms there is no evidence of bleeding  Serial neurologic checks  Monitoring patient on telemetry  Permissive hypertension  Echocardiogram in the morning  PT, OT, SLP evaluations  Active Problems:   Essential hypertension   Permissive hypertension for now  Intravenous interventions for markedly elevated blood pressures.    Polycythemia   Notable elevation of hemoglobin hematocrit that trends all the way back to 2010  Concern for polycythemia  Hydrating patient with intravenous fluids  Monitoring hemoglobin hematocrit with serial CBCs  Obtaining erythropoietin level  Will discuss further symptomatology with patient once he is more awake and alert for consideration of sending off a JAK2 level.  Likely outpatient follow-up    Hypercalcemia   Mild hypercalcemia likely secondary to volume depletion  Intravenous volume resuscitation  Monitoring with serial chemistries    Controlled type 2 diabetes mellitus without complication, without long-term current use of insulin (HCC)   Review of labs of care were reveals that patient had a hemoglobin A1c of 6.5 in 08/2017  Patient apparently is diet-controlled with last hemoglobin A1c found to be 6.3%.  Accu-Cheks before every meal and nightly with sliding scale insulin  Hemoglobin A1c pending     Code Status:  Full code Family Communication: Daughter is at bedside who has been updated on plan of care.  Status is:  Observation  The patient remains OBS appropriate and will d/c before 2 midnights.  Dispo: The patient is from: Home              Anticipated d/c is to: Home              Patient currently is not medically stable to d/c.   Difficult to place patient No        Marinda Elk MD [facility] Pager [phone]  If 7PM-7AM, please contact night-coverage [email or web address] Use universal [place] password for that web site. If you do not have the password, please call the hospital operator.  03/16/2020, 3:20 AM

## 2020-03-16 NOTE — Evaluation (Signed)
Physical Therapy Evaluation Patient Details Name: [name] MRN: [id number] DOB: [birth year] Today's Date: 03/16/2020   History of Present Illness  70 year old male with past medical history of hypertension, diabetes mellitus type 2 (diet controlled), coronary artery disease (MI approx 25 yrs ago S/P PCI) who presents to [facility] emergency department with complaints of headache and difficulty with speech. 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    Clinical Impression  Pt admitted with above diagnosis. PTA pt lived alone, independent. On eval, he required supervision transfers and supervision ambulation 250' without AD. Steady gait. Difficulty problem solving during amb for maneuvering around obstacles. He demonstrates difficulty with word finding and higher level cognitive tasks. No deficits noted in strength or balance. Pt currently with functional limitations due to the deficits listed below (see PT Problem List). Pt will benefit from skilled PT to increase their independence and safety with mobility to allow discharge home. PT to follow acutely. No follow up services indicated but pt would benefit from 24-hour supervision due to cognitive deficits.        Follow Up Recommendations No PT follow up;Supervision/Assistance - 24 hour    Equipment Recommendations  None recommended by PT    Recommendations for Other Services       Precautions / Restrictions Precautions Precautions: Fall      Mobility  Bed Mobility Overal bed mobility: Modified Independent                  Transfers Overall transfer level: Needs assistance Equipment used: None Transfers: Sit to/from [employer] Transfers Sit to Stand: Supervision Stand pivot transfers: Supervision       General transfer comment: supervision for safety  Ambulation/Gait Ambulation/Gait assistance: Supervision;Min  guard Gait Distance (Feet): 250 Feet Assistive device: None Gait Pattern/deviations: WFL(Within Functional Limits) Gait velocity: WFL Gait velocity interpretation: >2.62 ft/sec, indicative of community ambulatory General Gait Details: initially provided min guard assist but progressed to supervision. Steady gait but difficulty problem solving to maneuver around obstacles.  Stairs            Wheelchair Mobility    Modified Rankin (Stroke Patients Only) Modified Rankin (Stroke Patients Only) Pre-Morbid Rankin Score: No symptoms Modified Rankin: Slight disability     Balance Overall balance assessment: No apparent balance deficits (not formally assessed)                                           Pertinent Vitals/Pain Pain Assessment: No/denies pain    Home Living Family/patient expects to be discharged to:: Private residence Living Arrangements: Alone Available Help at Discharge: Friend(s);Available PRN/intermittently;Family Type of Home: House Home Access: Stairs to enter Entrance Stairs-Rails: None Entrance Stairs-Number of Steps: 2 Home Layout: One level Home Equipment: None      Prior Function Level of Independence: Independent         Comments: lives alone with his dog [employer] 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     Hand Dominance        Extremity/Trunk Assessment   Upper Extremity Assessment Upper Extremity Assessment: Defer to OT evaluation    Lower Extremity Assessment Lower Extremity Assessment: Overall WFL for tasks assessed    Cervical / Trunk Assessment Cervical / Trunk Assessment: Normal  Communication   Communication: No difficulties  Cognition Arousal/Alertness: Awake/alert Behavior During Therapy: WFL for tasks  assessed/performed Overall Cognitive Status: Impaired/Different from baseline Area of Impairment: Memory;Awareness;Problem solving;Safety/judgement                     Memory: Decreased short-term memory    Safety/Judgement: Decreased awareness of deficits;Decreased awareness of safety Awareness: Emergent Problem Solving: Slow processing;Difficulty sequencing;Requires verbal cues General Comments: Difficulty scanning his environment to find objects/location of room. Word finding difficulty. Higher level cognitive deficits.      General Comments General comments (skin integrity, edema, etc.): SpO2 94% on RA    Exercises     Assessment/Plan    PT Assessment Patient needs continued PT services  PT Problem List Decreased mobility;Decreased safety awareness;Decreased knowledge of precautions;Decreased cognition       PT Treatment Interventions Therapeutic activities;Cognitive remediation;Gait training;Therapeutic exercise;Patient/family education;Stair training;Functional mobility training    PT Goals (Current goals can be found in the Care Plan section)  Acute Rehab PT Goals Patient Stated Goal: home PT Goal Formulation: With patient Time For Goal Achievement: 03/30/20 Potential to Achieve Goals: Good    Frequency Min 4X/week   Barriers to discharge        Co-evaluation               AM-PAC PT "6 Clicks" Mobility  Outcome Measure Help needed turning from your back to your side while in a flat bed without using bedrails?: None Help needed moving from lying on your back to sitting on the side of a flat bed without using bedrails?: None Help needed moving to and from a bed to a chair (including a wheelchair)?: A Little Help needed standing up from a chair using your arms (e.g., wheelchair or bedside chair)?: A Little Help needed to walk in hospital room?: A Little Help needed climbing 3-5 steps with a railing? : A Little 6 Click Score: 20    End of Session Equipment Utilized During Treatment: Gait belt Activity Tolerance: Patient tolerated treatment well Patient left: in chair;with call bell/phone within reach;with chair alarm set Nurse Communication: Mobility  status PT Visit Diagnosis: Difficulty in walking, not elsewhere classified (R26.2)    Time: [id number] PT Time Calculation (min) (ACUTE ONLY): 28 min   Charges:   PT Evaluation $PT Eval Moderate Complexity: 1 Mod PT Treatments $Gait Training: 8-22 mins        Aida Raider, PT  Office # [phone] Pager [phone]   Ilda Foil 03/16/2020, 10:35 AM

## 2020-03-17 DIAGNOSIS — I10 Essential (primary) hypertension: Secondary | ICD-10-CM | POA: Diagnosis not present

## 2020-03-17 LAB — GLUCOSE, CAPILLARY: Glucose-Capillary: 164 mg/dL — ABNORMAL HIGH (ref 70–99)

## 2020-03-17 LAB — ERYTHROPOIETIN: Erythropoietin: 8.8 m[IU]/mL (ref 2.6–18.5)

## 2020-03-17 MED ORDER — ASPIRIN 81 MG PO TBEC: 81.0000 mg | DELAYED_RELEASE_TABLET | Freq: Every day | ORAL | 2 refills | Status: AC

## 2020-03-17 MED ORDER — LISINOPRIL 20 MG PO TABS: 20.0000 mg | ORAL_TABLET | Freq: Every day | ORAL | Status: DC

## 2020-03-17 MED ORDER — HYDROCHLOROTHIAZIDE 25 MG PO TABS: 25.0000 mg | ORAL_TABLET | Freq: Every day | ORAL | Status: DC

## 2020-03-17 MED ORDER — CLOPIDOGREL BISULFATE 75 MG PO TABS: 75.0000 mg | ORAL_TABLET | Freq: Every day | ORAL | 2 refills | Status: DC

## 2020-03-17 MED ORDER — CLOPIDOGREL BISULFATE 75 MG PO TABS
75.0000 mg | ORAL_TABLET | Freq: Every day | ORAL | Status: DC
  Administered 2020-03-17: 75 mg via ORAL
  Filled 2020-03-17: qty 1

## 2020-03-17 NOTE — Care Management Obs Status (Signed)
MEDICARE OBSERVATION STATUS NOTIFICATION   Patient Details  Name: THEODOROS STJAMES MRN: [id number] Date of Birth: 09-Apr-1950   Medicare Observation Status Notification Given:  Yes    Lawerance Sabal, RN 03/17/2020, 11:17 AM

## 2020-03-17 NOTE — Discharge Summary (Signed)
Physician Discharge Summary  [name]:[id number] DOB: 1950/07/29 DOA: 03/15/2020  PCP: Oneita Hurt, No  Admit date: 03/15/2020 Discharge date: 03/17/2020    Admitted From: Home Disposition: Home  Recommendations for Outpatient Follow-up:  1. Follow up with PCP in 1-2 weeks 2. Please obtain BMP/CBC in one week 3. Please follow up with your PCP on the following pending results: Unresulted Labs (From admission, onward)          Start     Ordered   03/16/20 0500  Erythropoietin  Tomorrow morning,   R        03/16/20 0315            Home Health: None Equipment/Devices: None  Discharge Condition: Stable CODE STATUS: Full code Diet recommendation: Cardiac  Subjective: Seen and examined.  Daughter at the bedside.  Patient states that he feels much better.  He was fully alert and oriented.  He did have a little word finding difficulty but had improved significantly from yesterday according to the daughter.  Wants to go home.  Brief/Interim Summary: 70 year old male with past medical history of hypertension, diabetes mellitus type 2 (diet controlled), coronary artery disease (MI approx 25 yrs ago S/P PCI) who presented to [facility] emergency department with complaints of headache and difficulty with speech.  According to the history provided by the daughter, on the evening of 2/25 at approximately 6:30 PM she was contacted by her father due to sudden onset severe headache. While she was speaking to her father on the phone she noticed that he was having difficulty with speaking, particularly in word finding. She denies any slurring of speech however. According to the daughter the patient had no concurrent unilateral weakness, facial droop difficulty with balance or changes in vision.  Patient was promptly brought to the [facility] emergency department by family friend. Upon arrival, there was immediate concern for stroke however TPA was not administered due to intermittent  symptoms. Attempts were made to send patient MRI but unfortunately patient could not tolerate the test. MRI department unfortunately is no longer available for the evening at [facility] and the patient was transferred from the [facility] emergency department to the [facility] emergency department for further prompt evaluation. Patient was administered Ativan and underwent MRI at [facility] revealing an acute infarct in the left posterior cerebral artery territory with minimal parenchymal blood products. Patient was promptly evaluated by Dr. Iver Nestle with neurology.  He was subsequently admitted to hospital service.  Transthoracic echo was done which ruled out any valvular defect or FVO.  He was evaluated by PT OT and due to cognitive deficits, they recommended 24-hour supervision.  He was followed by neurology.  Patient however was already taking aspirin full dose 325 mg p.o. daily.  CT angiogram of the head and neck was done which showed left posterior cerebral artery occluded immediately beyond its origin.  Stenosis in the right posterior cerebral artery, moderate at the P2 P3 junction.  30% stenosis of the distal right M1 segment.  50% stenosis of the left M1 segment.  Severe stenosis of the left vertebral artery just beyond PICA, but with some antegrade flow.  He was seen by neurology once again, last seen yesterday after CTA results were available.  They had initially recommended DAPT for 3 weeks and then stop Plavix but continue aspirin however patient was already taking aspirin 325 mg p.o. daily.  Due to this, I had a lengthy discussion with neurologist Dr. Thomasena Edis and  I made sure that he was aware of the CT angiogram results with multiple occlusions and the previous recommendations of DAPT 3 weeks.  He then updated his recommendations and recommended to continue DAPT for 3 months and then stopping aspirin but continue Plavix.  He also recommended that patient follows up with neuroradiology Dr. Corliss Skains as  outpatient.  He had recommended against having any neuroradiology or neuro intervention consultations while inpatient.  I had a lengthy discussion with patient and his wife at the bedside.  Patient was seen once again by PT today and they had updated the recommendations that now patient has improved significantly to the point that he can go home.  He is being discharged in stable condition.  Discharge Diagnoses:  Principal Problem:   Acute stroke due to occlusion of left posterior cerebral artery [facility]) Active Problems:   Essential hypertension   Polycythemia   Hypercalcemia   Controlled type 2 diabetes mellitus without complication, without long-term current use of insulin [facility])    Discharge Instructions  Discharge Instructions    Diet - low sodium heart healthy   Complete by: As directed    Increase activity slowly   Complete by: As directed      Allergies as of 03/17/2020      Reactions   Ciprofloxacin Swelling   Ear canal swelling ciprofloxacin-dexAMETHasone (CIPRODEX)       Medication List    STOP taking these medications   ibuprofen 800 MG tablet Commonly known as: ADVIL     TAKE these medications   aspirin 81 MG EC tablet Take 1 tablet (81 mg total) by mouth daily. Swallow whole. What changed:   medication strength  how much to take  additional instructions   atorvastatin 80 MG tablet Commonly known as: LIPITOR Take 1 tablet (80 mg total) by mouth daily.   clopidogrel 75 MG tablet Commonly known as: Plavix Take 1 tablet (75 mg total) by mouth daily.   hydrochlorothiazide 25 MG tablet Commonly known as: HYDRODIURIL Take 1 tablet (25 mg total) by mouth daily. Start taking on: March 18, 2020   lisinopril 20 MG tablet Commonly known as: ZESTRIL Take 1 tablet (20 mg total) by mouth daily. Start taking on: March 18, 2020   metFORMIN 500 MG tablet Commonly known as: Glucophage Take 1 tablet (500 mg total) by mouth 2 (two) times daily with a  meal.   potassium chloride SA 20 MEQ tablet Commonly known as: KLOR-CON Take 1 tablet (20 mEq total) by mouth daily. Start taking on: March 22, 2020 What changed: These instructions start on March 22, 2020. If you are unsure what to do until then, ask your doctor or other care provider.       Follow-up Information    Julieanne Cotton, MD Follow up in 2 week(s).   Specialties: Interventional Radiology, Radiology Contact information: [address] [place] [address] [phone]        Micki Riley, MD Follow up in 4 week(s).   Specialties: Neurology, Radiology Contact information: [address] [place] [address] [phone]              Allergies  Allergen Reactions  . Ciprofloxacin Swelling    Ear canal swelling ciprofloxacin-dexAMETHasone (CIPRODEX)     Consultations: Neurology   Procedures/Studies: CT Angio Head W or Wo Contrast  Result Date: 03/16/2020 CLINICAL DATA:  Mental status changes. Frontal headache. Left PCA acute infarction. EXAM: CT ANGIOGRAPHY HEAD AND NECK TECHNIQUE: Multidetector CT imaging  of the head and neck was performed using the standard protocol during bolus administration of intravenous contrast. Multiplanar CT image reconstructions and MIPs were obtained to evaluate the vascular anatomy. Carotid stenosis measurements (when applicable) are obtained utilizing NASCET criteria, using the distal internal carotid diameter as the denominator. CONTRAST:  83mL OMNIPAQUE IOHEXOL 350 MG/ML SOLN COMPARISON:  MRI studies earlier same day.  CT late yesterday. FINDINGS: CTA NECK FINDINGS Aortic arch: Aortic atherosclerosis. Branching pattern is normal without origin stenosis. Right carotid system: Common carotid artery widely patent to the bifurcation. Calcified plaque at the carotid bifurcation and ICA bulb. Minimal diameter of the ICA is the same as the more distal cervical ICA, therefore no stenosis. Left carotid system: Common  carotid artery widely patent to the bifurcation. Calcified plaque at carotid bifurcation and ICA bulb. Minimal diameter in the distal bulb 1.8 mm. Compared to a more distal cervical ICA diameter of 4.5 mm, this indicates a 60% stenosis. Vertebral arteries: Mild atherosclerotic change at both vertebral artery origins with stenosis estimated at 30%. Beyond that, both vertebral arteries are patent through the cervical region to the foramen magnum. Skeleton: Ordinary cervical spondylosis and facet arthritis. Other neck: No mass or lymphadenopathy. Upper chest: Negative Review of the MIP images confirms the above findings CTA HEAD FINDINGS Anterior circulation: Both internal carotid arteries are patent through the skull base and siphon regions. Ordinary atherosclerotic calcification in the carotid siphons but no stenosis greater than 30%. The anterior and middle cerebral vessels are patent. 30% stenosis of the distal right M1 segment. 50% stenosis of the left M1 segment. Tiny posterior communicating artery on the left. Posterior circulation: Dominant right vertebral artery is widely patent through the foramen magnum. There is atherosclerotic disease in the V4 segment with 50% stenosis. Beyond that, the vessel is patent to the basilar. Non dominant left vertebral artery is patent through the foramen magnum and supplies PICA. Severe stenosis distal to that but with some antegrade flow. No basilar stenosis. Superior cerebellar arteries show flow. Right posterior cerebral artery shows flow, with a moderate stenosis at the P2 P3 junction. Left posterior cerebral artery is occluded immediately beyond its origin. Venous sinuses: Patent and normal. Anatomic variants: None significant. IMPRESSION: 1. Aortic atherosclerosis. 2. Atherosclerotic disease at both carotid bifurcations. No measurable stenosis on the right. 60% stenosis of the distal bulb on the left. 3. 30% stenosis of both vertebral artery origins. 50% stenosis of the  right V4 segment. Severe stenosis of the left vertebral artery just beyond PICA, but with some antegrade flow. 4. 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 Stenoses in the right posterior cerebral artery, moderate at the P2 P3 junction. 5. 30% stenosis of the distal right M1 segment. 50% stenosis of the left M1 segment. Aortic Atherosclerosis (ICD10-I70.0). Electronically Signed   By: Paulina Fusi M.D.   On: 03/16/2020 03:06   CT HEAD WO CONTRAST  Result Date: 03/15/2020 CLINICAL DATA:  Headache.  Mental status change, cause unknown. EXAM: CT HEAD WITHOUT CONTRAST TECHNIQUE: Contiguous axial images were obtained from the base of the skull through the vertex without intravenous contrast. COMPARISON:  None. FINDINGS: Brain: Left the occipital encephalomalacia. No evidence of large-territorial acute infarction. No parenchymal hemorrhage. No mass lesion. No extra-axial collection. No mass effect or midline shift. No hydrocephalus. Basilar cisterns are patent. Vascular: No hyperdense vessel. Atherosclerotic calcifications are present within the cavernous internal carotid arteries. Skull: No acute fracture or focal lesion. Sinuses/Orbits: Paranasal sinuses and mastoid air cells are  clear. The orbits are unremarkable. Other: None. IMPRESSION: No acute intracranial abnormality. Electronically Signed   By: Tish Frederickson M.D.   On: 03/15/2020 19:49   CT Angio Neck W and/or Wo Contrast  Result Date: 03/16/2020 CLINICAL DATA:  Mental status changes. Frontal headache. Left PCA acute infarction. EXAM: CT ANGIOGRAPHY HEAD AND NECK TECHNIQUE: Multidetector CT imaging of the head and neck was performed using the standard protocol during bolus administration of intravenous contrast. Multiplanar CT image reconstructions and MIPs were obtained to evaluate the vascular anatomy. Carotid stenosis measurements (when applicable) are obtained utilizing NASCET criteria, using the distal internal carotid  diameter as the denominator. CONTRAST:  78mL OMNIPAQUE IOHEXOL 350 MG/ML SOLN COMPARISON:  MRI studies earlier same day.  CT late yesterday. FINDINGS: CTA NECK FINDINGS Aortic arch: Aortic atherosclerosis. Branching pattern is normal without origin stenosis. Right carotid system: Common carotid artery widely patent to the bifurcation. Calcified plaque at the carotid bifurcation and ICA bulb. Minimal diameter of the ICA is the same as the more distal cervical ICA, therefore no stenosis. Left carotid system: Common carotid artery widely patent to the bifurcation. Calcified plaque at carotid bifurcation and ICA bulb. Minimal diameter in the distal bulb 1.8 mm. Compared to a more distal cervical ICA diameter of 4.5 mm, this indicates a 60% stenosis. Vertebral arteries: Mild atherosclerotic change at both vertebral artery origins with stenosis estimated at 30%. Beyond that, both vertebral arteries are patent through the cervical region to the foramen magnum. Skeleton: Ordinary cervical spondylosis and facet arthritis. Other neck: No mass or lymphadenopathy. Upper chest: Negative Review of the MIP images confirms the above findings CTA HEAD FINDINGS Anterior circulation: Both internal carotid arteries are patent through the skull base and siphon regions. Ordinary atherosclerotic calcification in the carotid siphons but no stenosis greater than 30%. The anterior and middle cerebral vessels are patent. 30% stenosis of the distal right M1 segment. 50% stenosis of the left M1 segment. Tiny posterior communicating artery on the left. Posterior circulation: Dominant right vertebral artery is widely patent through the foramen magnum. There is atherosclerotic disease in the V4 segment with 50% stenosis. Beyond that, the vessel is patent to the basilar. Non dominant left vertebral artery is patent through the foramen magnum and supplies PICA. Severe stenosis distal to that but with some antegrade flow. No basilar stenosis.  Superior cerebellar arteries show flow. Right posterior cerebral artery shows flow, with a moderate stenosis at the P2 P3 junction. Left posterior cerebral artery is occluded immediately beyond its origin. Venous sinuses: Patent and normal. Anatomic variants: None significant. IMPRESSION: 1. Aortic atherosclerosis. 2. Atherosclerotic disease at both carotid bifurcations. No measurable stenosis on the right. 60% stenosis of the distal bulb on the left. 3. 30% stenosis of both vertebral artery origins. 50% stenosis of the right V4 segment. Severe stenosis of the left vertebral artery just beyond PICA, but with some antegrade flow. 4. 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 Stenoses in the right posterior cerebral artery, moderate at the P2 P3 junction. 5. 30% stenosis of the distal right M1 segment. 50% stenosis of the left M1 segment. Aortic Atherosclerosis (ICD10-I70.0). Electronically Signed   By: Paulina Fusi M.D.   On: 03/16/2020 03:06   MR BRAIN WO CONTRAST  Result Date: 03/16/2020 CLINICAL DATA:  Mental status changes.  Frontal headache. EXAM: MRI HEAD WITHOUT CONTRAST TECHNIQUE: Multiplanar, multiecho pulse sequences of the brain and surrounding structures were obtained. COMPARISON:  None. FINDINGS: MR HEAD FINDINGS Brain: Acute infarction  in the left PCA territory affecting the posteromedial temporal lobe and occipital lobe. Minimal petechial blood products. No frank hematoma. No other acute infarction identified. There is an old infarction at the left temporoparietal junction with atrophy and encephalomalacia. No sign of widespread small-vessel disease. No hydrocephalus, mass or extra-axial collection. Vascular: Major vessels at the base of the brain show flow. However, there may be abnormal flow in the left vertebral artery. Skull and upper cervical spine: Negative Sinuses/Orbits: No significant sinus disease.  Orbits negative. Other: None IMPRESSION: 1. Acute infarction  in the left PCA territory affecting the left posteromedial temporal lobe and occipital lobe. Minimal petechial blood products. No frank hematoma. 2. 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 3. Question abnormal flow in the left vertebral artery. Electronically Signed   By: Paulina Fusi M.D.   On: 03/16/2020 01:24   MR MRV HEAD W WO CONTRAST  Result Date: 03/16/2020 CLINICAL DATA:  Mental status changes of unknown cause. EXAM: MR VENOGRAM HEAD WITHOUT AND WITH CONTRAST TECHNIQUE: Angiographic images of the intracranial venous structures were obtained using MRV technique without and with intravenous contrast. CONTRAST:  9.45mL GADAVIST GADOBUTROL 1 MMOL/ML IV SOLN COMPARISON:  MRI brain earlier same day FINDINGS: Intracranial venous structures are normal. Superior sagittal sinus is normal. Transverse sinuses are normal. Sigmoid sinuses are normal. Both jugular veins are patent. Deep veins are patent. Some enhancement noted of the sulci in the left lateral occipital lobe consistent with subacute infarction in that region. No enhancement seen in the region of acute left PCA infarction. IMPRESSION: 1. Normal intracranial MR venography. 2. Evidence of subacute infarction in the left lateral occipital lobe, with cortical enhancement. No enhancement seen in the region of acute infarction. See results of previous MRI. Electronically Signed   By: Paulina Fusi M.D.   On: 03/16/2020 02:01   ECHOCARDIOGRAM COMPLETE BUBBLE STUDY  Result Date: 03/16/2020    ECHOCARDIOGRAM REPORT   Patient Name:   [name] Date of Exam: 03/16/2020 Medical Rec #:  [id number]        Height:       70.0 in Accession #:    [id number]       Weight:       210.0 lb Date of Birth:  July 28, 1950       BSA:          2.131 m Patient Age:    69 years         BP:           148/90 mmHg Patient Gender: M                HR:           66 bpm. Exam Location:  Inpatient Procedure: 2D Echo and Saline Contrast Bubble Study Indications:    stroke  434.91  History:        Patient has no prior history of Echocardiogram examinations.                 Risk Factors:Diabetes and Hypertension.  Sonographer:    Delcie Roch Referring Phys: [id number] Deno Lunger [place] IMPRESSIONS  1. Left ventricular ejection fraction, by estimation, is 55 to 60%. The left ventricle has normal function. The left ventricle has no regional wall motion abnormalities. Left ventricular diastolic parameters are consistent with Grade I diastolic dysfunction (impaired relaxation).  2. Right ventricular systolic function is normal. The right ventricular size is normal.  3. The mitral valve is abnormal. No evidence  of mitral valve regurgitation. The mean mitral valve gradient is 3.0 mmHg with average heart rate of 68 bpm. Moderate mitral annular calcification.  4. The aortic valve is tricuspid. There is mild calcification of the aortic valve. Aortic valve regurgitation is not visualized.  5. The inferior vena cava is normal in size with greater than 50% respiratory variability, suggesting right atrial pressure of 3 mmHg.  6. Agitated saline contrast bubble study was negative, with no evidence of any interatrial shunt. Comparison(s): No prior Echocardiogram. FINDINGS  Left Ventricle: Left ventricular ejection fraction, by estimation, is 55 to 60%. The left ventricle has normal function. The left ventricle has no regional wall motion abnormalities. The left ventricular internal cavity size was normal in size. There is  no left ventricular hypertrophy. Left ventricular diastolic parameters are consistent with Grade I diastolic dysfunction (impaired relaxation). Right Ventricle: The right ventricular size is normal. No increase in right ventricular wall thickness. Right ventricular systolic function is normal. Left Atrium: Left atrial size was normal in size. Right Atrium: Right atrial size was normal in size. Pericardium: There is no evidence of pericardial effusion. Mitral Valve: The mitral  valve is abnormal. There is moderate thickening of the mitral valve leaflet(s). Moderate mitral annular calcification. No evidence of mitral valve regurgitation. The mean mitral valve gradient is 3.0 mmHg with average heart rate of 68 bpm. Tricuspid Valve: The tricuspid valve is normal in structure. Tricuspid valve regurgitation is not demonstrated. No evidence of tricuspid stenosis. Aortic Valve: The aortic valve is tricuspid. There is mild calcification of the aortic valve. There is mild aortic valve annular calcification. Aortic valve regurgitation is not visualized. Pulmonic Valve: The pulmonic valve was not well visualized. Pulmonic valve regurgitation is not visualized. No evidence of pulmonic stenosis. Aorta: The aortic root and ascending aorta are structurally normal, with no evidence of dilitation. Venous: The inferior vena cava is normal in size with greater than 50% respiratory variability, suggesting right atrial pressure of 3 mmHg. IAS/Shunts: The atrial septum is grossly normal. Agitated saline contrast was given intravenously to evaluate for intracardiac shunting. Agitated saline contrast bubble study was negative, with no evidence of any interatrial shunt.  LEFT VENTRICLE PLAX 2D LVIDd:         4.50 cm  Diastology LVIDs:         2.80 cm  LV e' medial:    6.74 cm/s LV PW:         1.00 cm  LV E/e' medial:  16.0 LV IVS:        1.00 cm  LV e' lateral:   9.36 cm/s LVOT diam:     1.50 cm  LV E/e' lateral: 11.5 LV SV:         57 LV SV Index:   27 LVOT Area:     1.77 cm  RIGHT VENTRICLE             IVC RV S prime:     11.90 cm/s  IVC diam: 1.90 cm TAPSE (M-mode): 2.0 cm LEFT ATRIUM             Index       RIGHT ATRIUM           Index LA diam:        3.80 cm 1.78 cm/m  RA Area:     12.10 cm LA Vol (A2C):   61.4 ml 28.81 ml/m RA Volume:   27.90 ml  13.09 ml/m LA Vol (A4C):   59.0 ml  27.69 ml/m LA Biplane Vol: 60.4 ml 28.34 ml/m  AORTIC VALVE LVOT Vmax:   129.00 cm/s LVOT Vmean:  96.000 cm/s LVOT VTI:     0.321 m  AORTA Ao Root diam: 3.00 cm Ao Asc diam:  3.50 cm MITRAL VALVE MV Area (PHT): 3.08 cm     SHUNTS MV Mean grad:  3.0 mmHg     Systemic VTI:  0.32 m MV Decel Time: 246 msec     Systemic Diam: 1.50 cm MV E velocity: 108.00 cm/s MV A velocity: 149.00 cm/s MV E/A ratio:  0.72 Riley Lam MD Electronically signed by Riley Lam MD Signature Date/Time: 03/16/2020/1:47:12 PM    Final       Discharge Exam: Vitals:   03/17/20 0536 03/17/20 0819  BP: (!) 166/87 (!) 166/87  Pulse:  80  Resp: 15 20  Temp: 98.4 F (36.9 C) 98 F (36.7 C)  SpO2: 95% 95%   Vitals:   03/16/20 1458 03/16/20 2115 03/17/20 0536 03/17/20 0819  BP: (!) 156/76 (!) 164/90 (!) 166/87 (!) 166/87  Pulse: 69 79  80  Resp: Temp: 98.3 F (36.8 C) 98.7 F (37.1 C) 98.4 F (36.9 C) 98 F (36.7 C)  TempSrc:  Oral Oral Oral  SpO2: 92% 94% 95% 95%  Weight:      Height:        General: Pt is alert, awake, not in acute distress Cardiovascular: RRR, S1/S2 +, no rubs, no gallops Respiratory: CTA bilaterally, no wheezing, no rhonchi Abdominal: Soft, NT, ND, bowel sounds + Extremities: no edema, no cyanosis    The results of significant diagnostics from this hospitalization (including imaging, microbiology, ancillary and laboratory) are listed below for reference.     Microbiology: Recent Results (from the past 240 hour(s))  Resp Panel by RT-PCR (Flu A&B, Covid) Nasopharyngeal Swab     Status: None   Collection Time: 03/15/20  7:53 PM   Specimen: Nasopharyngeal Swab; Nasopharyngeal(NP) swabs in vial transport medium  Result Value Ref Range Status   SARS Coronavirus 2 by RT PCR NEGATIVE NEGATIVE Final    Comment: (NOTE) SARS-CoV-2 target nucleic acids are NOT DETECTED.  The SARS-CoV-2 RNA is generally detectable in upper respiratory specimens during the acute phase of infection. The lowest concentration of SARS-CoV-2 viral copies this assay can detect is 138 copies/mL. A negative  result does not preclude SARS-Cov-2 infection and should not be used as the sole basis for treatment or other patient management decisions. A negative result may occur with  improper specimen collection/handling, submission of specimen other than nasopharyngeal swab, presence of viral mutation(s) within the areas targeted by this assay, and inadequate number of viral copies(<138 copies/mL). A negative result must be combined with clinical observations, patient history, and epidemiological information. The expected result is Negative.  Fact Sheet for Patients:  BloggerCourse.com  Fact Sheet for Healthcare Providers:  SeriousBroker.it  This test is no t yet approved or cleared by the [place] FDA and  has been authorized for detection and/or diagnosis of SARS-CoV-2 by FDA under an Emergency Use Authorization (EUA). This EUA will remain  in effect (meaning this test can be used) for the duration of the COVID-19 declaration under Section 564(b)(1) of the Act, 21 U.S.C.section 360bbb-3(b)(1), unless the authorization is terminated  or revoked sooner.       Influenza A by PCR NEGATIVE NEGATIVE Final   Influenza B by PCR NEGATIVE NEGATIVE Final    Comment: (NOTE) The Xpert Xpress  SARS-CoV-2/FLU/RSV plus assay is intended as an aid in the diagnosis of influenza from Nasopharyngeal swab specimens and should not be used as a sole basis for treatment. Nasal washings and aspirates are unacceptable for Xpert Xpress SARS-CoV-2/FLU/RSV testing.  Fact Sheet for Patients: BloggerCourse.com  Fact Sheet for Healthcare Providers: SeriousBroker.it  This test is not yet approved or cleared by the [place] FDA and has been authorized for detection and/or diagnosis of SARS-CoV-2 by FDA under an Emergency Use Authorization (EUA). This EUA will remain in effect (meaning this test can be used)  for the duration of the COVID-19 declaration under Section 564(b)(1) of the Act, 21 U.S.C. section 360bbb-3(b)(1), unless the authorization is terminated or revoked.  Performed at [facility], [address] [place], [place] [address]      Labs: BNP (last 3 results) No results for input(s): BNP in the last 8760 hours. Basic Metabolic Panel: Recent Labs  Lab 03/15/20 1902 03/15/20 1913  NA 135 137  K 3.8 4.0  CL 94* 96*  CO2 29  --   GLUCOSE 137* 141*  BUN 14 14  CREATININE 0.92 0.90  CALCIUM 10.9*  --    Liver Function Tests: Recent Labs  Lab 03/15/20 1902  AST 24  ALT 25  ALKPHOS 68  BILITOT 1.4*  PROT 8.8*  ALBUMIN 5.0   No results for input(s): LIPASE, AMYLASE in the last 168 hours. No results for input(s): AMMONIA in the last 168 hours. CBC: Recent Labs  Lab 03/15/20 1902 03/15/20 1913  WBC 8.8  --   NEUTROABS 7.3  --   HGB 17.9* 18.0*  HCT 52.4* 53.0*  MCV 92.7  --   PLT 297  --    Cardiac Enzymes: No results for input(s): CKTOTAL, CKMB, CKMBINDEX, TROPONINI in the last 168 hours. BNP: Invalid input(s): POCBNP CBG: Recent Labs  Lab 03/16/20 0733 03/16/20 1150 03/16/20 1619 03/16/20 2113 03/17/20 0720  GLUCAP 112* 157* 109* 118* 164*   D-Dimer No results for input(s): DDIMER in the last 72 hours. Hgb A1c Recent Labs    03/16/20 0421  HGBA1C 6.5*   Lipid Profile Recent Labs    03/16/20 0421  CHOL 199  HDL 56  LDLCALC 129*  TRIG 71  CHOLHDL 3.6   Thyroid function studies No results for input(s): TSH, T4TOTAL, T3FREE, THYROIDAB in the last 72 hours.  Invalid input(s): FREET3 Anemia work up No results for input(s): VITAMINB12, FOLATE, FERRITIN, TIBC, IRON, RETICCTPCT in the last 72 hours. Urinalysis    Component Value Date/Time   COLORURINE YELLOW 03/15/2020 1913   APPEARANCEUR CLEAR 03/15/2020 1913   LABSPEC 1.006 03/15/2020 1913   PHURINE 7.0 03/15/2020 1913   GLUCOSEU NEGATIVE 03/15/2020 1913    HGBUR SMALL (A) 03/15/2020 1913   BILIRUBINUR NEGATIVE 03/15/2020 1913   KETONESUR NEGATIVE 03/15/2020 1913   PROTEINUR NEGATIVE 03/15/2020 1913   UROBILINOGEN 0.2 08/10/2008 2001   NITRITE NEGATIVE 03/15/2020 1913   LEUKOCYTESUR NEGATIVE 03/15/2020 1913   Sepsis Labs Invalid input(s): PROCALCITONIN,  WBC,  LACTICIDVEN Microbiology Recent Results (from the past 240 hour(s))  Resp Panel by RT-PCR (Flu A&B, Covid) Nasopharyngeal Swab     Status: None   Collection Time: 03/15/20  7:53 PM   Specimen: Nasopharyngeal Swab; Nasopharyngeal(NP) swabs in vial transport medium  Result Value Ref Range Status   SARS Coronavirus 2 by RT PCR NEGATIVE NEGATIVE Final    Comment: (NOTE) SARS-CoV-2 target nucleic acids are NOT DETECTED.  The SARS-CoV-2 RNA is generally detectable  in upper respiratory specimens during the acute phase of infection. The lowest concentration of SARS-CoV-2 viral copies this assay can detect is 138 copies/mL. A negative result does not preclude SARS-Cov-2 infection and should not be used as the sole basis for treatment or other patient management decisions. A negative result may occur with  improper specimen collection/handling, submission of specimen other than nasopharyngeal swab, presence of viral mutation(s) within the areas targeted by this assay, and inadequate number of viral copies(<138 copies/mL). A negative result must be combined with clinical observations, patient history, and epidemiological information. The expected result is Negative.  Fact Sheet for Patients:  BloggerCourse.comhttps://www.fda.gov/media/152166/download  Fact Sheet for Healthcare Providers:  SeriousBroker.ithttps://www.fda.gov/media/152162/download  This test is no t yet approved or cleared by the [place] FDA and  has been authorized for detection and/or diagnosis of SARS-CoV-2 by FDA under an Emergency Use Authorization (EUA). This EUA will remain  in effect (meaning this test can be used) for the duration of  the COVID-19 declaration under Section 564(b)(1) of the Act, 21 U.S.C.section 360bbb-3(b)(1), unless the authorization is terminated  or revoked sooner.       Influenza A by PCR NEGATIVE NEGATIVE Final   Influenza B by PCR NEGATIVE NEGATIVE Final    Comment: (NOTE) The Xpert Xpress SARS-CoV-2/FLU/RSV plus assay is intended as an aid in the diagnosis of influenza from Nasopharyngeal swab specimens and should not be used as a sole basis for treatment. Nasal washings and aspirates are unacceptable for Xpert Xpress SARS-CoV-2/FLU/RSV testing.  Fact Sheet for Patients: BloggerCourse.comhttps://www.fda.gov/media/152166/download  Fact Sheet for Healthcare Providers: SeriousBroker.ithttps://www.fda.gov/media/152162/download  This test is not yet approved or cleared by the [place] FDA and has been authorized for detection and/or diagnosis of SARS-CoV-2 by FDA under an Emergency Use Authorization (EUA). This EUA will remain in effect (meaning this test can be used) for the duration of the COVID-19 declaration under Section 564(b)(1) of the Act, 21 U.S.C. section 360bbb-3(b)(1), unless the authorization is terminated or revoked.  Performed at [facility], [address] [place], [place] [id number]      Time coordinating discharge: Over 30 minutes  SIGNED:   Hughie Clossavi Yanice Maqueda, MD  [facility] 03/17/2020, 10:49 AM  If 7PM-7AM, please contact night-coverage [email or web address]

## 2020-03-17 NOTE — Progress Notes (Addendum)
Physical Therapy Treatment Patient Details Name: [name] MRN: [id number] DOB: Apr 07, 1950 Today's Date: 03/17/2020    History of Present Illness 70 year old male with past medical history of hypertension, diabetes mellitus type 2 (diet controlled), coronary artery disease (MI approx 25 yrs ago S/P PCI) who presents to [facility] emergency department with complaints of headache and difficulty with speech. 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    PT Comments    Improved cognition noted today. Continued difficulties with word finding. Pt demonstrated mod I bed mobility and transfers. Supervision 300' without AD. No LOB noted. Pt sitting EOB at end of session.     Follow Up Recommendations  No PT follow up;Supervision - Intermittent     Equipment Recommendations  None recommended by PT    Recommendations for Other Services       Precautions / Restrictions Precautions Precautions: Fall Restrictions Weight Bearing Restrictions: No    Mobility  Bed Mobility Overal bed mobility: Modified Independent             General bed mobility comments: no assist needed    Transfers Overall transfer level: Modified independent Equipment used: None   Sit to Stand: Modified independent (Device/Increase time) Stand pivot transfers: Modified independent (Device/Increase time)       General transfer comment: Pt demo safe technique.  Ambulation/Gait Ambulation/Gait assistance: Supervision Gait Distance (Feet): 300 Feet Assistive device: None Gait Pattern/deviations: WFL(Within Functional Limits) Gait velocity: WFL Gait velocity interpretation: >2.62 ft/sec, indicative of community ambulatory General Gait Details: steady gait without AD, supervision for safety, no LOB noted, Improved ability to scan environment and maneuver around obstacles   Stairs             Wheelchair  Mobility    Modified Rankin (Stroke Patients Only) Modified Rankin (Stroke Patients Only) Pre-Morbid Rankin Score: No symptoms Modified Rankin: Slight disability     Balance Overall balance assessment: Mild deficits observed, not formally tested                                          Cognition Arousal/Alertness: Awake/alert Behavior During Therapy: WFL for tasks assessed/performed Overall Cognitive Status: Impaired/Different from baseline Area of Impairment: Memory;Safety/judgement;Problem solving                     Memory: Decreased short-term memory   Safety/Judgement: Decreased awareness of safety;Decreased awareness of deficits   Problem Solving: Difficulty sequencing;Requires verbal cues General Comments: Decreased confusion today. Yesterday's confusion most likely related to multi doses of ativan given overnight. Pt continues to struggle with word finding problems.      Exercises      General Comments General comments (skin integrity, edema, etc.): VSS on RA      Pertinent Vitals/Pain Pain Assessment: No/denies pain    Home Living                      Prior Function            PT Goals (current goals can now be found in the care plan section) Acute Rehab PT Goals Patient Stated Goal: home Progress towards PT goals: Progressing toward goals    Frequency    Min 4X/week      PT Plan Discharge plan needs to be updated  Co-evaluation              AM-PAC PT "6 Clicks" Mobility   Outcome Measure  Help needed turning from your back to your side while in a flat bed without using bedrails?: None Help needed moving from lying on your back to sitting on the side of a flat bed without using bedrails?: None Help needed moving to and from a bed to a chair (including a wheelchair)?: None Help needed standing up from a chair using your arms (e.g., wheelchair or bedside chair)?: None Help needed to walk in hospital  room?: None Help needed climbing 3-5 steps with a railing? : A Little 6 Click Score: 23    End of Session Equipment Utilized During Treatment: Gait belt Activity Tolerance: Patient tolerated treatment well Patient left: in bed;with call bell/phone within reach Nurse Communication: Mobility status PT Visit Diagnosis: Difficulty in walking, not elsewhere classified (R26.2)     Time: 8315-1761 PT Time Calculation (min) (ACUTE ONLY): 14 min  Charges:  $Gait Training: 8-22 mins                     Aida Raider, PT  Office # [phone] Pager [phone]    Ilda Foil 03/17/2020, 10:27 AM

## 2020-03-17 NOTE — Care Management (Signed)
Spoke w patient over the phone to offer resources for substance and ETOH abuse. He states he does not need them because he knows how much he can take. He also said he was "done with them." CM offered him resources for meetings to provide him support and he again declined.

## 2020-03-17 NOTE — Discharge Instructions (Signed)
Driving After a Stroke Driving can be dangerous after a stroke because a stroke can cause physical, emotional, cognitive, and behavioral changes. Damage to your brain and other parts of your nervous system may affect your ability to drive. You may have weakness, stiffness, and pain, and have problems moving, talking, seeing, touching, or problem-solving. A stroke can also cause inability to move (paralysis) on one side of your body. Can I return to driving? Ask your health care provider when it is safe for you to drive. Laws on driving after a stroke vary by state. Your health care provider may recommend that you:  Get a driving evaluation to have your vision, thinking, reaction time, and driving skills tested.  Take a driving rehabilitation program for people who have had a stroke.  Take a driving class or a retraining program.   How is driving affected by a stroke? A family member may be the first to notice that it is not safe for you to drive. You may have problems with:  Your vision.  Talking and communicating.  Weakness, pain, and stiffness in your arms or legs.  Responding to changes on the road.  Using the steering wheel, pedals, and other parts of the car.  Thinking while driving.  Judgment on the road. What are some signs that it may not be safe for me to drive? Signs that driving may be unsafe for you include:  Driving too fast or too slowly.  Needing help from others while driving.  Not paying attention to street signs or signals.  Making bad decisions while driving.  Not keeping enough distance between cars.  Drifting into other lanes.  Becoming confused, angry, or frustrated.  Getting lost in familiar places.  Having accidents while driving. What is adaptive equipment? Adaptive equipment refers to devices that can help people who have had a stroke to drive and do other activities. You may need:  A wheelchair-accessible car.  Special hand controls in the  car.  Pedal extensions for the car.  A seat base to help you stay positioned in your seat.  Lifts and ramps to help you get in and out of the car. Summary  Damage to your brain and other parts of your nervous system may affect your ability to drive.  Ask your health care provider when it is safe for you to drive again. You may need to take steps such as getting a driving evaluation or taking a driving class.  A family member may be the first to notice that it is not safe for you to drive.  You may need adaptive equipment to drive safely. This information is not intended to replace advice given to you by your health care provider. Make sure you discuss any questions you have with your health care provider. Document Revised: 12/18/2016 Document Reviewed: 04/13/2016 Elsevier Patient Education  2021 [employer] Inc.   Eating Plan After Stroke A stroke causes damage to the brain cells, which can affect your ability to walk, talk, and even eat. The impact of a stroke is different for everyone, and so is recovery. A good nutrition plan is important for your recovery. It can also lower your risk of another stroke. If you have difficulty chewing and swallowing your food, a dietitian or your stroke care team can help so that you can enjoy eating healthy foods. What are tips for following this plan? Reading food labels  Choose foods that have less than 300 milligrams (mg) of sodium per serving. Limit  your sodium intake to less than 1,500 mg per day.  Avoid foods that have saturated fat and trans fat.  Choose foods that are low in cholesterol. Limit the amount of cholesterol you eat each day to less than 200 mg.  Choose foods that are high in fiber. Eat 20-30 grams (g) of fiber each day.  Avoid foods with added sugar. Check the food label for ingredients such as sugar, corn syrup, honey, fructose, molasses, and cane juice. Shopping  At the grocery store, buy most of your food from areas near  the walls of the store. This includes: ? Fresh fruits and vegetables. ? Dry grains, beans, nuts, and seeds. ? Fresh seafood, poultry, lean meats, and eggs. ? Low-fat dairy products.  Buy whole ingredients instead of prepackaged foods.  Buy fresh, in-season fruits and vegetables from local farmers markets.  Buy frozen fruits and vegetables in resealable bags. Cooking  Prepare foods with very little salt. Use herbs or salt-free spices instead.  Cook with heart-healthy oils, such as olive, avocado, canola, soybean, or sunflower oil.  Avoid frying foods. Bake, grill, or broil foods instead.  Remove visible fat and skin from meat and poultry before eating.  Modify food textures as told by your health care provider. Meal planning  Eat a wide variety of colorful fruits and vegetables. Make sure one-half of your plate is filled with fruits and vegetables at each meal.  Eat fruits and vegetables that are high in potassium, such as: ? Apples, bananas, oranges, and melon. ? Sweet potatoes, spinach, zucchini, and tomatoes.  Eat fish that contain heart-healthy fats (omega-3 fats) at least twice a week. These include salmon, tuna, mackerel, and sardines.  Eat plant foods that are high in omega-3 fats, such as flaxseeds and walnuts. Add these to cereals, yogurt, or pasta dishes.  Eat several servings of high-fiber foods each day, such as fruits, vegetables, whole grains, and beans.  Do not put salt at the table for meals.  When eating out at restaurants: ? Ask the server about low-salt or salt-free food options. ? Avoid fried foods. Look for menu items that are grilled, steamed, broiled, or roasted. ? Ask if your food can be prepared without butter. ? Ask for condiments, such as salad dressings, gravy, or sauces to be served on the side.  If you have difficulty swallowing: ? Choose foods that are softer and easier to chew and swallow. ? Cut foods into small pieces and chew well before  swallowing. ? Thicken liquids as told by your health care provider or dietitian. ? Let your health care provider know if your condition does not improve over time. You may need to work with a speech therapist to re-train the muscles that are used for eating. General recommendations  Involve your family and friends in your recovery, if possible. It may be helpful to have a slower meal time and to plan meals that include foods everyone in the family can eat.  Brush your teeth with fluoride toothpaste twice a day, and floss once a day. Keeping a clean mouth can help you swallow and can also help your appetite.  Drink enough water each day to keep your urine pale yellow. If needed, set reminders or ask your family to help you remember to drink water.  Limit alcohol intake to no more than 1 drink a day for nonpregnant women and 2 drinks a day for men. One drink equals 12 oz of beer, 5 oz of wine, or 1 oz  of hard liquor.   Summary  Following this eating plan can help in your stroke recovery and can decrease your risk for another stroke.  Let your health care provider know if you have problems with swallowing. You may need to work with a speech therapist. This information is not intended to replace advice given to you by your health care provider. Make sure you discuss any questions you have with your health care provider. Document Revised: 04/28/2018 Document Reviewed: 03/15/2017 Elsevier Patient Education  2021 [employer].   Cognitive Rehabilitation After a Stroke After a stroke, you may have various problems with thinking (cognitive disability). The types of problems you have will depend on how severe the stroke was and where it was located in the brain. Problems may include:  Problems with short-term memory.  Trouble paying attention.  Trouble communicating or understanding language (aphasia).  A drop in mental ability that may interfere with daily life (dementia).  Trouble with  problem-solving and information processing.  Problems with reading, writing, or math.  Problems with your ability to plan and to perform activities in sequence (executive function). These problems can feel overwhelming. However, with rehabilitation and time to heal, many people have improvement in their symptoms. What causes cognitive disability? A stroke happens when blood cannot flow to certain areas of the brain. When this happens, brain cells die in the affected areas because they cannot get oxygen and nutrients from the blood. Cognitive disability is caused by the death of cells in the areas of the brain that control thinking. What is cognitive rehabilitation? Cognitive rehabilitation is a program to help you improve your thinking skills after a stroke. Rehabilitation cannot completely reverse the effects of a stroke, but it can help you with memory, problem-solving, and communication skills. Therapy focuses on:  Improving brain function. This may involve activities such as learning to break down tasks into simple steps.  Helping you learn ways to cope with thinking problems. For example, you might learn memory tricks or do activities that stimulate memory, such as naming objects or describing pictures. Cognitive rehabilitation may include:  Speech-language therapy to help you understand and use language to communicate.  Occupational therapy to help you perform daily activities.  Music therapy to help relieve stress, anxiety, and depression. This may involve listening to music, singing, or playing instruments.  Physical therapy to help improve your ability to move and perform actions that involve the muscles (motor functions).   When will therapy start and where will I have therapy? Your health care provider will decide when it is best for you to start therapy. In some cases, people start rehabilitation as soon as their health is stable, which may be 24-48 hours after the  stroke. Rehabilitation can take place in a few different places, based on your needs. It may take place in:  The hospital or an in-patient rehabilitation hospital.  An outpatient rehabilitation facility.  A long-term care facility.  A community rehabilitation clinic.  Your home. What are some tools to help after a stroke? There are a number of tools and apps that you can use on your smartphone, personal computer, or tablet to help improve brain function. Some of these apps include:  Calendar reminders or alarm apps to help with memory.  Note-taking or sketch pad apps to help with memory or communication.  Text-to-speech apps that allow you to listen to what you are reading, which helps your ability to understanding text.  Picture dictionary or picture message apps  to help with communication.  E-readers. These can highlight text as it is read aloud, which helps with listening and reading skills. How can my friends or family help during my rehabilitation? During your recovery, it is important that your friends and family members help you work toward more independence. Your caregivers should speak with your health care providers to learn how they can best help you during recovery. This may include working on speech-language or memory exercises at home, or helping with daily tasks and errands. If you have cognitive disability, you may be at risk for injury or accidents at home, such as forgetting to turn off the stove. Friends and family members can help ensure home safety by taking steps such as getting appliances with automatic shut-off features or storing dangerous objects in a secure place. What else should I know about cognitive rehabilitation after a stroke? Having trouble with memory and problem-solving can make you feel alone. You may also have mood changes, anxiety, or depression after a stroke. It is important to:  Stay connected with others through social groups, online support  groups, or your community.  Talk to your friends, family, and caregivers about any emotional problems you are having.  Go to one-on-one or group therapy as suggested by your health care provider.  Stay physically active and exercise as often as suggested by your health care provider. Summary  After a stroke, some people have problems with thinking that affect attention, memory, language, communication, and problem-solving.  Cognitive rehabilitation is a program to help you regain brain function and learn skills to cope with thinking problems.  Rehabilitation cannot completely reverse the effects of a stroke, but it can help to improve quality of life.  Cognitive rehabilitation may include speech-language therapy, occupational therapy, music therapy, and physical therapy. This information is not intended to replace advice given to you by your health care provider. Make sure you discuss any questions you have with your health care provider. Document Revised: 04/27/2018 Document Reviewed: 04/10/2016 Elsevier Patient Education  2021 Elsevier Inc.   Chronic Migraine Headache A migraine headache is throbbing pain that is usually on one side of the head. Migraines that keep coming back are called recurring migraines. A migraine is called a chronic migraine if it happens at least 15 days in a month for more than 3 months. Talk with your doctor about what things may bring on (trigger) your migraines. What are the causes? The exact cause of this condition is not known. A migraine may be caused when nerves in the brain become irritated and release chemicals that cause irritation and swelling (inflammation) of blood vessels. The irritation and swelling of the blood vessels causes pain. Migraines may be brought on or caused by:  Smoking.  Foods and drinks, such as: ? Cheese. ? Chocolate. ? Alcohol. ? Caffeine.  Certain substances in some foods or drinks.  Some medicines. Other things that  may bring on a migraine include:  Periods, for women.  Stress.  Not enough sleep or too much sleep.  Feeling very tired.  Bright lights or loud noises.  Smells  Weather changes and being at high altitude. What increases the risk? The following factors may make you more likely to have chronic migraine:  Having migraines or family members who have them.  Being very sad (depressed) or feeling worried or nervous (anxious).  Taking a lot of pain medicine.  Having problems sleeping.  Having heart disease, diabetes, or being very overweight (obese). What are the  signs or symptoms? Symptoms of this condition include:  Pain that feels like it throbs.  Pain that is usually only on one side of the head. In some cases, the pain may be on both sides of the head or around the head or neck.  Very bad pain that keeps you from doing daily activities.  Pain that gets worse with activity.  Feeling like you may vomit (feeling nauseous) or vomiting.  Pain when you are around bright lights, loud noises, or activity.  Being sensitive to bright lights, loud noises, or smells.  Feeling dizzy. How is this treated? This condition is treated with:  Medicines. These help to: ? Lessen pain and the feeling like you may vomit. ? Prevent migraines.  Changes to your diet or sleep.  Therapy. This might include: ? Relaxation training. ? Biofeedback. This is a treatment that teaches you to relax, use your brain to lower your heart rate, and control your breathing. ? Cognitive behavioral therapy (CBT). This therapy helps you set goals and follow up on the changes that you make.  Acupuncture.  Using a device that provides electrical stimulation to your nerves, which can help take away pain.  Surgery, if the other treatments do not work. Follow these instructions at home: Medicines  Take over-the-counter and prescription medicines only as told by your doctor.  Ask your doctor if the  medicine prescribed to you requires you to avoid driving or using machinery. Lifestyle  Do not use any products that contain nicotine or tobacco, such as cigarettes, e-cigarettes, and chewing tobacco. If you need help quitting, ask your doctor.  Do not drink alcohol.  Get 7-9 hours of sleep each night.  Lower the stress in your life. Ask your doctor about ways to do this.  Stay at a healthy weight. Talk with your doctor if you need help losing weight.  Get regular exercise.   General instructions  Keep a journal to find out if certain things bring on migraines. For example, write down: ? What you eat and drink. ? How much sleep you get. ? Any change to your diet or medicines.  Lie down in a dark, quiet room when you have a migraine.  Try placing a cool towel over your head when you have a migraine.  Keep lights dim if bright lights bother you or make your migraines worse.  Keep all follow-up visits as told by your doctor. This is important.   Where to find more information  Coalition for Headache and Migraine Patients (CHAMP): headachemigraine.org  American Migraine Foundation: americanmigrainefoundation.org  National Headache Foundation: headaches.org Contact a doctor if:  Medicine does not help your migraine.  Your pain keeps coming back. Get help right away if:  Your migraine becomes really bad and medicine does not help.  You have a stiff neck and fever.  You have trouble seeing.  Your muscles are weak or you lose control of them.  You lose your balance or have trouble walking.  You feel like you will faint or you faint.  You start having sudden, very bad headaches.  You have a seizure. Summary  A migraine headache is very bad, throbbing pain that is usually on one side of the head.  A chronic migraine is a migraine that happens 15 days in a month for more than 3 months.  Talk with your doctor about what things may bring on your migraines.  Lie  down in a dark, quiet room when you have a migraine.  Keep a journal. This can help you find out if certain things make you have migraines. This information is not intended to replace advice given to you by your health care provider. Make sure you discuss any questions you have with your health care provider. Document Revised: 02/22/2019 Document Reviewed: 02/22/2019 Elsevier Patient Education  2021 [employer] Inc.   Cervicogenic Headache  A cervicogenic headache is a headache caused by a condition that affects the bones and tissues in your neck (cervical spine). In a cervicogenic headache, the pain moves from your neck to your head. Most cervicogenic headaches start in the upper part of the neck with the first three cervical bones (cervical vertebrae). A cervicogenic headache is diagnosed when a cause can be found in the cervical spine and other causes of headaches can be ruled out. What are the causes? The most common cause of this condition is a traumatic injury to the cervical spine, such as whiplash. Other causes include:  Arthritis.  Broken bone (fracture).  Infection.  Tumor. What are the signs or symptoms? The most common symptoms are neck and head pain. The pain is often located on one side. In some cases, there may be head pain without neck pain. Pain may be felt in the neck, back or side of the head, face, or behind the eyes. Other symptoms include:  Limited movement in the neck.  Arm or shoulder pain. How is this diagnosed? This condition may be diagnosed based on:  Your symptoms.  A physical exam.  An injection that blocks nerve signals (nerve block).  Imaging tests, such as: ? X-rays. ? CT scan. ? MRI. How is this treated? Treatment for this condition may depend on the underlying condition. Treatment may include:  Medicines, such as: ? NSAIDs. ? Muscle relaxants.  Physical therapy.  Massage therapy.  Complementary therapies, such  as: ? Biofeedback. ? Meditation. ? Acupuncture.  Nerve block injections.  Botulinum toxin injections. Your treatment plan may involve working with a pain management team that includes your primary health care provider, a pain management specialist, a neurologist, and a physical therapist. Follow these instructions at home:  Take over-the-counter and prescription medicines only as told by your health care provider.  Do exercises at home as told by your physical therapist.  Return to your normal activities as told by your health care provider. Ask your health care provider what activities are safe for you. Avoid activities that trigger your headaches.  Maintain good neck support and posture at home and at work.  Keep all follow-up visits as told by your health care provider. This is important. Contact a health care provider if you have:  Headaches that are getting worse and happening more often.  Headaches with any of the following: ? Fever. ? Numbness. ? Weakness. ? Dizziness. ? Nausea or vomiting. Get help right away if:  You have a very sudden and severe headache. Summary  A cervicogenic headache is a headache caused by a condition that affects the bones and tissues in your cervical spine.  Your health care provider may diagnose this condition with a physical exam, a nerve block, and imaging tests.  Treatment may include medicine to reduce pain and inflammation, physical therapy, and nerve block injections.  Complementary therapies, such as acupuncture and meditation, may be added to other treatments.  Your treatment plan may involve working with a pain management team that includes your primary health care provider, a pain management specialist, a neurologist, and a physical therapist. This information  is not intended to replace advice given to you by your health care provider. Make sure you discuss any questions you have with your health care provider. Document  Revised: 11/16/2019 Document Reviewed: 11/16/2019 Elsevier Patient Education  2021 [employer].

## 2020-03-17 NOTE — Progress Notes (Signed)
Completed DC teaching with patient and [name].  Verbalized understanding of follow up appointments as well as medication regimen.  Taken to car via wheelchair.

## 2020-03-18 LAB — GLUCOSE, CAPILLARY: Glucose-Capillary: 116 mg/dL — ABNORMAL HIGH (ref 70–99)

## 2020-03-20 ENCOUNTER — Other Ambulatory Visit (HOSPITAL_COMMUNITY): Payer: Self-pay | Admitting: Interventional Radiology

## 2020-03-20 DIAGNOSIS — I771 Stricture of artery: Secondary | ICD-10-CM

## 2020-03-26 ENCOUNTER — Other Ambulatory Visit: Payer: Self-pay

## 2020-03-26 ENCOUNTER — Ambulatory Visit (HOSPITAL_COMMUNITY)
Admission: RE | Admit: 2020-03-26 | Discharge: 2020-03-26 | Disposition: A | Discharge status: Home / Self Care | Payer: Medicare HMO | Source: Ambulatory Visit | Attending: Interventional Radiology | Admitting: Interventional Radiology

## 2020-03-26 DIAGNOSIS — I771 Stricture of artery: Secondary | ICD-10-CM

## 2020-03-27 ENCOUNTER — Telehealth: Payer: Self-pay

## 2020-03-27 HISTORY — PX: IR RADIOLOGIST EVAL & MGMT: IMG5224

## 2020-03-27 NOTE — Telephone Encounter (Signed)
NOTES ON FILE FROM [facility] [phone], SENT REFERRAL TO SCHEDULING °

## 2020-04-01 ENCOUNTER — Encounter: Payer: Self-pay | Admitting: Neurology

## 2020-04-01 ENCOUNTER — Ambulatory Visit: Payer: Medicare HMO | Admitting: Neurology

## 2020-04-01 VITALS — BP 138/76 | HR 60 | Ht 70.0 in | Wt 203.4 lb

## 2020-04-01 DIAGNOSIS — I63532 Cerebral infarction due to unspecified occlusion or stenosis of left posterior cerebral artery: Secondary | ICD-10-CM | POA: Diagnosis not present

## 2020-04-01 NOTE — Progress Notes (Signed)
Reason for visit: Stroke  Referring physician: [facility]  [name] is a 70 y.o. male  History of present illness:  [name] is a 70 year old right-handed white male with a history of hypertension, diabetes, and coronary artery disease.  The patient presented to the hospital on 16 March 2023 with onset of headache and slurring of speech.  The patient had difficulty with word finding issues as well.  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  He did not receive TPA.  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  The patient had occlusion of the left posterior cerebral artery at its origin.  The patient reported no numbness or weakness of the extremities.  He has had some problems with word finding, he has alexia.  He is working with occupational and speech therapy following the admission.  The patient denies any balance issues but he does have a dense right homonymous visual field deficit, he has not been able to operate a motor vehicle.  Prior to the admission, he was working as a Hospital doctor.  The patient was placed on Metformin in the hospital but this resulted in diarrhea.  He is on a combination of aspirin and Plavix to convert to Plavix only in the next week.  He was on aspirin at the time of the stroke.  He was seen by Dr. Corliss Torres in the hospital, there was some concern that he may need a vertebral artery stent in the future.  His hemoglobin A1c was 6.5.  His LDL cholesterol was 129, he was placed on cholesterol-lowering agents.  He is not a smoker.  He does report that since he has had the stroke that he has some discomfort into the right thigh to the knee with walking, this improves with rest.  Past Medical History:  Diagnosis Date  . Hypertension   . Stroke [facility]) 03/15/2020    Past Surgical History:   Procedure Laterality Date  . IR RADIOLOGIST EVAL & MGMT  03/27/2020    Family History  Problem Relation Age of Onset  . Heart attack Father   . Stroke Paternal Great-grandfather     Social history:  reports that he has quit smoking. He has never used smokeless tobacco. He reports current alcohol use. He reports that he does not use drugs.  Medications:  Prior to Admission medications   Medication Sig Start Date End Date Taking? Authorizing Provider  aspirin 81 MG EC tablet Take 1 tablet (81 mg total) by mouth daily. Swallow whole. 03/17/20 04/16/20 Yes Pahwani, Daleen Bo, MD  atorvastatin (LIPITOR) 80 MG tablet Take 1 tablet (80 mg total) by mouth daily. 03/17/20  Yes Azucena Fallen, MD  clopidogrel (PLAVIX) 75 MG tablet Take 1 tablet (75 mg total) by mouth daily. 03/17/20 06/15/20 Yes Pahwani, Daleen Bo, MD  hydrochlorothiazide (HYDRODIURIL) 25 MG tablet Take 1 tablet (25 mg total) by mouth daily. 03/18/20  Yes Pahwani, Daleen Bo, MD  lisinopril (ZESTRIL) 20 MG tablet Take 1 tablet (20 mg total) by mouth daily. 03/18/20  Yes Hughie Closs, MD  metFORMIN (GLUCOPHAGE) 500 MG tablet Take 1 tablet (500 mg total) by mouth 2 (two) times daily with a meal. 03/16/20 09/12/20 Yes Azucena Fallen, MD  potassium chloride SA (KLOR-CON) 20 MEQ tablet Take 1 tablet (20 mEq total) by mouth daily. 03/22/20  Yes Azucena Fallen,  MD      Allergies  Allergen Reactions  . Ciprofloxacin Swelling    Ear canal swelling ciprofloxacin-dexAMETHasone (CIPRODEX)     ROS:  Out of a complete 14 system review of symptoms, the patient complains only of the following symptoms, and all other reviewed systems are negative.  Right leg pain Vision change Speech alteration  Height 5\' 10"  (1.778 m), weight 203 lb 6.4 oz (92.3 kg).   Blood pressure sitting is 138/76.  Pulse is 60.  Physical Exam  General: The patient is alert and cooperative at the time of the examination.  Eyes: Pupils are equal, round, and reactive  to light. Discs are flat bilaterally.  Neck: The neck is supple, no carotid bruits are noted.  Respiratory: The respiratory examination is clear.  Cardiovascular: The cardiovascular examination reveals a regular rate and rhythm, no obvious murmurs or rubs are noted.  Skin: Extremities are without significant edema.  Neurologic Exam  Mental status: The patient is alert and oriented x 3 at the time of the examination. The patient has apparent normal recent and remote memory, with an apparently normal attention span and concentration ability.  Cranial nerves: Facial symmetry is present. There is good sensation of the face to pinprick and soft touch bilaterally. The strength of the facial muscles and the muscles to head turning and shoulder shrug are normal bilaterally. Speech is well enunciated, no aphasia or dysarthria is noted. Extraocular movements are full. Visual fields are notable for a dense right homonymous visual field deficit. The tongue is midline, and the patient has symmetric elevation of the soft palate. No obvious hearing deficits are noted.  The patient is not able to read a sentence.  Motor: The motor testing reveals 5 over 5 strength of all 4 extremities. Good symmetric motor tone is noted throughout.  Sensory: Sensory testing is intact to pinprick, soft touch, vibration sensation, and position sense on all 4 extremities, with exception of decreased position sense in the right foot. No evidence of extinction is noted.  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  Gait and station: Gait is normal. Tandem gait is slightly unsteady. Romberg is negative. No drift is seen.  Reflexes: Deep tendon reflexes are symmetric and normal bilaterally in the lower extremities, decreased right biceps reflex as compared to the left is seen. Toes are downgoing bilaterally.   MRI brain 03/16/20:  IMPRESSION: 1. Acute  infarction in the left PCA territory affecting the left posteromedial temporal lobe and occipital lobe. Minimal petechial blood products. No frank hematoma. 2. 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 3. Question abnormal flow in the left vertebral artery.  * MRI scan images were reviewed online. I agree with the written report.   MRV head 03/16/20:  IMPRESSION: 1. Normal intracranial MR venography. 2. Evidence of subacute infarction in the left lateral occipital lobe, with cortical enhancement. No enhancement seen in the region of acute infarction. See results of previous MRI.    CTA head and neck 03/16/20:  IMPRESSION: 1. Aortic atherosclerosis. 2. Atherosclerotic disease at both carotid bifurcations. No measurable stenosis on the right. 60% stenosis of the distal bulb on the left. 3. 30% stenosis of both vertebral artery origins. 50% stenosis of the right V4 segment. Severe stenosis of the left vertebral artery just beyond PICA, but with some antegrade flow. 4. 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 Stenoses in the right posterior cerebral artery, moderate at the P2 P3 junction.  2D echo 03/16/20:  IMPRESSIONS    1. Left ventricular ejection fraction, by estimation, is 55 to 60%. The  left ventricle has normal function. The left ventricle has no regional  wall motion abnormalities. Left ventricular diastolic parameters are  consistent with Grade I diastolic  dysfunction (impaired relaxation).  2. Right ventricular systolic function is normal. The right ventricular  size is normal.  3. The mitral valve is abnormal. No evidence of mitral valve  regurgitation. The mean mitral valve gradient is 3.0 mmHg with average  heart rate of 68 bpm. Moderate mitral annular calcification.  4. The aortic valve is tricuspid. There is mild calcification of the  aortic valve. Aortic valve regurgitation is not visualized.  5. The  inferior vena cava is normal in size with greater than 50%  respiratory variability, suggesting right atrial pressure of 3 mmHg.  6. Agitated saline contrast bubble study was negative, with no evidence  of any interatrial shunt.  5. 30% stenosis of the distal right M1 segment. 50% stenosis of the left M1 segment.   Assessment/Plan:  1.  Left posterior cerebral artery distribution stroke  2.  Right homonymous visual field deficit  3.  Language deficit following stroke, alexia  The patient has been placed on a cholesterol-lowering agent.  This will need to be followed over time.  He is to keep his systolic blood pressures under 130, they check this at home.  He will follow up here in about 3 months.  He will convert to Plavix.  He is not to operate a motor vehicle, the homonymous visual field deficit is likely not to improve over time.  Hopefully his language deficits will gradually improve.  Currently, he has a lot of difficulty following verbal commands.  Marlan Palau MD 04/01/2020 9:35 AM  [facility] [address] [place], [place] [address]  Phone [phone] Fax [phone]

## 2020-04-02 ENCOUNTER — Other Ambulatory Visit (HOSPITAL_COMMUNITY): Payer: Self-pay | Admitting: Interventional Radiology

## 2020-04-02 DIAGNOSIS — I771 Stricture of artery: Secondary | ICD-10-CM

## 2020-04-10 ENCOUNTER — Telehealth: Payer: Self-pay

## 2020-04-10 NOTE — Telephone Encounter (Signed)
FAXED NOTES TO [facility] [phone] 

## 2020-04-17 ENCOUNTER — Other Ambulatory Visit: Payer: Self-pay | Admitting: Student

## 2020-04-18 ENCOUNTER — Other Ambulatory Visit: Payer: Self-pay

## 2020-04-18 ENCOUNTER — Encounter (HOSPITAL_COMMUNITY): Payer: Self-pay

## 2020-04-18 ENCOUNTER — Ambulatory Visit (HOSPITAL_COMMUNITY)
Admission: RE | Admit: 2020-04-18 | Discharge: 2020-04-18 | Disposition: A | Discharge status: Home / Self Care | Payer: Medicare HMO | Source: Ambulatory Visit | Attending: Interventional Radiology | Admitting: Interventional Radiology

## 2020-04-18 DIAGNOSIS — Z01818 Encounter for other preprocedural examination: Secondary | ICD-10-CM | POA: Insufficient documentation

## 2020-04-18 DIAGNOSIS — Z538 Procedure and treatment not carried out for other reasons: Secondary | ICD-10-CM | POA: Diagnosis not present

## 2020-04-18 DIAGNOSIS — I771 Stricture of artery: Secondary | ICD-10-CM

## 2020-04-18 LAB — CBC WITH DIFFERENTIAL/PLATELET
Abs Immature Granulocytes: 0.02 10*3/uL (ref 0.00–0.07)
Basophils Absolute: 0.1 10*3/uL (ref 0.0–0.1)
Basophils Relative: 1 %
Eosinophils Absolute: 0.2 10*3/uL (ref 0.0–0.5)
Eosinophils Relative: 2 %
HCT: 45.6 % (ref 39.0–52.0)
Hemoglobin: 14.6 g/dL (ref 13.0–17.0)
Immature Granulocytes: 0 %
Lymphocytes Relative: 19 %
Lymphs Abs: 1.6 10*3/uL (ref 0.7–4.0)
MCH: 31.7 pg (ref 26.0–34.0)
MCHC: 32 g/dL (ref 30.0–36.0)
MCV: 98.9 fL (ref 80.0–100.0)
Monocytes Absolute: 0.4 10*3/uL (ref 0.1–1.0)
Monocytes Relative: 5 %
Neutro Abs: 5.9 10*3/uL (ref 1.7–7.7)
Neutrophils Relative %: 73 %
Platelets: 300 10*3/uL (ref 150–400)
RBC: 4.61 MIL/uL (ref 4.22–5.81)
RDW: 13.2 % (ref 11.5–15.5)
WBC: 8.2 10*3/uL (ref 4.0–10.5)
nRBC: 0 % (ref 0.0–0.2)

## 2020-04-18 MED ORDER — SODIUM CHLORIDE 0.9 % IV SOLN: INTRAVENOUS | Status: DC

## 2020-04-18 NOTE — Progress Notes (Signed)
Interventional Radiology Brief Note:  Patient planning for diagnostic angiogram today, however will need to schedule due to conflict with emergent Code Stroke.   Patient and daughter informed.    Intend to reschedule as soon as possible.  Schedulers aware.   Labs were drawn today.  Monitor for results and if WNL, then no need to redrawn at next appt.   Loyce Dys, MS RD PA-C 10:19 AM

## 2020-04-24 ENCOUNTER — Ambulatory Visit: Payer: Medicare HMO | Admitting: Cardiology

## 2020-05-01 ENCOUNTER — Other Ambulatory Visit: Payer: Self-pay | Admitting: Radiology

## 2020-05-01 ENCOUNTER — Other Ambulatory Visit (HOSPITAL_COMMUNITY): Payer: Self-pay | Admitting: Physician Assistant

## 2020-05-02 ENCOUNTER — Other Ambulatory Visit (HOSPITAL_COMMUNITY): Payer: Self-pay | Admitting: Interventional Radiology

## 2020-05-02 ENCOUNTER — Ambulatory Visit (HOSPITAL_COMMUNITY)
Admission: RE | Admit: 2020-05-02 | Discharge: 2020-05-02 | Disposition: A | Discharge status: Home / Self Care | Payer: Medicare HMO | Source: Ambulatory Visit | Attending: Interventional Radiology | Admitting: Interventional Radiology

## 2020-05-02 ENCOUNTER — Other Ambulatory Visit: Payer: Self-pay

## 2020-05-02 DIAGNOSIS — Z7902 Long term (current) use of antithrombotics/antiplatelets: Secondary | ICD-10-CM | POA: Insufficient documentation

## 2020-05-02 DIAGNOSIS — I6522 Occlusion and stenosis of left carotid artery: Secondary | ICD-10-CM | POA: Insufficient documentation

## 2020-05-02 DIAGNOSIS — I10 Essential (primary) hypertension: Secondary | ICD-10-CM | POA: Insufficient documentation

## 2020-05-02 DIAGNOSIS — I771 Stricture of artery: Secondary | ICD-10-CM

## 2020-05-02 DIAGNOSIS — Z79899 Other long term (current) drug therapy: Secondary | ICD-10-CM | POA: Insufficient documentation

## 2020-05-02 DIAGNOSIS — E785 Hyperlipidemia, unspecified: Secondary | ICD-10-CM | POA: Diagnosis not present

## 2020-05-02 DIAGNOSIS — Z87891 Personal history of nicotine dependence: Secondary | ICD-10-CM | POA: Diagnosis not present

## 2020-05-02 DIAGNOSIS — I2510 Atherosclerotic heart disease of native coronary artery without angina pectoris: Secondary | ICD-10-CM | POA: Diagnosis not present

## 2020-05-02 DIAGNOSIS — Z955 Presence of coronary angioplasty implant and graft: Secondary | ICD-10-CM | POA: Diagnosis not present

## 2020-05-02 DIAGNOSIS — Z881 Allergy status to other antibiotic agents status: Secondary | ICD-10-CM | POA: Diagnosis not present

## 2020-05-02 HISTORY — PX: IR US GUIDE VASC ACCESS RIGHT: IMG2390

## 2020-05-02 HISTORY — PX: IR ANGIO VERTEBRAL SEL VERTEBRAL UNI R MOD SED: IMG5368

## 2020-05-02 HISTORY — PX: IR ANGIO VERTEBRAL SEL SUBCLAVIAN INNOMINATE UNI L MOD SED: IMG5364

## 2020-05-02 HISTORY — PX: IR ANGIO INTRA EXTRACRAN SEL COM CAROTID INNOMINATE BILAT MOD SED: IMG5360

## 2020-05-02 LAB — CBC
HCT: 38.5 % — ABNORMAL LOW (ref 39.0–52.0)
Hemoglobin: 12.9 g/dL — ABNORMAL LOW (ref 13.0–17.0)
MCH: 32 pg (ref 26.0–34.0)
MCHC: 33.5 g/dL (ref 30.0–36.0)
MCV: 95.5 fL (ref 80.0–100.0)
Platelets: 245 10*3/uL (ref 150–400)
RBC: 4.03 MIL/uL — ABNORMAL LOW (ref 4.22–5.81)
RDW: 12.9 % (ref 11.5–15.5)
WBC: 6 10*3/uL (ref 4.0–10.5)
nRBC: 0 % (ref 0.0–0.2)

## 2020-05-02 LAB — PROTIME-INR
INR: 1 (ref 0.8–1.2)
Prothrombin Time: 12.7 seconds (ref 11.4–15.2)

## 2020-05-02 IMAGING — US IR ANGIO INTRA EXTRACRAN SEL COM CAROTID INNOMINATE BILAT MOD SE
2 of 3 series · 11 of 24 positions shown · non-contrast
Comparison: CT angiogram of the head and neck [DATE].

CLINICAL DATA: History of left cerebral hemispheric ischemic stroke
secondary to severe proximal left ICA stenosis, seen on CT angiogram
of the head and neck

EXAM:
BILATERAL COMMON CAROTID AND INNOMINATE ANGIOGRAPHY
TECHNIQUE: Informed written consent was obtained from the patient after a
thorough discussion of the procedural risks, benefits and
alternatives. All questions were addressed. Maximal Sterile Barrier
Technique was utilized including caps, mask, sterile gowns, sterile
gloves, sterile drape, hand hygiene and skin antiseptic. A timeout
was performed prior to the initiation of the procedure.

[Series 7: cerebral · 1 of 1 slices shown]
[im 1/1]
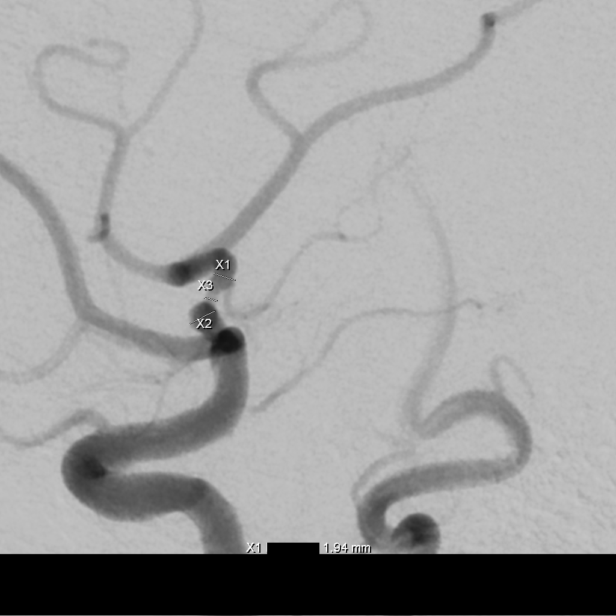

[Series 300: dr. (person_name) · 10 of 220 slices shown]
[im 11/220]
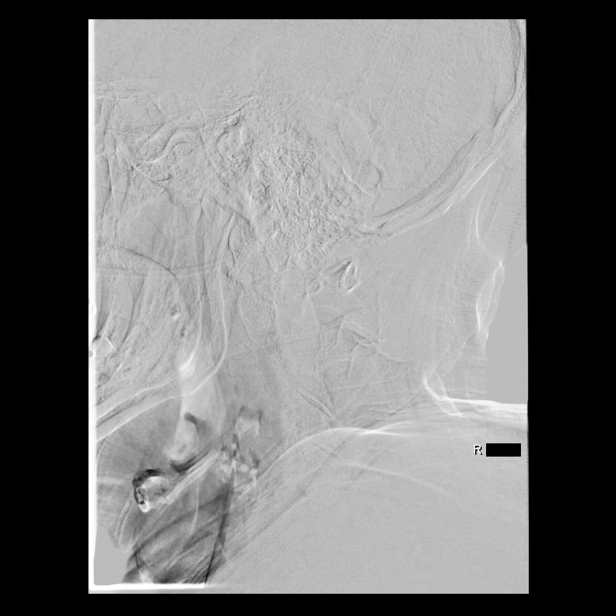
[im 32/220]
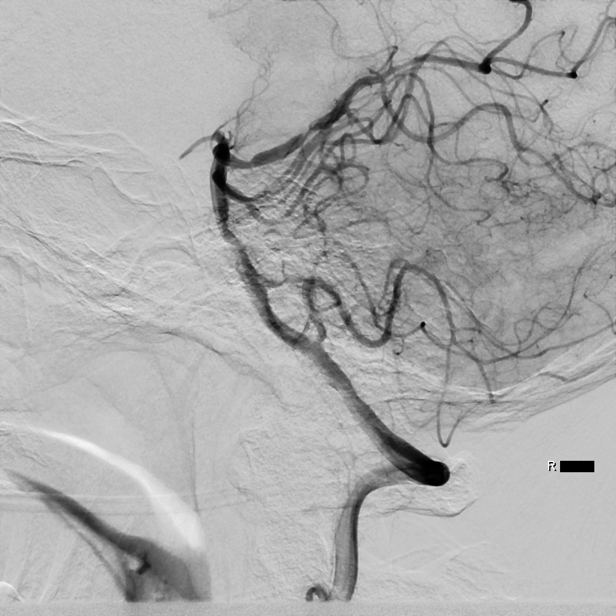
[im 53/220]
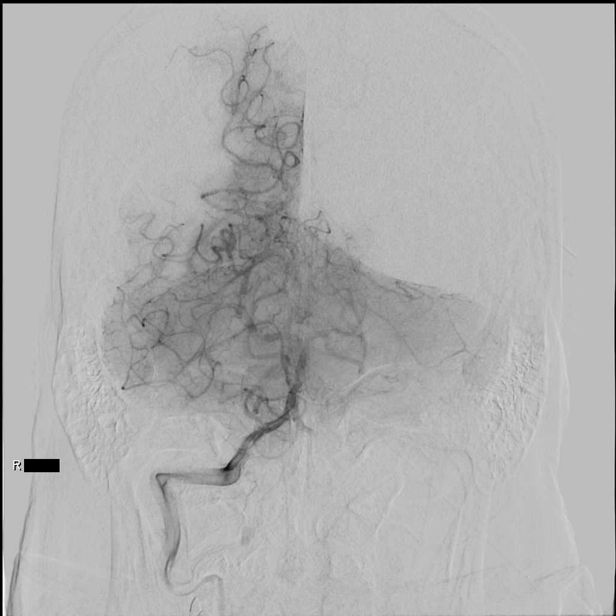
[im 74/220]
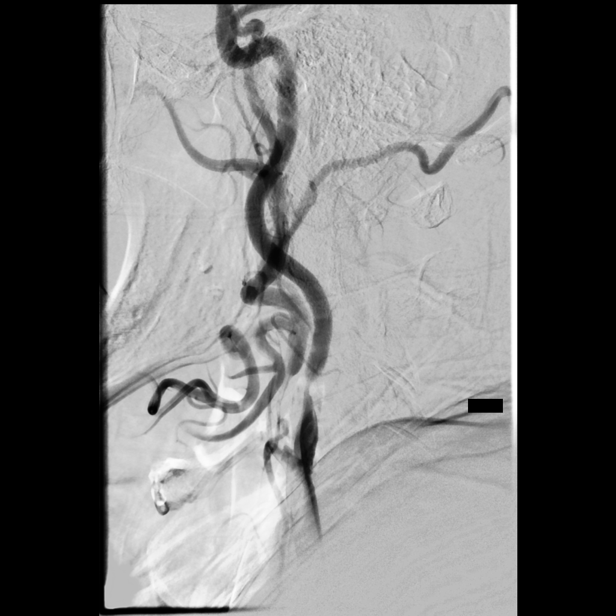
[im 105/220]
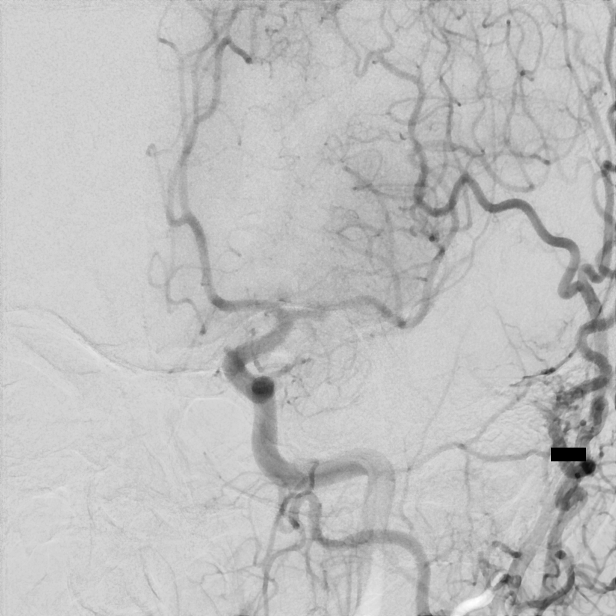
[im 126/220]
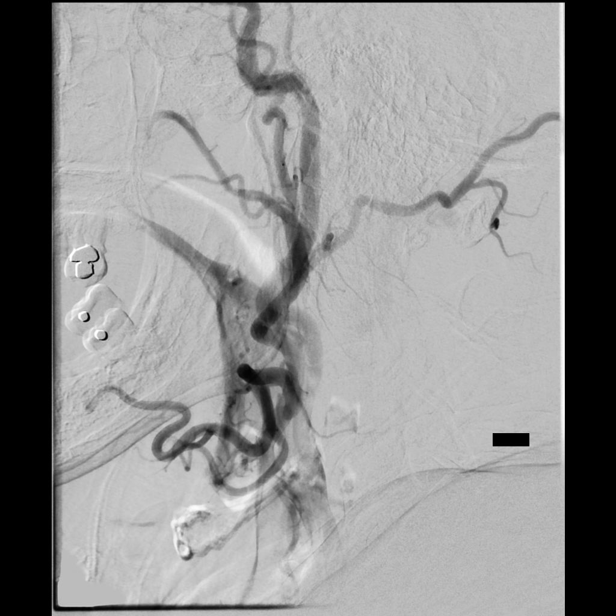
[im 147/220]
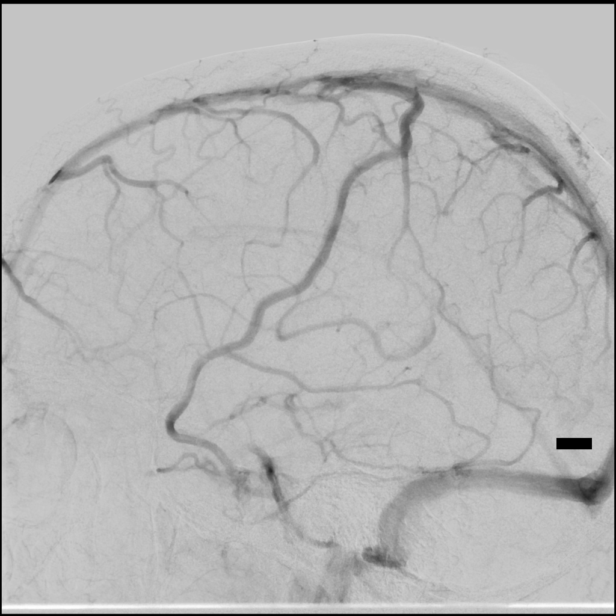
[im 167/220]
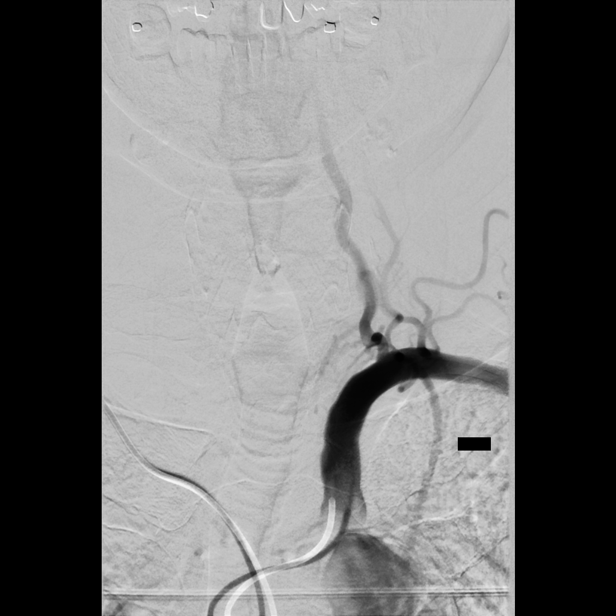
[im 188/220]
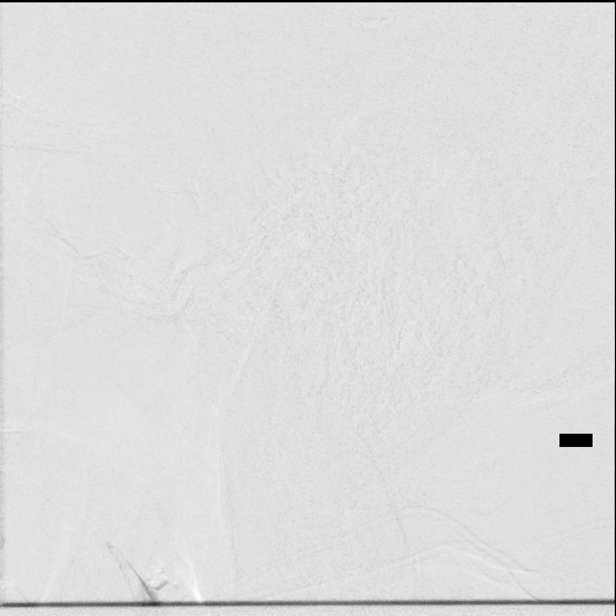
[im 209/220]
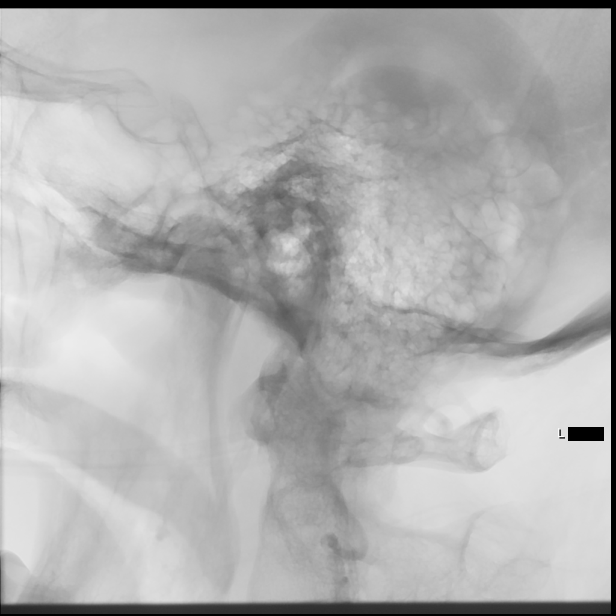

[11 of 24 positions shown; findings below may reference images not displayed]

MEDICATIONS:
Heparin [WI] units IA. No antibiotic was administered within 1 hour
of the procedure.

ANESTHESIA/SEDATION:
Versed 1 mg IV; Fentanyl 25 mcg IV

Moderate Sedation Time:  47 minutes

The patient was continuously monitored during the procedure by the
interventional radiology nurse under my direct supervision.

CONTRAST:  Isovue 300 approximately 65 mL

FLUOROSCOPY TIME:  Fluoroscopy Time: 13 minutes 12 seconds (966
mGy).

COMPLICATIONS:
None immediate.
The right forearm to the wrist was prepped and draped in the usual
sterile manner.

The right radial artery was identified with ultrasound, and its
morphology documented. A dorsal palmar anastomosis was verified to
be present. Using ultrasound guidance, right radial access was
obtained over an 018 inch micro guidewire, followed by advancement
of a [DATE] French radial sheath without event. The obturator, and the
wire were removed. Good aspiration obtained from the side port of
the radial sheath. A cocktail of [WI] units of heparin, and 200 mcg
of nitroglycerin was then infused through the sheath without event.

A right radial arteriogram was performed.

A 5 KUSHTTRIM 2 diagnostic catheter was then advanced over a
0.035 inch Roadrunner guidewire to the aortic arch, and selectively
positioned in the right vertebral artery, the right common carotid
artery, the left common carotid artery and the left subclavian
artery.

At the end of the procedure, hemostasis at the right radial puncture
site was achieved with a wrist band. A right radial pulse was
verified to be present.
FINDINGS: The innominate right vertebral artery demonstrates patency its
origin. The vessel is seen to opacify to the cranial skull base.
Patency is seen of the right vertebrobasilar junction and the right
posterior-inferior cerebellar artery.

The basilar artery demonstrates approximately 50% stenosis just
distal to the origins of the anterior-inferior cerebellar arteries.

There is occlusion of the left posterior cerebral artery and distal
P1 segment. The right posterior cerebral artery demonstrates 50%
stenosis in the right P1 segment, and a 70-80% stenosis at the P2 P3
junction.

Retrograde opacification of the left posterior-inferior cerebellar
artery is seen from the right vertebral artery injection.

The left common carotid arteriogram demonstrates the left external
carotid artery to be mildly narrowed in its proximal aspect. Its
branches, however, opacify normally.

The left internal carotid artery just distal to the bulb has a
severe tapered 85% stenosis. Distal to this the vessel opacifies
widely to the cranial skull base.

The petrous, cavernous and supraclinoid segments are widely patent.

The left middle cerebral artery demonstrates approximately 50%
stenosis in the mid M1 segment. The trifurcation branches opacify
into the capillary and venous phases.

The left anterior cerebral artery demonstrates patency into the
capillary and venous phases.

No evidence of left posterior communicating artery is seen.

The right common carotid arteriogram demonstrates the right external
carotid artery and its major branches to be widely patent.

The right internal carotid artery just distal to the bulb has a mild
narrowing without evidence of intraluminal filling defects or
ulcerations.

More distally the vessel is seen to opacify normally to the cranial
skull base. The petrous, cavernous and the supraclinoid segments are
widely patent.

The right middle cerebral artery and the right anterior cerebral
artery opacify into the capillary and venous phases.

Left vertebral artery origin is widely patent.

The vessel demonstrates mild tortuosity proximally.

There is slow ascent of contrast to the skull base with
opacification of the left posterior-inferior cerebellar artery.

Complete occlusion of the left vertebrobasilar junction is noted
distal to the origin of the left posterior-inferior cerebellar
artery.

There is approximately 50% stenosis just proximal to the origin of
the left posterior-inferior cerebellar artery.
IMPRESSION: Severe approximately 85% plus stenosis of the proximal left internal
carotid artery secondary to a circumferential atherosclerotic
plaque.

Approximately 50% stenosis of the left middle cerebral artery mid M1
segment.

Angiographically occluded left posterior cerebral artery P1 segment.

Approximately 50% stenosis of the right posterior cerebral artery P1
segment, and 75% stenosis at the P2 P3 junction.

Occluded left vertebrobasilar junction just distal to the origin of
the left posterior-inferior cerebellar artery.

PLAN:
Findings reviewed with patient and the daughter. Given the
symptomatic nature of the left ICA stenosis, endovascular
revascularization to prevent further ischemic events to the left
anterior circulation was reviewed with the daughter and the patient.

The procedure, the reason, alternatives were reviewed.

Patient, and daughter would like to proceed with endovascular
revascularization of the left internal carotid artery proximally.
This will be scheduled as soon as possible.

In the meantime, the patient was advised to take aspirin 325 mg a
day in addition to the 75 mg of Plavix per day. A platelet function
test will be performed at the time of anesthesia consultation.

Patient was advised to maintain adequate hydration. They both leave
with good understanding and agreement with the above management
plan.

## 2020-05-02 MED ORDER — FENTANYL CITRATE (PF) 100 MCG/2ML IJ SOLN
INTRAMUSCULAR | Status: AC
  Filled 2020-05-02: qty 2

## 2020-05-02 MED ORDER — FENTANYL CITRATE (PF) 100 MCG/2ML IJ SOLN
INTRAMUSCULAR | Status: AC | PRN
  Administered 2020-05-02: 25 ug via INTRAVENOUS

## 2020-05-02 MED ORDER — LIDOCAINE HCL 1 % IJ SOLN
INTRAMUSCULAR | Status: AC
  Filled 2020-05-02: qty 20

## 2020-05-02 MED ORDER — NITROGLYCERIN 1 MG/10 ML FOR IR/CATH LAB: INTRA_ARTERIAL | Status: AC | PRN

## 2020-05-02 MED ORDER — NITROGLYCERIN 1 MG/10 ML FOR IR/CATH LAB
INTRA_ARTERIAL | Status: AC
  Filled 2020-05-02: qty 10

## 2020-05-02 MED ORDER — IOHEXOL 300 MG/ML  SOLN
50.0000 mL | Freq: Once | INTRAMUSCULAR | Status: AC | PRN
  Administered 2020-05-02: 15 mL via INTRA_ARTERIAL

## 2020-05-02 MED ORDER — SODIUM CHLORIDE 0.9 % IV SOLN: INTRAVENOUS | Status: DC

## 2020-05-02 MED ORDER — MIDAZOLAM HCL 2 MG/2ML IJ SOLN
INTRAMUSCULAR | Status: AC
  Filled 2020-05-02: qty 2

## 2020-05-02 MED ORDER — IOHEXOL 300 MG/ML  SOLN
150.0000 mL | Freq: Once | INTRAMUSCULAR | Status: AC | PRN
  Administered 2020-05-02: 60 mL via INTRA_ARTERIAL

## 2020-05-02 MED ORDER — MIDAZOLAM HCL 2 MG/2ML IJ SOLN
INTRAMUSCULAR | Status: AC | PRN
  Administered 2020-05-02: 1 mg via INTRAVENOUS

## 2020-05-02 MED ORDER — HEPARIN SODIUM (PORCINE) 1000 UNIT/ML IJ SOLN
INTRAMUSCULAR | Status: AC
  Filled 2020-05-02: qty 1

## 2020-05-02 MED ORDER — SODIUM CHLORIDE (PF) 0.9 % IJ SOLN
INTRAVENOUS | Status: AC | PRN
  Administered 2020-05-02: 200 ug via INTRA_ARTERIAL

## 2020-05-02 MED ORDER — VERAPAMIL HCL 2.5 MG/ML IV SOLN
INTRAVENOUS | Status: AC
  Filled 2020-05-02: qty 2

## 2020-05-02 MED ORDER — SODIUM CHLORIDE 0.9 % IV SOLN: INTRAVENOUS | Status: AC

## 2020-05-02 MED ORDER — LIDOCAINE HCL 1 % IJ SOLN
INTRAMUSCULAR | Status: AC | PRN
  Administered 2020-05-02: 10 mL

## 2020-05-02 MED ORDER — SODIUM CHLORIDE 0.9 % IV BOLUS: 500.0000 mL | Freq: Once | INTRAVENOUS | Status: DC

## 2020-05-02 NOTE — Sedation Documentation (Signed)
[facility] called to [place], Charity fundraiser. All questions answered to satisfaction.  Pt  transported to [facility] [place] [facility] on stretcher by me.

## 2020-05-02 NOTE — Progress Notes (Signed)
Discharge instructions reviewed with pt and his daughter both voice understanding.  

## 2020-05-02 NOTE — Sedation Documentation (Signed)
500cc NS bolus started per Dr. Corliss Skains for BP 95/57 (71). Pt resting comfortable, asymptomatic.

## 2020-05-02 NOTE — H&P (Signed)
Chief Complaint: Patient was seen in consultation today for diagnostic cerebral angiogram with possible intervention.  Referring Physician(s): Stephanie Acre, MD (neurology)  Supervising Physician: Julieanne Cotton  Patient Status: [name] - Out-pt  History of Present Illness: [name] is a 70 y.o. male with a past medical history significant for CAD s/p stenting, HTN, HLD, DM and right-sided homonymous hemianopsia who presents today for a diagnostic cerebral angiogram. [name] presented to the ED on 03/15/20 with headache, slurred speech and difficulty with word finding. 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 Follow up CTA showed 60% stenosis of the distal bulb on the left, 30% stenosis of both vertebral artery origins, 50% stenosis of the right V4 segment, severe stenosis of the left vertebral artery just beyond the PICA, left PCA occlusion immediately beyond the origin, 30% stenosis of the distal right M1 segment and 50% stenosis of the left M1 segment. He was admitted and his symptoms improved so he was discharged with close outpatient neurology follow up. He was seen by Dr. Anne Hahn on 3/14 and was then referred to [facility]. He was seen in consultation by Dr. Corliss Skains on 03/27/20 where he reported absent vision in the right visual field since the stroke as well as intermittent word finding difficulty and short term memory loss. After thorough discussion the decision was made to proceed with a diagnostic cerebral angiogram to further assess imaging findings and need for possible treatment in the future.  [name] continues to have right vision loss in the outer ~50% of his visual field but otherwise denies any complaints. He has been taking Plavix as prescribed without issue. He states he's had this test before and didn't like it because it was painful so he is not looking forward to today but is willing to proceed.   Past Medical History:  Diagnosis Date  .  Hypertension   . Stroke [facility]) 03/15/2020    Past Surgical History:  Procedure Laterality Date  . IR RADIOLOGIST EVAL & MGMT  03/27/2020    Allergies: Ciprodex [ciprofloxacin-dexamethasone] and Metformin and related  Medications: Prior to Admission medications   Medication Sig Start Date End Date Taking? Authorizing Provider  acetaminophen (TYLENOL) 500 MG tablet Take 1,000 mg by mouth every 6 (six) hours as needed for moderate pain or headache.   Yes [provider]  atorvastatin (LIPITOR) 80 MG tablet Take 1 tablet (80 mg total) by mouth daily. 03/17/20  Yes Azucena Fallen, MD  b complex vitamins capsule Take 1 capsule by mouth daily.   Yes [provider]  clopidogrel (PLAVIX) 75 MG tablet Take 1 tablet (75 mg total) by mouth daily. 03/17/20 06/15/20 Yes Pahwani, Daleen Bo, MD  hydrochlorothiazide (HYDRODIURIL) 25 MG tablet Take 1 tablet (25 mg total) by mouth daily. 03/18/20  Yes Pahwani, Daleen Bo, MD  lisinopril (ZESTRIL) 40 MG tablet Take 40 mg by mouth daily.   Yes [provider]  potassium chloride SA (KLOR-CON) 20 MEQ tablet Take 1 tablet (20 mEq total) by mouth daily. 03/22/20  Yes Azucena Fallen, MD  VITAMIN D-VITAMIN K PO Take 1 capsule by mouth daily.   Yes [provider]  lisinopril (ZESTRIL) 20 MG tablet Take 1 tablet (20 mg total) by mouth daily. Patient not taking: No sig reported 03/18/20   Hughie Closs, MD  metFORMIN (GLUCOPHAGE) 500 MG tablet Take 1 tablet (500 mg total) by mouth 2 (two) times daily with a meal. Patient not taking: No sig reported 03/16/20 09/12/20  Azucena Fallen, MD     Family History  Problem Relation Age of Onset  . Heart attack Father   . Stroke Paternal Great-grandfather     Social History   Socioeconomic History  . Marital status: Single    Spouse name: Not on file  . Number of children: Not on file  . Years of education: Not on file  . Highest education level: Not on file  Occupational History   . Not on file  Tobacco Use  . Smoking status: Former Games developer  . Smokeless tobacco: Never Used  Substance and Sexual Activity  . Alcohol use: Yes    Comment: occasionally   . Drug use: No  . Sexual activity: Not on file  Other Topics Concern  . Not on file  Social History Narrative   Lives with daughter (temporary after stroke)   Right handed   Drinks 1-2 cups caffeine daily   Social Determinants of Health   Financial Resource Strain: Not on file  Food Insecurity: Not on file  Transportation Needs: Not on file  Physical Activity: Not on file  Stress: Not on file  Social Connections: Not on file     Review of Torres: A 12 point ROS discussed and pertinent positives are indicated in the HPI above.  All other Torres are negative.  Review of Torres  Constitutional: Negative for chills and fever.  Eyes:       (+) right visual field sight loss since stroke  Respiratory: Negative for cough and shortness of breath.   Cardiovascular: Negative for chest pain.  Gastrointestinal: Negative for abdominal pain and vomiting.  Genitourinary: Negative for dysuria and hematuria.  Musculoskeletal: Negative for back pain.  Neurological: Negative for dizziness, seizures, weakness, numbness and headaches.    Vital Signs: BP 125/78   Pulse 69   Temp 98 F (36.7 C) (Oral)   Resp 20   Ht 5\' 10"  (1.778 m)   Wt 198 lb 6.6 oz (90 kg)   SpO2 97%   BMI 28.47 kg/m   Physical Exam Vitals reviewed.  Constitutional:      General: He is not in acute distress. HENT:     Head: Normocephalic.     Mouth/Throat:     Mouth: Mucous membranes are moist.     Pharynx: Oropharynx is clear. No oropharyngeal exudate or posterior oropharyngeal erythema.  Cardiovascular:     Rate and Rhythm: Normal rate and regular rhythm.  Pulmonary:     Effort: Pulmonary effort is normal.     Breath sounds: Normal breath sounds.  Abdominal:     Palpations: Abdomen is soft.  Skin:    General: Skin is warm and  dry.  Neurological:     Mental Status: He is alert and oriented to person, place, and time.  Psychiatric:        Mood and Affect: Mood normal.        Behavior: Behavior normal.        Thought Content: Thought content normal.        Judgment: Judgment normal.      MD Evaluation Airway: WNL Heart: WNL Abdomen: WNL Chest/ Lungs: WNL ASA  Classification: 3 Mallampati/Airway Score: Two   Imaging: No results found.  Labs:  CBC: Recent Labs    03/15/20 1902 03/15/20 1913 04/18/20 0932  WBC 8.8  --  8.2  HGB 17.9* 18.0* 14.6  HCT 52.4* 53.0* 45.6  PLT 297  --  300    COAGS: Recent Labs  03/15/20 1902  INR 0.9  APTT 33    BMP: Recent Labs    03/15/20 1902 03/15/20 1913  NA 135 137  K 3.8 4.0  CL 94* 96*  CO2 29  --   GLUCOSE 137* 141*  BUN 14 14  CALCIUM 10.9*  --   CREATININE 0.92 0.90  GFRNONAA >60  --     LIVER FUNCTION TESTS: Recent Labs    03/15/20 1902  BILITOT 1.4*  AST 24  ALT 25  ALKPHOS 68  PROT 8.8*  ALBUMIN 5.0    TUMOR MARKERS: No results for input(s): AFPTM, CEA, CA199, CHROMGRNA in the last 8760 hours.  Assessment and Plan:  70 y/o M who presented to [facility] ED on [facility]/15/20 as a code stroke and found to have high-grade stenosis of the left PICA and occlusion of the left PCA with residual right-sided vision loss, word finding difficulties and short term memory loss who presents today for a diagnostic cerebral angiogram to further assess these imaging findings and direct future care.  Patient has been NPO since midnight, he is currently taking Plavix 75 mg QD with last dose last night. Afebrile, WBC 6.0, hgb 12.9, plt 245, INR 1.0.  Risks and benefits of diagnostic cerebral angiogram were discussed with the patient including, but not limited to bleeding, infection, vascular injury, stroke, or contrast induced renal failure.  This interventional procedure involves the use of X-rays and because of the nature of the planned procedure,  it is possible that we will have prolonged use of X-ray fluoroscopy.  Potential radiation risks to you include (but are not limited to) the following: - A slightly elevated risk for cancer  several years later in life. This risk is typically less than 0.5% percent. This risk is low in comparison to the normal incidence of human cancer, which is 33% for women and 50% for men according to the [employer] Cancer Society. - Radiation induced injury can include skin redness, resembling a rash, tissue breakdown / ulcers and hair loss (which can be temporary or permanent).  The likelihood of either of these occurring depends on the difficulty of the procedure and whether you are sensitive to radiation due to previous procedures, disease, or genetic conditions.  IF your procedure requires a prolonged use of radiation, you will be notified and given written instructions for further action.  It is your responsibility to monitor the irradiated area for the 2 weeks following the procedure and to notify your physician if you are concerned that you have suffered a radiation induced injury.    All of the patient's questions were answered, patient is agreeable to proceed.  Consent signed and in chart.  Thank you for this interesting consult.  I greatly enjoyed meeting KUBA SHEPHERD and look forward to participating in their care.  A copy of this report was sent to the requesting provider on this date.  Electronically Signed: Villa Herb, PA-C 05/02/2020, 7:37 AM   I spent a total of  30 Minutes  in face to face in clinical consultation, greater than 50% of which was counseling/coordinating care for diagnostic cerebral angiogram.

## 2020-05-02 NOTE — Discharge Instructions (Signed)
Radial Site Care  This sheet gives you information about how to care for yourself after your procedure. Your health care provider may also give you more specific instructions. If you have problems or questions, contact your health care provider. What can I expect after the procedure? After the procedure, it is common to have:  Bruising and tenderness at the catheter insertion area. Follow these instructions at home: Medicines  Take over-the-counter and prescription medicines only as told by your health care provider. Insertion site care 1. Follow instructions from your health care provider about how to take care of your insertion site. Make sure you: ? Wash your hands with soap and water before you remove your bandage (dressing). If soap and water are not available, use hand sanitizer. ? May remove dressing in 24 hours. 2. Check your insertion site every day for signs of infection. Check for: ? Redness, swelling, or pain. ? Fluid or blood. ? Pus or a bad smell. ? Warmth. 3. Do no take baths, swim, or use a hot tub for 5 days. 4. You may shower 24-48 hours after the procedure. ? Remove the dressing and gently wash the site with plain soap and water. ? Pat the area dry with a clean towel. ? Do not rub the site. That could cause bleeding. 5. Do not apply powder or lotion to the site. Activity  1. For 24 hours after the procedure, or as directed by your health care provider: ? Do not flex or bend the affected arm. ? Do not push or pull heavy objects with the affected arm. ? Do not drive yourself home from the hospital or clinic. You may drive 24 hours after the procedure. ? Do not operate machinery or power tools. ? KEEP ARM ELEVATED THE REMAINDER OF THE DAY. 2. Do not push, pull or lift anything that is heavier than 10 lb for 5 days. 3. Ask your health care provider when it is okay to: ? Return to work or school. ? Resume usual physical activities or sports. ? Resume sexual  activity. General instructions  If the catheter site starts to bleed, raise your arm and put firm pressure on the site. If the bleeding does not stop, get help right away. This is a medical emergency.  DRINK PLENTY OF FLUIDS FOR THE NEXT 2-3 DAYS.  No alcohol consumption for 24 hours after receiving sedation.  If you went home on the same day as your procedure, a responsible adult should be with you for the first 24 hours after you arrive home.  Keep all follow-up visits as told by your health care provider. This is important. Contact a health care provider if:  You have a fever.  You have redness, swelling, or yellow drainage around your insertion site. Get help right away if:  You have unusual pain at the radial site.  The catheter insertion area swells very fast.  The insertion area is bleeding, and the bleeding does not stop when you hold steady pressure on the area.  Your arm or hand becomes pale, cool, tingly, or numb. These symptoms may represent a serious problem that is an emergency. Do not wait to see if the symptoms will go away. Get medical help right away. Call your local emergency services (911 in the [place] Do not drive yourself to the hospital. Summary  After the procedure, it is common to have bruising and tenderness at the site.  Follow instructions from your health care provider about how to take care   of your radial site wound. Check the wound every day for signs of infection.  This information is not intended to replace advice given to you by your health care provider. Make sure you discuss any questions you have with your health care provider. Document Revised: 02/10/2017 Document Reviewed: 02/10/2017 Elsevier Patient Education  2020 [employer] 

## 2020-05-02 NOTE — Sedation Documentation (Signed)
NS bolus complete.

## 2020-05-02 NOTE — Procedures (Signed)
S/P  4 vessel cerebral arteriogram RT rad approach. Findings. 1.Approx 85 % stenosis Lt ICA prox. 2.Approx 75 %stenosis RT PCA P2 vseg,and 50 % P1 seg. 3.Occluded PL PCA 1. 4.Approx 40% stenosis Lt MCA M 1 seg.  5.Occluded Lt VBJ  Distal to Lt PICA with 50 % stenosis prox to it. S.Zakkery Dorian.MD

## 2020-05-02 NOTE — Sedation Documentation (Signed)
Daughter at bedside waiting for Dr. Corliss Skains to review images and discuss plan of care.

## 2020-05-07 ENCOUNTER — Other Ambulatory Visit (HOSPITAL_COMMUNITY): Payer: Self-pay | Admitting: Interventional Radiology

## 2020-05-07 DIAGNOSIS — I6522 Occlusion and stenosis of left carotid artery: Secondary | ICD-10-CM

## 2020-05-15 NOTE — Progress Notes (Signed)
Surgical Instructions    Your procedure is scheduled on 05/20/20.  Report to [facility] Main Entrance "A" at 06:30 A.M., then check in with the Admitting office.  Call this number if you have problems the morning of surgery:  [phone]   If you have any questions prior to your surgery date call [phone]: Open Monday-Friday 8am-4pm    Remember:  Do not eat or drink after midnight the night before your surgery     Take these medicines the morning of surgery with A SIP OF WATER  acetaminophen (TYLENOL) if needed for pain or headache aspirin 325  atorvastatin (LIPITOR) clopidogrel (PLAVIX)    As of today, STOP taking  (unless otherwise instructed by your surgeon) Aleve, Naproxen, Ibuprofen, Motrin, Advil, Goody's, BC's, all herbal medications, fish oil, and all vitamins.                     Do not wear jewelry, make up, or nail polish            Do not wear lotions, powders, perfumes/colognes, or deodorant.            Men may shave face and neck.            Do not bring valuables to the hospital.            [facility] is not responsible for any belongings or valuables.  Do NOT Smoke (Tobacco/Vaping) or drink Alcohol 24 hours prior to your procedure If you use a CPAP at night, you may bring all equipment for your overnight stay.   Contacts, glasses, dentures or bridgework may not be worn into surgery, please bring cases for these belongings   For patients admitted to the hospital, discharge time will be determined by your treatment team.   Patients discharged the day of surgery will not be allowed to drive home, and someone needs to stay with them for 24 hours.    Special instructions:   [place]- Preparing For Surgery  Before surgery, you can play an important role. Because skin is not sterile, your skin needs to be as free of germs as possible. You can reduce the number of germs on your skin by washing with CHG (chlorahexidine gluconate) Soap before surgery.  CHG is  an antiseptic cleaner which kills germs and bonds with the skin to continue killing germs even after washing.    Oral Hygiene is also important to reduce your risk of infection.  Remember - BRUSH YOUR TEETH THE MORNING OF SURGERY WITH YOUR REGULAR TOOTHPASTE  Please do not use if you have an allergy to CHG or antibacterial soaps. If your skin becomes reddened/irritated stop using the CHG.  Do not shave (including legs and underarms) for at least 48 hours prior to first CHG shower. It is OK to shave your face.  Please follow these instructions carefully.   1. Shower the NIGHT BEFORE SURGERY and the MORNING OF SURGERY  2. If you chose to wash your hair, wash your hair first as usual with your normal shampoo.  3. After you shampoo, rinse your hair and body thoroughly to remove the shampoo.  4. Wash Face and genitals (private parts) with your normal soap.   5.  Shower the NIGHT BEFORE SURGERY and the MORNING OF SURGERY with CHG Soap.   6. Use CHG Soap as you would any other liquid soap. You can apply CHG directly to the skin and wash gently with a scrungie or a  clean washcloth.   7. Apply the CHG Soap to your body ONLY FROM THE NECK DOWN.  Do not use on open wounds or open sores. Avoid contact with your eyes, ears, mouth and genitals (private parts). Wash Face and genitals (private parts)  with your normal soap.   8. Wash thoroughly, paying special attention to the area where your surgery will be performed.  9. Thoroughly rinse your body with warm water from the neck down.  10. DO NOT shower/wash with your normal soap after using and rinsing off the CHG Soap.  11. Pat yourself dry with a CLEAN TOWEL.  12. Wear CLEAN PAJAMAS to bed the night before surgery  13. Place CLEAN SHEETS on your bed the night before your surgery  14. DO NOT SLEEP WITH PETS.   Day of Surgery: Take a shower with CHG soap. Wear Clean/Comfortable clothing the morning of surgery Do not apply any  deodorants/lotions.   Remember to brush your teeth WITH YOUR REGULAR TOOTHPASTE.   Please read over the following fact sheets that you were given.

## 2020-05-16 ENCOUNTER — Encounter (HOSPITAL_COMMUNITY): Payer: Self-pay

## 2020-05-16 ENCOUNTER — Encounter (HOSPITAL_COMMUNITY)
Admission: RE | Admit: 2020-05-16 | Discharge: 2020-05-16 | Disposition: A | Discharge status: Home / Self Care | Payer: Medicare HMO | Source: Ambulatory Visit | Attending: Interventional Radiology | Admitting: Interventional Radiology

## 2020-05-16 ENCOUNTER — Other Ambulatory Visit (HOSPITAL_COMMUNITY): Payer: Medicare HMO

## 2020-05-16 ENCOUNTER — Other Ambulatory Visit: Payer: Self-pay | Admitting: Radiology

## 2020-05-16 ENCOUNTER — Other Ambulatory Visit: Payer: Self-pay

## 2020-05-16 DIAGNOSIS — Z20822 Contact with and (suspected) exposure to covid-19: Secondary | ICD-10-CM | POA: Insufficient documentation

## 2020-05-16 DIAGNOSIS — Z01812 Encounter for preprocedural laboratory examination: Secondary | ICD-10-CM | POA: Insufficient documentation

## 2020-05-16 HISTORY — DX: Acute myocardial infarction, unspecified: I21.9

## 2020-05-16 LAB — CBC WITH DIFFERENTIAL/PLATELET
Abs Immature Granulocytes: 0.02 10*3/uL (ref 0.00–0.07)
Basophils Absolute: 0 10*3/uL (ref 0.0–0.1)
Basophils Relative: 0 %
Eosinophils Absolute: 0.1 10*3/uL (ref 0.0–0.5)
Eosinophils Relative: 1 %
HCT: 39.5 % (ref 39.0–52.0)
Hemoglobin: 13.1 g/dL (ref 13.0–17.0)
Immature Granulocytes: 0 %
Lymphocytes Relative: 18 %
Lymphs Abs: 1.1 10*3/uL (ref 0.7–4.0)
MCH: 31.7 pg (ref 26.0–34.0)
MCHC: 33.2 g/dL (ref 30.0–36.0)
MCV: 95.6 fL (ref 80.0–100.0)
Monocytes Absolute: 0.5 10*3/uL (ref 0.1–1.0)
Monocytes Relative: 8 %
Neutro Abs: 4.6 10*3/uL (ref 1.7–7.7)
Neutrophils Relative %: 73 %
Platelets: 273 10*3/uL (ref 150–400)
RBC: 4.13 MIL/uL — ABNORMAL LOW (ref 4.22–5.81)
RDW: 13.5 % (ref 11.5–15.5)
WBC: 6.2 10*3/uL (ref 4.0–10.5)
nRBC: 0 % (ref 0.0–0.2)

## 2020-05-16 LAB — PROTIME-INR
INR: 0.9 (ref 0.8–1.2)
Prothrombin Time: 12.5 seconds (ref 11.4–15.2)

## 2020-05-16 LAB — BASIC METABOLIC PANEL
Anion gap: 6 (ref 5–15)
BUN: 24 mg/dL — ABNORMAL HIGH (ref 8–23)
CO2: 31 mmol/L (ref 22–32)
Calcium: 10.5 mg/dL — ABNORMAL HIGH (ref 8.9–10.3)
Chloride: 98 mmol/L (ref 98–111)
Creatinine, Ser: 1.16 mg/dL (ref 0.61–1.24)
GFR, Estimated: >60 mL/min (ref >60)
Glucose, Bld: 117 mg/dL — ABNORMAL HIGH (ref 70–99)
Potassium: 4.2 mmol/L (ref 3.5–5.1)
Sodium: 135 mmol/L (ref 135–145)

## 2020-05-16 LAB — SARS CORONAVIRUS 2 (TAT 6-24 HRS): SARS Coronavirus 2: NEGATIVE

## 2020-05-16 LAB — PLATELET INHIBITION P2Y12: Platelet Function  P2Y12: 21 [PRU] — ABNORMAL LOW (ref 182–335)

## 2020-05-16 NOTE — Progress Notes (Signed)
Anesthesia PAT Evaluation:  Case: [id number] Date/Time: 05/20/20 0815   Procedure: RADIOLOGY WITH ANESTHESIA  LEFT ICA STENTING (Left )   Anesthesia type: General   Pre-op diagnosis: STENOSIS   Location: [facility]   Surgeons: Luanne Bras, MD      DISCUSSION: Patient is a 70 year old male scheduled for the above procedure. 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   History includes former smoker (quit 01/19/01), HTN, CAD (MI s/p PTCA OM 1997; unsuccessful PTCA of subtotally occluded OM2 with left-to-left collaterals, 30-40% RCA, medical therapy 02/27/00), DM2 (diet controlled), CVA (03/15/20). 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   - Admission 03/15/20-03/17/20 for acute left PCA infarct. Over the previous two days he had developed a severe frontal with difficulty in recall memory. When symptoms recurred he called his daughter is Dorthula Rue to report feeling disoriented and scared. She also noticed some intermittent expressive aphasia. A friend took him to [facility] ED. Head CT was negative. He could not tolerate an MRI without sedation, so he was transferred to [facility] where MRI showed acute infarct in the left PCA territory affecting left temporal and occipital lobes with minimal petechial blood products and an old left temporoparietal infarct. Neurology consulted. CT angiogram of the head and neck was done which showed left PCA occlusion with moderate stenosis at P2 P3 junction, 60% left carotid bulb stenosis, 30% stenosis bilateral vertebral artery origins, 50% stenosis of the right V4 segment, severe left vertebral artery stenosis just beyond PICA with some antegrade flow, and 30% distal right M1 and 50% left M1 segment stenoses.  Echo showed normal LVEF, no wall motion abnormalities, moderate MAC without no MR, mean MV gradient of 3.0 mmHg, no evident of interatrial shunt. Neurologic symptoms overall improved. ASA +  Plavix for three months recommended then as directed per IR/neurology  recommendations in follow-up.   Since hospital discharge, he also reported right visual field deficits, although with some improvement. Other residual symptoms included intermittent issues with word finding and with short term memory loss. Daughter says he especially has difficulty with time and numbers. He is now living with her family is [place], but prior to CVA he was living along with his dog in [place] and was working as a Architect.   He had out-patient neurology follow-up with Dr. Jannifer Franklin on 04/01/20 and with IR 05/02/20 for diagnostic cerebral angiogram to further assess and determine if intervention warranted. Findings included 85% left proximal ICA stenosis, so Dr. Estanislado Pandy recommended  endovascular revascularization to prevent further ischemic events to the left anterior circulation. He was started on ASA 325 mg and Plavix 75 mg daily. 05/16/20 p2y12 was 21, result routed to Jannifer Franklin, PA-C. Defer adjustments, to anti-platelet regimen, if any, to IR.   I evaluated Mr. Ambrosini during his PAT visit, given known remote history of CAD/MI with no recent follow-up. In fact, last cardiology visit noted was from 2002 for attempted OM2. He cardiologist then was Mertie Moores, MD, but is now scheduled to see Dr. Michelle Piper on 06/05/20. He has not been having any known cardiac issues, but his daughter scheduled the appointment on her own just to get him reestablished with a cardiologist. He denied SOB, chest pain, edema, palpitations, syncope, orthopnea. Up until his recent CVA, he did the upkeep on his property which includes pushing and riding lawnmowing and cutting wood. He would also  take walks on occasion and went to [employer] to primarily do upper body weight training exercises. He did not get CV symptoms with these activities, and says he was able to work through to complete tasks (ie, did not have to take frequent stops to  regain energy or breath).   EKG showed SR, old inferior infarct, occasional PCA. Preoperative show normal H/H, PT/INR, BUN 24, Cr 1.6, calcium 10.5, non-fasting glucose 117 (A1c 6.5% 03/16/20).   Reviewed with anesthesia Finucane, Beth, DO. Patient with remote history of MI with PTCA OM in 1997 and unsuccessful attempt to open subtotally occluded OM2 (fills via left-to-left collaterals) in 2002 with otherwise 30-40% RCA disease and luminal irregularities in the LAD and LCX. His diet controlled DM2 is well controlled. He stopped smoking in 2003 and has been off alcohol since CVA hospitalization. Recent activities (prior to CVA) reported > 4 METS and asymptomatic. He is still struggling with word finding and memory issues since his stoke and procedure planned in hopes to reduce risk for future stroke. Anesthesia team to evaluate on the day of surgery. Confirmed he is currently on ASA 325 mg and Plavix 75 mg daily.   05/16/20 preprocedure COVID-19 test in process.    VS: BP 107/62   Pulse 75   Temp 36.6 C   Resp 18   Ht _0  (1.778 m)   Wt 85.6 kg   SpO2 100%   BMI 27.09 kg/m  Provider wore N95 and universal face mask. Patient and daughter wore universal face masks.  Heart RRR, no murmur noted. Lungs clear. No ankle edema.    PROVIDERS: Heywood Bene, PA-C is PCP [facility] Clancy GourdSharmaine Base, see Care Everywhere). Last visit 03/20/20 for hospital follow-up. Kathrynn Ducking, MD is neurologist. Last visit 04/01/20.  Luanne Bras, MD is IR Minus Breeding, MD will be cardiologist, to get established 06/05/20. Previously he saw Mertie Moores, MD, but last available records was a 2002 cath report.    LABS: Labs reviewed: Acceptable for surgery. A1c 6.5% on 03/16/20.  (all labs ordered are listed, but only abnormal results are displayed)  Labs Reviewed  CBC WITH DIFFERENTIAL/PLATELET - Abnormal; Notable for the following components:      Result Value   RBC 4.13 (*)    All other  components within normal limits  BASIC METABOLIC PANEL - Abnormal; Notable for the following components:   Glucose, Bld 117 (*)    BUN 24 (*)    Calcium 10.5 (*)    All other components within normal limits  PLATELET INHIBITION P2Y12 - Abnormal; Notable for the following components:   Platelet Function  P2Y12 21 (*)    All other components within normal limits  SARS CORONAVIRUS 2 (TAT 6-24 HRS)  PROTIME-INR     IMAGES: Cerebral Angiogram 05/02/20: IMPRESSION: - Severe approximately 85% plus stenosis of the proximal left internal carotid artery secondary to a circumferential atherosclerotic plaque. - Approximately 50% stenosis of the left middle cerebral artery mid M1 segment. - Angiographically occluded left posterior cerebral artery P1 segment. - Approximately 50% stenosis of the right posterior cerebral artery P1 segment, and 75% stenosis at the P2 P3 junction. - Occluded left vertebrobasilar junction just distal to the origin of the left posterior-inferior cerebellar artery. PLAN: Given the symptomatic nature of the left ICA stenosis, endovascular revascularization to prevent further ischemic events to the left anterior circulation was reviewed with the daughter and the patient... In the meantime, the patient was advised to take aspirin 325  mg a day in addition to the 75 mg of Plavix per day. A platelet function test will be performed at the time of anesthesia consultation.  CTA Head & Neck 03/16/20: IMPRESSION: 1. Aortic atherosclerosis. 2. Atherosclerotic disease at both carotid bifurcations. No measurable stenosis on the right. 60% stenosis of the distal bulb on the left. 3. 30% stenosis of both vertebral artery origins. 50% stenosis of the right V4 segment. Severe stenosis of the left vertebral artery just beyond PICA, but with some antegrade flow. 4. 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 Stenoses in the right posterior cerebral artery,  moderate at the P2 P3 junction. 5. 30% stenosis of the distal right M1 segment. 50% stenosis of the left M1 segment.  MRI Brain 03/16/20: IMPRESSION: 1. Acute infarction in the left PCA territory affecting the left posteromedial temporal lobe and occipital lobe. Minimal petechial blood products. No frank hematoma. 2. 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 3. Question abnormal flow in the left vertebral artery.   EKG: 03/15/20: Ventricular rate at 107 bpm Sinus arrhythmia Ventricular premature complex Prominent P waves, nondiagnostic Inferior infarct, old Baseline wander in lead(s) V3 Abnormal ECG Confirmed by Carmin Muskrat [id number]) on 03/15/2020 8:16:25 PM   CV: Echo 03/16/20: IMPRESSIONS  1. Left ventricular ejection fraction, by estimation, is 55 to 60%. The  left ventricle has normal function. The left ventricle has no regional  wall motion abnormalities. Left ventricular diastolic parameters are  consistent with Grade I diastolic  dysfunction (impaired relaxation).  2. Right ventricular systolic function is normal. The right ventricular  size is normal.  3. The mitral valve is abnormal. No evidence of mitral valve  regurgitation. The mean mitral valve gradient is 3.0 mmHg with average  heart rate of 68 bpm. Moderate mitral annular calcification.  4. The aortic valve is tricuspid. There is mild calcification of the  aortic valve. Aortic valve regurgitation is not visualized.  5. The inferior vena cava is normal in size with greater than 50%  respiratory variability, suggesting right atrial pressure of 3 mmHg.  6. Agitated saline contrast bubble study was negative, with no evidence  of any interatrial shunt.  - Comparison(s): No prior Echocardiogram.   Cardiac cath 02/27/00: ANGIOGRAPHY:   - LM: Relatively normal. There are no discrete stenoses. - LAD: Mild irregularities.  There are no discrete stenoses in the LAD or diagonal system.  The LAD  reaches the apex and supplies the inferoapical wall. - LCX: Moderate sized vessel.  It gives off a relatively large OM1 which is normal.  The OM2 is subtotally occluded and fills via left to left collaterals.  The distal left circumflex artery has only minor luminal irregularities. - RCA: Tortuous and has moderate irregularities.  There is a proximal 40% stenosis.  The mid RCA has a eccentric 30-40% stenosis.  There are diffuse minor irregularities throughout the distal RCA and PLA branch as well as the PDA.  There is some collateral filling from the distal RCA up to the circumflex marginal distribution. - LV: Reveals well preserved left ventricular systolic function with an ejection fraction of 75%.  There is no mitral regurgitation. CONCLUSIONS: 1. Coronary artery disease, primarily involving the second obtuse marginal artery. 2. Moderate disease involving the left circumflex artery. 3. Well preserved left ventricular systolic function. 4. Unsuccessful percutaneous transluminal coronary angioplasty of the left circumflex/obtuse marginal artery due to inability to cross with a guidewire.  There were no complications.   Past Medical History:  Diagnosis Date  . Hypertension   . Myocardial infarction ([place])    1990's  . Stroke [facility]) 03/15/2020    Past Surgical History:  Procedure Laterality Date  . IR ANGIO INTRA EXTRACRAN SEL COM CAROTID INNOMINATE BILAT MOD SED  05/02/2020  . IR ANGIO VERTEBRAL SEL SUBCLAVIAN INNOMINATE UNI L MOD SED  05/02/2020  . IR ANGIO VERTEBRAL SEL VERTEBRAL UNI R MOD SED  05/02/2020  . IR RADIOLOGIST EVAL & MGMT  03/27/2020  . IR US GUIDE VASC ACCESS RIGHT  05/02/2020    MEDICATIONS: . acetaminophen (TYLENOL) 500 MG tablet  . aspirin 325 MG tablet  . atorvastatin (LIPITOR) 80 MG tablet  . b complex vitamins capsule  . clopidogrel (PLAVIX) 75 MG tablet  . hydrochlorothiazide (HYDRODIURIL) 25 MG tablet  . lisinopril (ZESTRIL) 40 MG tablet  . potassium chloride SA  (KLOR-CON) 20 MEQ tablet  . traZODone (DESYREL) 50 MG tablet  . VITAMIN D-VITAMIN K PO   No current facility-administered medications for this encounter.    Myra Gianotti, PA-C Surgical Short Stay/Anesthesiology [facility] Phone [phone] [facility] Phone [phone] 05/16/2020 4:31 PM

## 2020-05-16 NOTE — Progress Notes (Signed)
PCP -  Dr. Rueben Bash Cardiologist - Patient has an appointment with Dr. Antoine Poche but has not been to see him yet. Patient had a cardiologist back in the 1990's but can't remember his name.   PPM/ICD - n/a Device Orders - n/a Rep Notified - n/a  Chest x-ray - n/a EKG - 03/15/20 Stress Test - 1990's ECHO - 03/16/20 Cardiac Cath - 1990's  Sleep Study - denies CPAP - n/a  Blood Thinner Instructions: Continue to take Aspirin and Plavix prior to procedure per MD Aspirin Instructions:  COVID TEST- 05/16/20. Pending. Patient is aware to quarantine.    Anesthesia review: Yes. Per MD order. Jaynie Collins, PA over to see patient in PAT during his PAT appointment.   Patient denies shortness of breath, fever, cough and chest pain at PAT appointment   All instructions explained to the patient, with a verbal understanding of the material. Patient agrees to go over the instructions while at home for a better understanding. Patient also instructed to self quarantine after being tested for COVID-19. The opportunity to ask questions was provided.

## 2020-05-16 NOTE — Anesthesia Preprocedure Evaluation (Addendum)
Anesthesia Evaluation  Patient identified by MRN, date of birth, ID band Patient awake    Reviewed: Allergy & Precautions, NPO status , Patient's Chart, lab work & pertinent test results  Airway Mallampati: II  TM Distance: >3 FB Neck ROM: Full    Dental  (+) Teeth Intact   Pulmonary former smoker,    Pulmonary exam normal        Cardiovascular hypertension, Pt. on medications + CAD, + Past MI (1990s) and + Cardiac Stents (on ASA and Plavix)   Rhythm:Regular Rate:Normal  Echo 03/16/20: IMPRESSIONS  1. Left ventricular ejection fraction, by estimation, is 55 to 60%. The  left ventricle has normal function. The left ventricle has no regional  wall motion abnormalities. Left ventricular diastolic parameters are  consistent with Grade I diastolic  dysfunction (impaired relaxation).  2. Right ventricular systolic function is normal. The right ventricular  size is normal.  3. The mitral valve is abnormal. No evidence of mitral valve  regurgitation. The mean mitral valve gradient is 3.0 mmHg with average  heart rate of 68 bpm. Moderate mitral annular calcification.  4. The aortic valve is tricuspid. There is mild calcification of the  aortic valve. Aortic valve regurgitation is not visualized.  5. The inferior vena cava is normal in size with greater than 50%  respiratory variability, suggesting right atrial pressure of 3 mmHg.  6. Agitated saline contrast bubble study was negative, with no evidence  of any interatrial shunt.     Neuro/Psych Carotid stenosis  CVA (02/22) negative psych ROS   GI/Hepatic negative GI ROS, Neg liver ROS,   Endo/Other  diabetes, Type 2  Renal/GU negative Renal ROS  negative genitourinary   Musculoskeletal negative musculoskeletal ROS (+)   Abdominal   Peds  Hematology negative hematology ROS (+)   Anesthesia Other Findings  CT angiogram of the head and neck was done which showed  left PCA occlusion with moderate stenosis at P2 P3 junction, 60% left carotid bulb stenosis, 30% stenosis bilateral vertebral artery origins, 50% stenosis of the right V4 segment, severe left vertebral artery stenosis just beyond PICA with some antegrade flow, and 30% distal right M1 and 50% left M1 segment stenoses  Reproductive/Obstetrics                          Anesthesia Physical Anesthesia Plan  ASA: III  Anesthesia Plan: MAC   Post-op Pain Management:    Induction: Intravenous  PONV Risk Score and Plan: 2 and Ondansetron, Dexamethasone and Treatment may vary due to age or medical condition  Airway Management Planned: Simple Face Mask, Natural Airway and Nasal Cannula  Additional Equipment: Arterial line  Intra-op Plan:   Post-operative Plan:   Informed Consent: I have reviewed the patients History and Physical, chart, labs and discussed the procedure including the risks, benefits and alternatives for the proposed anesthesia with the patient or authorized representative who has indicated his/her understanding and acceptance.     Dental advisory given  Plan Discussed with: CRNA  Anesthesia Plan Comments: (PAT note written 05/16/2020 by Myra Gianotti, PA-C. Lab Results      Component                Value               Date                      WBC  6.2                 05/16/2020                HGB                      13.1                05/16/2020                HCT                      39.5                05/16/2020                MCV                      95.6                05/16/2020                PLT                      273                 05/16/2020           Lab Results      Component                Value               Date                      NA                       135                 05/16/2020                K                        4.2                 05/16/2020                CO2                      31                   05/16/2020                GLUCOSE                  117 (H)             05/16/2020                BUN                      24 (H)              05/16/2020                CREATININE               1.16  05/16/2020                CALCIUM                  10.5 (H)            05/16/2020                GFRNONAA                 >60                 05/16/2020                GFRAA                                        08/12/2008            >60        The eGFR has been calculated using the MDRD equation. This calculation has not been validated in all clinical situations. eGFR's persistently <60 mL/min signify possible Chronic Kidney Disease. Echo 03/16/20: IMPRESSIONS  1. Left ventricular ejection fraction, by estimation, is 55 to 60%. The  left ventricle has normal function. The left ventricle has no regional  wall motion abnormalities. Left ventricular diastolic parameters are  consistent with Grade I diastolic  dysfunction (impaired relaxation).  2. Right ventricular systolic function is normal. The right ventricular  size is normal.  3. The mitral valve is abnormal. No evidence of mitral valve  regurgitation. The mean mitral valve gradient is 3.0 mmHg with average  heart rate of 68 bpm. Moderate mitral annular calcification.  4. The aortic valve is tricuspid. There is mild calcification of the  aortic valve. Aortic valve regurgitation is not visualized.  5. The inferior vena cava is normal in size with greater than 50%  respiratory variability, suggesting right atrial pressure of 3 mmHg.  6. Agitated saline contrast bubble study was negative, with no evidence  of any interatrial shunt.  - Comparison(s): No prior Echocardiogram.  )     Anesthesia Quick Evaluation

## 2020-05-17 ENCOUNTER — Other Ambulatory Visit: Payer: Self-pay | Admitting: Student

## 2020-05-20 ENCOUNTER — Other Ambulatory Visit: Payer: Self-pay

## 2020-05-20 ENCOUNTER — Inpatient Hospital Stay (HOSPITAL_COMMUNITY): Payer: Medicare HMO | Admitting: Vascular Surgery

## 2020-05-20 ENCOUNTER — Inpatient Hospital Stay (HOSPITAL_COMMUNITY)
Admission: RE | Admit: 2020-05-20 | Discharge: 2020-05-21 | DRG: 036 | Disposition: A | Discharge status: Home / Self Care | Payer: Medicare HMO | Attending: Student | Admitting: Student

## 2020-05-20 ENCOUNTER — Inpatient Hospital Stay (HOSPITAL_COMMUNITY): Payer: Medicare HMO

## 2020-05-20 ENCOUNTER — Inpatient Hospital Stay (HOSPITAL_COMMUNITY)
Admission: RE | Admit: 2020-05-20 | Discharge: 2020-05-20 | Disposition: A | Discharge status: Home / Self Care | Payer: Medicare HMO | Source: Ambulatory Visit | Attending: Interventional Radiology | Admitting: Interventional Radiology

## 2020-05-20 ENCOUNTER — Encounter (HOSPITAL_COMMUNITY)
Admission: RE | Disposition: A | Discharge status: Home / Self Care | Payer: Self-pay | Source: Home / Self Care | Attending: Interventional Radiology

## 2020-05-20 ENCOUNTER — Inpatient Hospital Stay (HOSPITAL_COMMUNITY): Payer: Medicare HMO | Admitting: Anesthesiology

## 2020-05-20 ENCOUNTER — Encounter (HOSPITAL_COMMUNITY): Payer: Self-pay | Admitting: Interventional Radiology

## 2020-05-20 DIAGNOSIS — I2510 Atherosclerotic heart disease of native coronary artery without angina pectoris: Secondary | ICD-10-CM | POA: Diagnosis present

## 2020-05-20 DIAGNOSIS — I6522 Occlusion and stenosis of left carotid artery: Principal | ICD-10-CM | POA: Diagnosis present

## 2020-05-20 DIAGNOSIS — Z823 Family history of stroke: Secondary | ICD-10-CM

## 2020-05-20 DIAGNOSIS — R482 Apraxia: Secondary | ICD-10-CM | POA: Diagnosis present

## 2020-05-20 DIAGNOSIS — H53461 Homonymous bilateral field defects, right side: Secondary | ICD-10-CM | POA: Diagnosis present

## 2020-05-20 DIAGNOSIS — Z7902 Long term (current) use of antithrombotics/antiplatelets: Secondary | ICD-10-CM | POA: Diagnosis not present

## 2020-05-20 DIAGNOSIS — Z8249 Family history of ischemic heart disease and other diseases of the circulatory system: Secondary | ICD-10-CM | POA: Diagnosis not present

## 2020-05-20 DIAGNOSIS — Z87891 Personal history of nicotine dependence: Secondary | ICD-10-CM

## 2020-05-20 DIAGNOSIS — I6602 Occlusion and stenosis of left middle cerebral artery: Secondary | ICD-10-CM | POA: Diagnosis present

## 2020-05-20 DIAGNOSIS — I10 Essential (primary) hypertension: Secondary | ICD-10-CM | POA: Diagnosis present

## 2020-05-20 DIAGNOSIS — S7001XA Contusion of right hip, initial encounter: Secondary | ICD-10-CM | POA: Diagnosis not present

## 2020-05-20 DIAGNOSIS — I6623 Occlusion and stenosis of bilateral posterior cerebral arteries: Secondary | ICD-10-CM | POA: Diagnosis present

## 2020-05-20 DIAGNOSIS — Z7982 Long term (current) use of aspirin: Secondary | ICD-10-CM

## 2020-05-20 DIAGNOSIS — Z8673 Personal history of transient ischemic attack (TIA), and cerebral infarction without residual deficits: Secondary | ICD-10-CM | POA: Diagnosis not present

## 2020-05-20 DIAGNOSIS — Z79899 Other long term (current) drug therapy: Secondary | ICD-10-CM

## 2020-05-20 DIAGNOSIS — M7981 Nontraumatic hematoma of soft tissue: Secondary | ICD-10-CM

## 2020-05-20 DIAGNOSIS — I252 Old myocardial infarction: Secondary | ICD-10-CM | POA: Diagnosis not present

## 2020-05-20 DIAGNOSIS — I70201 Unspecified atherosclerosis of native arteries of extremities, right leg: Secondary | ICD-10-CM | POA: Diagnosis present

## 2020-05-20 HISTORY — PX: IR ANGIO VERTEBRAL SEL SUBCLAVIAN INNOMINATE UNI R MOD SED: IMG5365

## 2020-05-20 HISTORY — PX: IR INTRAVSC STENT CERV CAROTID W/EMB-PROT MOD SED INCL ANGIO: IMG2303

## 2020-05-20 HISTORY — PX: IR US GUIDE VASC ACCESS RIGHT: IMG2390

## 2020-05-20 HISTORY — PX: RADIOLOGY WITH ANESTHESIA: SHX6223

## 2020-05-20 LAB — GLUCOSE, CAPILLARY: Glucose-Capillary: 154 mg/dL — ABNORMAL HIGH (ref 70–99)

## 2020-05-20 LAB — PLATELET INHIBITION P2Y12: Platelet Function  P2Y12: 9 [PRU] — ABNORMAL LOW (ref 182–335)

## 2020-05-20 LAB — TYPE AND SCREEN
ABO/RH(D): A POS
Antibody Screen: NEGATIVE

## 2020-05-20 LAB — POCT ACTIVATED CLOTTING TIME
Activated Clotting Time: 178 seconds
Activated Clotting Time: 208 seconds

## 2020-05-20 LAB — ABO/RH: ABO/RH(D): A POS

## 2020-05-20 LAB — MRSA PCR SCREENING: MRSA by PCR: NEGATIVE

## 2020-05-20 LAB — HEPARIN LEVEL (UNFRACTIONATED): Heparin Unfractionated: 0.17 IU/mL — ABNORMAL LOW (ref 0.30–0.70)

## 2020-05-20 IMAGING — US IR INTRAVSC STENT CERV CAROTID W/ EMB-PROT
1 of 2 series · 10 of 24 positions shown · non-contrast
Comparison: Diagnostic catheter arteriogram [DATE].

CLINICAL DATA: History of symptomatic severe left internal carotid
artery proximal stenosis.

EXAM:
INTRAVSC STENT CERV CAROTID W/ EMB-PROT
TECHNIQUE: Informed written consent was obtained from the patient after a
thorough discussion of the procedural risks, benefits and
alternatives. All questions were addressed. Maximal Sterile Barrier
Technique was utilized including caps, mask, sterile gowns, sterile
gloves, sterile drape, hand hygiene and skin antiseptic. A timeout
was performed prior to the initiation of the procedure.

[Series 300: dr. (person_name) · 10 of 145 slices shown]
[im 1/145]
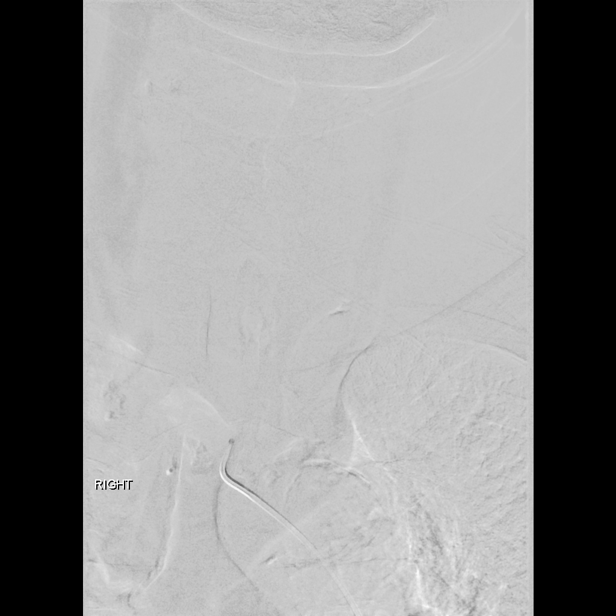
[im 14/145]
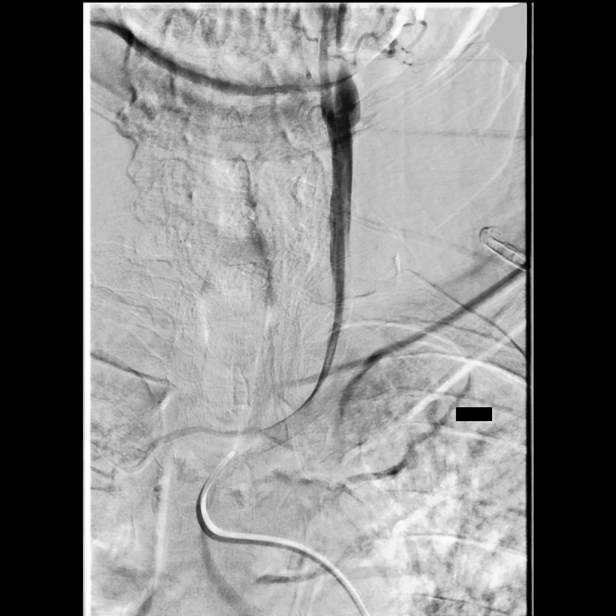
[im 33/145]
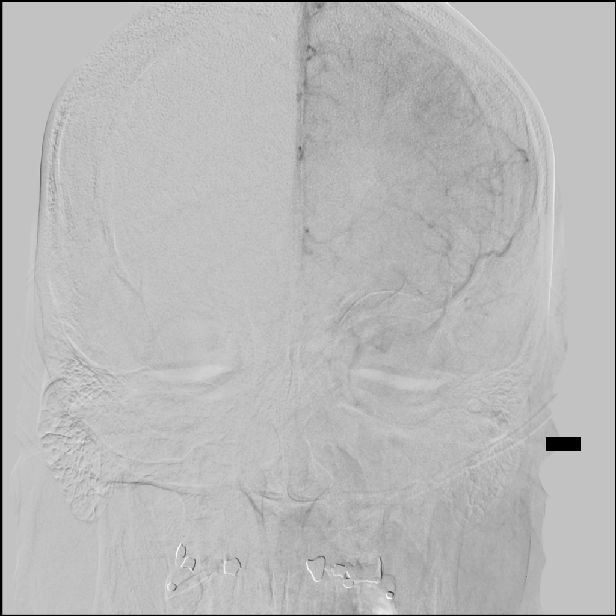
[im 46/145]
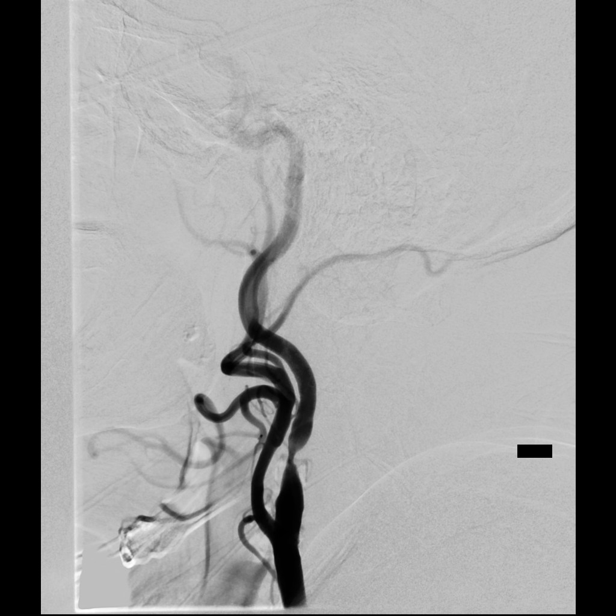
[im 59/145]
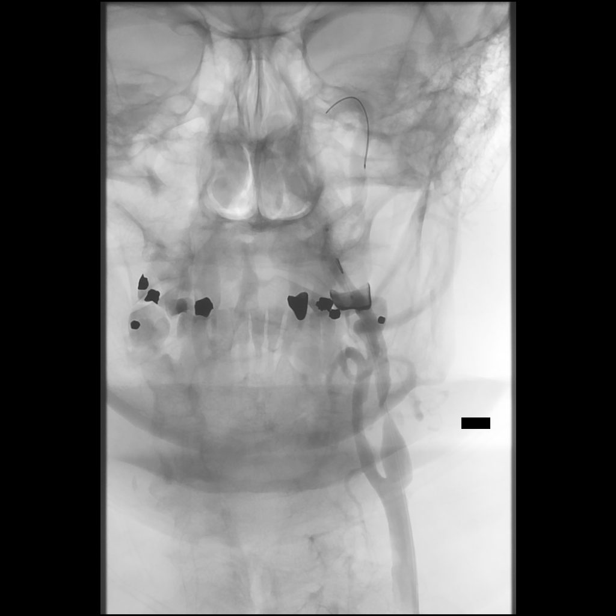
[im 79/145]
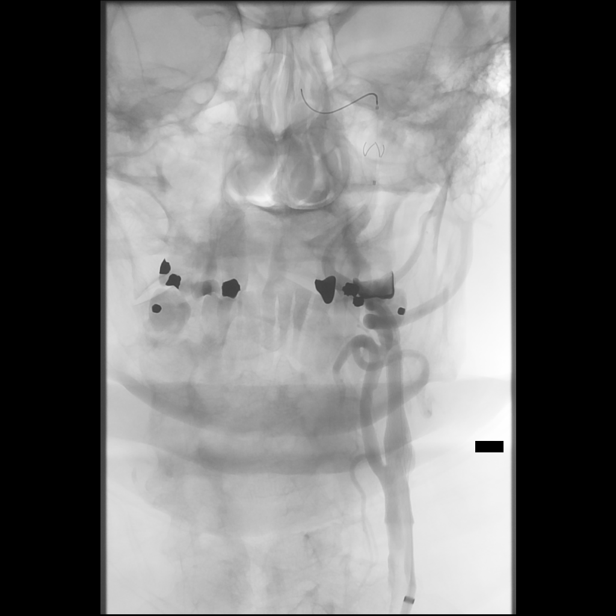
[im 92/145]
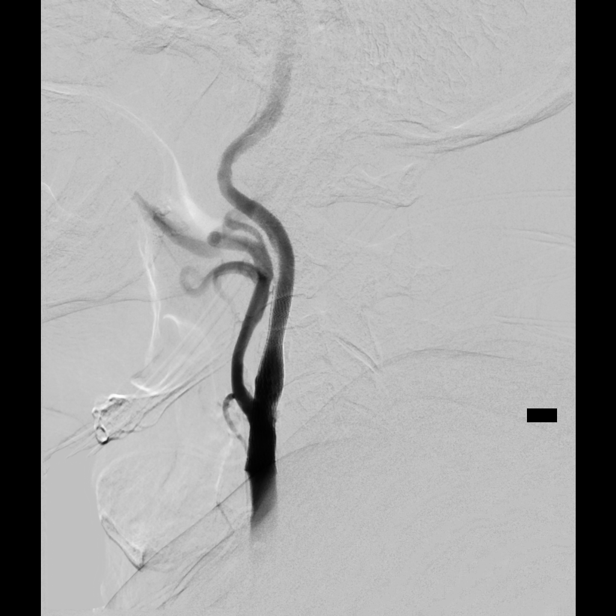
[im 112/145]
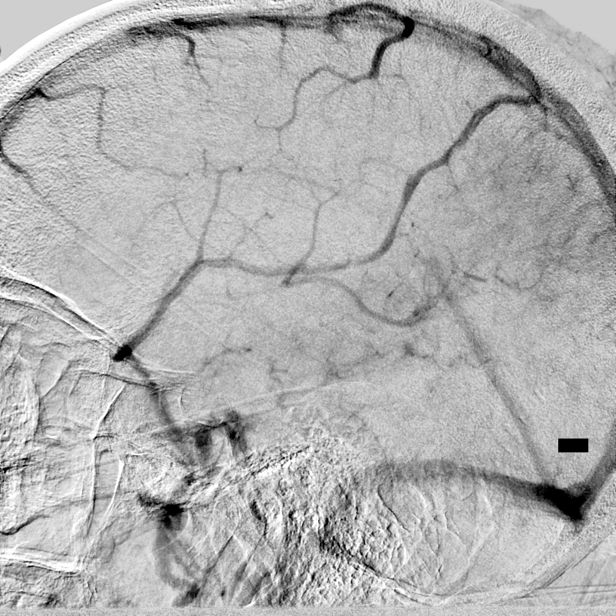
[im 125/145]
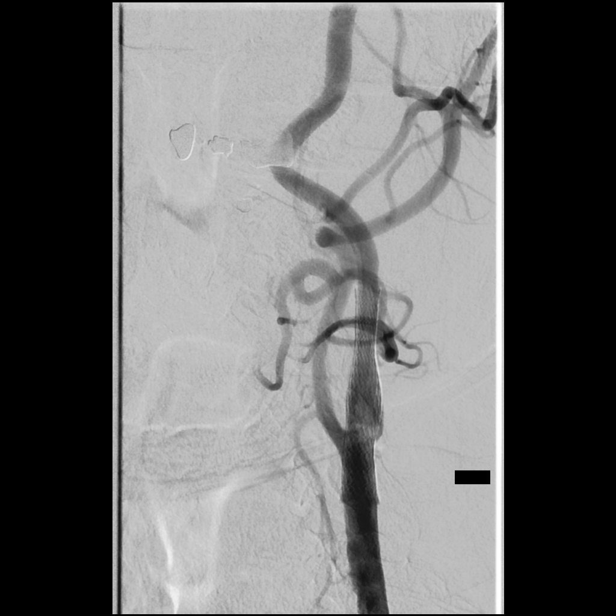
[im 138/145]
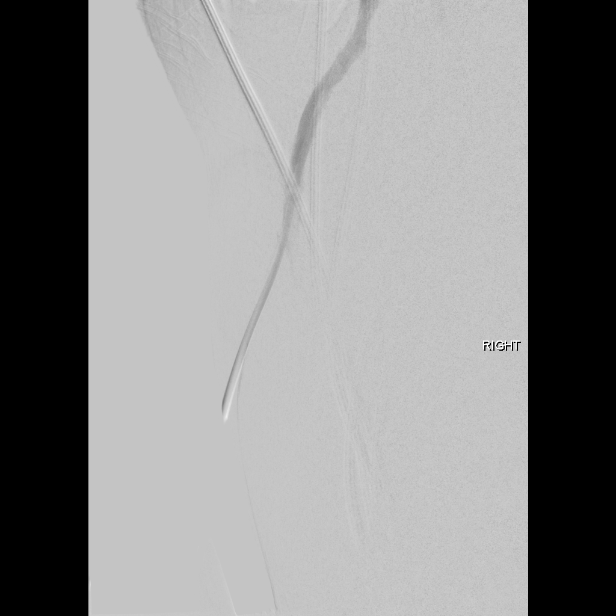

[10 of 24 positions shown; findings below may reference images not displayed]

MEDICATIONS:
Heparin 3,000 units IA. Ancef 2 g IV antibiotic was administered
within 1 hour of the procedure.

ANESTHESIA/SEDATION:
MAC anesthesia as per the [REDACTED] at [REDACTED].

CONTRAST:  Isovue 300 approximately 90 mL.

FLUOROSCOPY TIME:  Fluoroscopy Time: 41 minutes 0 seconds ([OL]
mGy).

COMPLICATIONS:
None immediate.
The right groin was prepped and draped in the usual sterile fashion.
Thereafter using modified Seldinger technique, transfemoral access
into the right common femoral artery was obtained without
difficulty. Over a 0.035 inch guidewire, an 8 French 5 cm Pinnacle
sheath was inserted. Through this, and also over 0.035 inch
guidewire, a 5 French JB 1 catheter was advanced to the aortic arch
region and selectively positioned in left common carotid artery.
FINDINGS: The left common carotid arteriogram demonstrates the left external
carotid artery and its major branches to be widely patent.

The left internal carotid artery just distal to the bulb has a
severe high-grade stenosis secondary to a smooth arteriosclerotic
plaque.

No evidence of ulcerations or of intraluminal filling defects noted.

The vessel is, otherwise, seen to opacify to the cranial skull base.
The petrous, cavernous and supraclinoid segments are widely patent.

The left middle cerebral artery opacifies into the capillary and
venous phases.

Again demonstrated is approximately 50-60% stenosis of the mid M1
segment.

The left anterior cerebral artery opacifies into the capillary and
venous phases.

ENDOVASCULAR REVASCULARIZATION OF SEVERELY STENOTIC LEFT INTERNAL
CAROTID ARTERY PROXIMALLY WITH DISTAL PROTECTION

The diagnostic JB 1 catheter in the left common carotid artery was
exchanged over a 0.035 inch 300 cm Rosen exchange guidewire for a 90
cm 6 FERDINAND. The guidewire was removed. Good
aspiration obtained from the hub of the FERDINAND. A
gentle control arteriogram performed through the sheath demonstrated
no evidence of spasms, dissections or of intraluminal filling
defects.

Measurements were then performed of the left internal carotid artery
its most normal segment just distal to the stenosis, and also at the
cervical petrous junction. The left common carotid artery distally
was also evaluated in the AP and lateral projections.

A [DATE] French NAV Emboshield filter was then prepped and purged with
heparinized saline infusion in its housing. After clearing the air
antegradely and retrogradely, the filter device was captured into
the microcatheter delivery system. The combination of the 0.014 inch
wire, and the delivery catheter was retrieved. A J configuration was
given to the distal end of the 0.014 inch micro guidewire.

Using biplane roadmap technique and constant fluoroscopic guidance
in a coaxial manner and with constant heparinized saline infusion,
the combination of the filter delivery system was then advanced to
just distal to the 6 FERDINAND.

Using a torque device, the wire was advanced with the delivery
microcatheter across the high-grade stenosis without event. The
combination was advanced to the distal cervical segment at the
cranial skull base. The wire was in the horizontal petrous segment.

The Emboshield was then delivered in the usual manner by retrieving
the delivery microcatheter. This was then removed maintaining
stability at the distal end of the micro guidewire.

A control arteriogram performed through the FERDINAND
demonstrated no evidence of spasms, dissections or of the filling
defects.

A 6-8 mm x 40 mm Xact stent delivery system was then prepped and
purged retrogradely with heparinized saline infusion.

Using the rapid exchange technique, the stent delivery system was
then advanced to the severe stenosis. The distal and proximal
markers were then positioned adequate distance from the severe
stenosis.

Stent was then deployed in the usual manner without difficulty.

The delivery apparatus was then retrieved and removed. A control
arteriogram performed through the FERDINAND demonstrated
excellent apposition proximally and distally with modestly improved
caliber of the previously noted high-grade stenosis.

A 5 mm x 30 mm 0.014 Viatrac balloon was then prepped and purged
anteriorly and retrogradely.

Using the rapid exchange technique, this was again advanced to the
left internal carotid artery proximally within the stent. The
proximal and distal markers were aligned. A control inflation was
then performed with a micro inflation syringe device via micro
tubing slowly to 9.1 atmospheres where it was maintained for
approximately 1 minute.

The balloon was deflated and retrieved and removed maintaining safe
position of the tip of the distal Emboshield. A control arteriogram
performed through the FERDINAND demonstrated excellent
apposition and nearly complete obliteration of the previously noted
stenosis.

More distally intracranially the distal internal carotid artery, and
the left middle and left anterior cerebral artery demonstrated wide
patency with again the left middle cerebral artery approximately
50-60% stenosis more regularly and clearly visible.

An Emboshield capture microcatheter was then advanced again using
the rapid exchange technique into the proximal marker on the
Emboshield. This was then retrieved into the capture microcatheter
by retrieving the micro guidewire. The combination was then
retrieved under constant observation without any entanglement within
the stent. Control arteriograms were then performed at [DATE] and 30
minutes post stent angioplasty. These continued to demonstrate no
evidence of intra stent filling defects or irregularities.

However, patient was given a total of 3 mg of intra-arterial
Integrilin in order to prevent platelet aggregation within the
stent.

Final control arteriogram performed through the FERDINAND
in the left common carotid artery continued to demonstrate excellent
apposition of flow proximally within the stent, with no change
intracranially in terms of intraluminal filling defects or of vessel
occlusions.

The FERDINAND was then removed. The 8 French neurovascular
sheath was then removed with manual pressure held due to severe
arteriosclerosis of the right common femoral artery.

Following the manual compression, a small hematoma was noted which
was flattened with manual compression. At the end of this, the groin
puncture site appeared soft without any clinical evidence of pain.
Distal pulses remained Dopplerable in both feet unchanged.

The patient was then transferred to the PACU and then neuro ICU for
overnight close neurologic observations, maintaining controlled
blood pressure, and IV heparin.

Overnight, the patient remained stable. The following morning, the
IV heparin was stopped and the patient was switched to aspirin 81 mg
a day, and 75 mg. An ultrasound of the right groin obtained the day
before right after the procedure, and the following morning
continued to demonstrate stable hematoma in the right groin without
evidence of pseudo aneurysm.

Patient was then mobilized with help. He was able to take in a
normal meal without event.

Clinically, the patient continued to demonstrated a right homonymous
hemianopsia related to previous ischemic infarct. Neurologically,
otherwise, continued to have difficulty with word finding though
slightly improved according to the patient.

The patient did have visual apraxia being unable to read correct
time on the clock on the wall.

Patient was then discharged home under the care of his daughter. Of
importance was the patient maintaining adequate hydration, and to
avoid heavy stooping, bending or lifting weights above 10 pounds for
approximately 14 days. Patient was advised not to drive either.

Also the patient was asked to avoid heavy lifting and climbing up
and down staircases. He was asked to maintained close monitoring of
the right groin. Should he develop further bruising, swelling, or
pain with weight-bearing he was asked to report to the ER.

Patient and the daughter expressed understanding and agreement with
the above management plan.
IMPRESSION: Status post endovascular revascularization of severely stenotic
symptomatic left internal carotid artery stenosis with distal
protection with stent assisted angioplasty as described.

PLAN:
Follow-up in the clinic 2 weeks post discharge. Patient to take
aspirin 81 mg a day, and Plavix 37.5 mg per day.

## 2020-05-20 SURGERY — RADIOLOGY WITH ANESTHESIA
Anesthesia: General | Laterality: Left

## 2020-05-20 MED ORDER — HEPARIN SODIUM (PORCINE) 1000 UNIT/ML IJ SOLN
INTRAMUSCULAR | Status: DC | PRN
  Administered 2020-05-20: 3000 [IU] via INTRAVENOUS
  Administered 2020-05-20: 1000 [IU] via INTRAVENOUS

## 2020-05-20 MED ORDER — CHLORHEXIDINE GLUCONATE 0.12 % MT SOLN: 15.0000 mL | Freq: Once | OROMUCOSAL | Status: AC

## 2020-05-20 MED ORDER — EPTIFIBATIDE 20 MG/10ML IV SOLN
INTRAVENOUS | Status: AC
  Filled 2020-05-20: qty 10

## 2020-05-20 MED ORDER — HYDROCHLOROTHIAZIDE 25 MG PO TABS
25.0000 mg | ORAL_TABLET | Freq: Every day | ORAL | Status: DC
  Administered 2020-05-21: 25 mg via ORAL
  Filled 2020-05-20: qty 1

## 2020-05-20 MED ORDER — PHENYLEPHRINE HCL-NACL 10-0.9 MG/250ML-% IV SOLN
INTRAVENOUS | Status: DC | PRN
  Administered 2020-05-20: 10 ug/min via INTRAVENOUS

## 2020-05-20 MED ORDER — EPTIFIBATIDE 20 MG/10ML IV SOLN
INTRAVENOUS | Status: AC | PRN
  Administered 2020-05-20 (×2): 1.5 mg via INTRAVENOUS

## 2020-05-20 MED ORDER — CLOPIDOGREL BISULFATE 75 MG PO TABS: 75.0000 mg | ORAL_TABLET | Freq: Every day | ORAL | Status: DC

## 2020-05-20 MED ORDER — HEPARIN (PORCINE) 25000 UT/250ML-% IV SOLN
500.0000 [IU]/h | INTRAVENOUS | Status: DC
  Administered 2020-05-20: 500 [IU]/h via INTRAVENOUS
  Filled 2020-05-20: qty 250

## 2020-05-20 MED ORDER — LIDOCAINE HCL 1 % IJ SOLN
INTRAMUSCULAR | Status: AC
  Filled 2020-05-20: qty 20

## 2020-05-20 MED ORDER — EPHEDRINE SULFATE-NACL 50-0.9 MG/10ML-% IV SOSY
PREFILLED_SYRINGE | INTRAVENOUS | Status: DC | PRN
  Administered 2020-05-20: 5 mg via INTRAVENOUS

## 2020-05-20 MED ORDER — ONDANSETRON HCL 4 MG/2ML IJ SOLN
INTRAMUSCULAR | Status: DC | PRN
  Administered 2020-05-20: 4 mg via INTRAVENOUS

## 2020-05-20 MED ORDER — MIDAZOLAM HCL 2 MG/2ML IJ SOLN
INTRAMUSCULAR | Status: DC | PRN
  Administered 2020-05-20: 1 mg via INTRAVENOUS

## 2020-05-20 MED ORDER — SODIUM CHLORIDE 0.9 % IV SOLN: INTRAVENOUS | Status: DC

## 2020-05-20 MED ORDER — ASPIRIN EC 325 MG PO TBEC
DELAYED_RELEASE_TABLET | ORAL | Status: AC
  Filled 2020-05-20: qty 1

## 2020-05-20 MED ORDER — LIDOCAINE HCL (PF) 1 % IJ SOLN
INTRAMUSCULAR | Status: AC | PRN
  Administered 2020-05-20: 10 mL

## 2020-05-20 MED ORDER — CLOPIDOGREL BISULFATE 75 MG PO TABS: 75.0000 mg | ORAL_TABLET | ORAL | Status: DC

## 2020-05-20 MED ORDER — ASPIRIN 81 MG PO CHEW: 81.0000 mg | CHEWABLE_TABLET | Freq: Every day | ORAL | Status: DC

## 2020-05-20 MED ORDER — CLEVIDIPINE BUTYRATE 0.5 MG/ML IV EMUL
INTRAVENOUS | Status: DC | PRN
  Administered 2020-05-20: 2 mg/h via INTRAVENOUS

## 2020-05-20 MED ORDER — LISINOPRIL 20 MG PO TABS
40.0000 mg | ORAL_TABLET | Freq: Every day | ORAL | Status: DC
  Administered 2020-05-21: 40 mg via ORAL
  Filled 2020-05-20: qty 2

## 2020-05-20 MED ORDER — ORAL CARE MOUTH RINSE: 15.0000 mL | Freq: Once | OROMUCOSAL | Status: AC

## 2020-05-20 MED ORDER — IOHEXOL 300 MG/ML  SOLN
150.0000 mL | Freq: Once | INTRAMUSCULAR | Status: AC | PRN
  Administered 2020-05-20: 75 mL via INTRA_ARTERIAL

## 2020-05-20 MED ORDER — CLEVIDIPINE BUTYRATE 0.5 MG/ML IV EMUL
INTRAVENOUS | Status: AC
  Filled 2020-05-20: qty 50

## 2020-05-20 MED ORDER — ATORVASTATIN CALCIUM 80 MG PO TABS
80.0000 mg | ORAL_TABLET | Freq: Every day | ORAL | Status: DC
  Administered 2020-05-21: 80 mg via ORAL
  Filled 2020-05-20: qty 1

## 2020-05-20 MED ORDER — ASPIRIN EC 325 MG PO TBEC: 325.0000 mg | DELAYED_RELEASE_TABLET | ORAL | Status: DC

## 2020-05-20 MED ORDER — CEFAZOLIN SODIUM-DEXTROSE 2-4 GM/100ML-% IV SOLN
2.0000 g | INTRAVENOUS | Status: AC
  Administered 2020-05-20: 2 g via INTRAVENOUS
  Filled 2020-05-20: qty 100

## 2020-05-20 MED ORDER — PROPOFOL 10 MG/ML IV BOLUS
INTRAVENOUS | Status: AC
  Filled 2020-05-20: qty 20

## 2020-05-20 MED ORDER — NITROGLYCERIN 1 MG/10 ML FOR IR/CATH LAB
INTRA_ARTERIAL | Status: AC
  Filled 2020-05-20: qty 10

## 2020-05-20 MED ORDER — LACTATED RINGERS IV SOLN: INTRAVENOUS | Status: DC

## 2020-05-20 MED ORDER — CLOPIDOGREL BISULFATE 75 MG PO TABS
ORAL_TABLET | ORAL | Status: AC
  Filled 2020-05-20: qty 1

## 2020-05-20 MED ORDER — CLOPIDOGREL BISULFATE 75 MG PO TABS
75.0000 mg | ORAL_TABLET | Freq: Every day | ORAL | Status: DC
  Administered 2020-05-21: 75 mg via ORAL
  Filled 2020-05-20: qty 1

## 2020-05-20 MED ORDER — PROTAMINE SULFATE 10 MG/ML IV SOLN
INTRAVENOUS | Status: DC | PRN
  Administered 2020-05-20: 5 mg via INTRAVENOUS

## 2020-05-20 MED ORDER — FENTANYL CITRATE (PF) 250 MCG/5ML IJ SOLN
INTRAMUSCULAR | Status: AC
  Filled 2020-05-20: qty 5

## 2020-05-20 MED ORDER — CEFAZOLIN SODIUM-DEXTROSE 2-4 GM/100ML-% IV SOLN
INTRAVENOUS | Status: AC
  Filled 2020-05-20: qty 100

## 2020-05-20 MED ORDER — IOHEXOL 300 MG/ML  SOLN
50.0000 mL | Freq: Once | INTRAMUSCULAR | Status: AC | PRN
  Administered 2020-05-20: 15 mL via INTRA_ARTERIAL

## 2020-05-20 MED ORDER — FENTANYL CITRATE (PF) 250 MCG/5ML IJ SOLN
INTRAMUSCULAR | Status: DC | PRN
  Administered 2020-05-20 (×4): 25 ug via INTRAVENOUS

## 2020-05-20 MED ORDER — DEXMEDETOMIDINE (PRECEDEX) IN NS 20 MCG/5ML (4 MCG/ML) IV SYRINGE
PREFILLED_SYRINGE | INTRAVENOUS | Status: DC | PRN
Start: 2020-05-20 — End: 2020-05-20
  Administered 2020-05-20 (×5): 4 ug via INTRAVENOUS

## 2020-05-20 MED ORDER — PROMETHAZINE HCL 25 MG/ML IJ SOLN: 6.2500 mg | INTRAMUSCULAR | Status: DC | PRN

## 2020-05-20 MED ORDER — HEPARIN (PORCINE) 25000 UT/250ML-% IV SOLN
700.0000 [IU]/h | INTRAVENOUS | Status: AC
  Filled 2020-05-20: qty 250

## 2020-05-20 MED ORDER — CLEVIDIPINE BUTYRATE 0.5 MG/ML IV EMUL
0.0000 mg/h | INTRAVENOUS | Status: AC
  Administered 2020-05-20: 6 mg/h via INTRAVENOUS
  Administered 2020-05-20: 12 mg/h via INTRAVENOUS
  Administered 2020-05-20: 4 mg/h via INTRAVENOUS
  Filled 2020-05-20 (×2): qty 50

## 2020-05-20 MED ORDER — ACETAMINOPHEN 650 MG RE SUPP: 650.0000 mg | RECTAL | Status: DC | PRN

## 2020-05-20 MED ORDER — ACETAMINOPHEN 10 MG/ML IV SOLN: 1000.0000 mg | Freq: Once | INTRAVENOUS | Status: DC | PRN

## 2020-05-20 MED ORDER — CHLORHEXIDINE GLUCONATE 0.12 % MT SOLN
OROMUCOSAL | Status: AC
  Administered 2020-05-20: 15 mL via OROMUCOSAL
  Filled 2020-05-20: qty 15

## 2020-05-20 MED ORDER — CHLORHEXIDINE GLUCONATE CLOTH 2 % EX PADS
6.0000 | MEDICATED_PAD | Freq: Every day | CUTANEOUS | Status: DC
  Administered 2020-05-20: 6 via TOPICAL

## 2020-05-20 MED ORDER — NIMODIPINE 30 MG PO CAPS: 0.0000 mg | ORAL_CAPSULE | ORAL | Status: DC

## 2020-05-20 MED ORDER — FENTANYL CITRATE (PF) 100 MCG/2ML IJ SOLN: 25.0000 ug | INTRAMUSCULAR | Status: DC | PRN

## 2020-05-20 MED ORDER — HEPARIN (PORCINE) 25000 UT/250ML-% IV SOLN
700.0000 [IU]/h | INTRAVENOUS | Status: DC
  Filled 2020-05-20: qty 250

## 2020-05-20 MED ORDER — TRAZODONE HCL 50 MG PO TABS: 50.0000 mg | ORAL_TABLET | Freq: Every evening | ORAL | Status: DC | PRN

## 2020-05-20 MED ORDER — ESMOLOL HCL 100 MG/10ML IV SOLN
INTRAVENOUS | Status: DC | PRN
  Administered 2020-05-20: 20 mg via INTRAVENOUS

## 2020-05-20 MED ORDER — ACETAMINOPHEN 325 MG PO TABS: 650.0000 mg | ORAL_TABLET | ORAL | Status: DC | PRN

## 2020-05-20 MED ORDER — MIDAZOLAM HCL 2 MG/2ML IJ SOLN
INTRAMUSCULAR | Status: AC
  Filled 2020-05-20: qty 2

## 2020-05-20 MED ORDER — GLYCOPYRROLATE PF 0.2 MG/ML IJ SOSY
PREFILLED_SYRINGE | INTRAMUSCULAR | Status: DC | PRN
  Administered 2020-05-20: .2 mg via INTRAVENOUS

## 2020-05-20 MED ORDER — ACETAMINOPHEN 160 MG/5ML PO SOLN: 650.0000 mg | ORAL | Status: DC | PRN

## 2020-05-20 MED ORDER — ASPIRIN 81 MG PO CHEW
81.0000 mg | CHEWABLE_TABLET | Freq: Every day | ORAL | Status: DC
  Administered 2020-05-21: 81 mg via ORAL
  Filled 2020-05-20: qty 1

## 2020-05-20 NOTE — Progress Notes (Signed)
ANTICOAGULATION CONSULT NOTE - Initial Consult  Pharmacy Consult for heparin Indication: Post neuro-IR intervention  Allergies  Allergen Reactions  . Ciprodex [Ciprofloxacin-Dexamethasone] Swelling    Ear canal swelling  . Metformin And Related     Upset stomach    Patient Measurements: Height: 5\' 10"  (177.8 cm) Weight: 85.6 kg (188 lb 11.4 oz) IBW/kg (Calculated) : 73 Heparin Dosing Weight: 85 kg   Vital Signs: Temp: 97.6 F (36.4 C) (05/02 1152) Temp Source: Oral (05/02 0500) BP: 120/74 (05/02 1207) Pulse Rate: 123 (05/02 1207)  Labs: No results for input(s): HGB, HCT, PLT, APTT, LABPROT, INR, HEPARINUNFRC, HEPRLOWMOCWT, CREATININE, CKTOTAL, CKMB, TROPONINIHS in the last 72 hours.  Estimated Creatinine Clearance: 62.1 mL/min (by C-G formula based on SCr of 1.16 mg/dL).   Medical History: Past Medical History:  Diagnosis Date  . Hypertension   . Myocardial infarction (HCC)    1990's  . Stroke [facility]) 03/15/2020    Medications:  Medications Prior to Admission  Medication Sig Dispense Refill Last Dose  . aspirin 325 MG tablet Take 325 mg by mouth daily.   05/20/2020 at Unknown time  . atorvastatin (LIPITOR) 80 MG tablet Take 1 tablet (80 mg total) by mouth daily. 30 tablet 0 05/20/2020 at Unknown time  . b complex vitamins capsule Take 1 capsule by mouth daily.   Past Week at Unknown time  . clopidogrel (PLAVIX) 75 MG tablet Take 1 tablet (75 mg total) by mouth daily. 30 tablet 2 05/20/2020 at Unknown time  . hydrochlorothiazide (HYDRODIURIL) 25 MG tablet Take 1 tablet (25 mg total) by mouth daily.   05/19/2020 at Unknown time  . lisinopril (ZESTRIL) 40 MG tablet Take 40 mg by mouth daily.   05/19/2020 at Unknown time  . potassium chloride SA (KLOR-CON) 20 MEQ tablet Take 1 tablet (20 mEq total) by mouth daily.   05/19/2020 at Unknown time  . VITAMIN D-VITAMIN K PO Take 1 capsule by mouth daily.   Past Week at Unknown time  . acetaminophen (TYLENOL) 500 MG tablet Take 1,000 mg by  mouth every 6 (six) hours as needed for moderate pain or headache.   Unknown at Unknown time  . traZODone (DESYREL) 50 MG tablet Take 50 mg by mouth at bedtime as needed for sleep.   Unknown at Unknown time    Assessment: 20 YOM with symptomatic proximal ICAS stenosis s/p revascularization with stent assisted angioplasty. Post CT with no ICH per radiology. Pharmacy consulted to assist with dosing IV heparin.  Goal of Therapy:  Heparin level 0.1-0.25 units/ml Monitor platelets by anticoagulation protocol: Yes   Plan:  -Start heparin 700 units/hr with plans to turn off heparin at 0800 tomorrow  -F/u 6 hr HL and monitor for s/s of bleeding   03-11-1974, PharmD., BCPS, BCCCP Clinical Pharmacist Please refer to [facility] for unit-specific pharmacist

## 2020-05-20 NOTE — Procedures (Signed)
S/P Lt common carotid arteriogram followed by revascularization of of symptomatic prox Lt ICAS stenosis with stent assisted angioplasty with distal protection . Post CT No ICH .  Patient denoies any H/S.N/V  Visual or motor symptoms. No change in the mod aphasia . Communicates appropriately. Pupils 24mm Rt =Lt EOMS full  No facial asymmetry. Tongue midline. Motor  nearlly equal in all 4s. No pronation drift. RT groin  Small to mod  Sized  hematoma pressed out manually.  Distal pulses dopplerable unchanged. Plan. [place] RT groin to R/O pseudo aneurysm Rt CFA . S.Katori Wirsing MD

## 2020-05-20 NOTE — Anesthesia Procedure Notes (Signed)
Arterial Line Insertion Start/End5/02/2020 8:30 AM, 05/20/2020 8:40 AM Performed by: Alvera Novel, CRNA, CRNA  Patient location: Pre-op. Preanesthetic checklist: patient identified, IV checked, site marked, risks and benefits discussed, surgical consent, monitors and equipment checked, pre-op evaluation, timeout performed and anesthesia consent Lidocaine 1% used for infiltration Left, radial was placed Catheter size: 20 Fr Hand hygiene performed  and maximum sterile barriers used   Attempts: 2 Procedure performed without using ultrasound guided technique. Following insertion, dressing applied and Biopatch. Post procedure assessment: normal and unchanged  Patient tolerated the procedure well with no immediate complications. Additional procedure comments: Could not thread last end of wire on first attempt. Went higher with no issues.Marland Kitchen

## 2020-05-20 NOTE — Progress Notes (Signed)
Vascular site tegaderm unable to stick after ultrasound. New gauze and tegaderm placed over quickclot.

## 2020-05-20 NOTE — Progress Notes (Signed)
NIR.  Patient underwent an image-guided cerebral arteriogram with revascularization of proximal left ICA stenosis using stent assisted angioplasty via right femoral approach this AM by Dr. Corliss Skains.  Patient evaluated bedside alongside Dr. Corliss Skains following procedure. Patient awake and alert laying in bed watching TV. Complains of back pain, chronic. Denies headache, N/V.  VAS [place] right groin today: 1. Appearance of residual neck arising from the common femoral artery. The neck measures approximately 0.5 cm wide and 1.5 cm long. No evidence of hematoma or pseudoaneurysm.   Alert, awake, and oriented x3. Follows simple commands. With mild aphasia (same as baseline). PERRL bilaterally. No facial asymmetry. Tongue midline. Can spontaneously move all extremities. No pronator drift. Fine motor and coordination intact and symmetric. Distal pulses (DPs, PTs) dopplerable bilaterally.  Plan to stay in neuro ICU for overnight observation. Raise HOB at 1800. Ok for knee immobilizer per Dr. Corliss Skains. Heparin gtt until 0700 tomorrow. Advance diet as tolerated. Ok to remove neck collar 0700 tomorrow. NIR to follow.   Waylan Boga Kynzlie Hilleary, PA-C 05/20/2020, 4:06 PM

## 2020-05-20 NOTE — Transfer of Care (Signed)
Immediate Anesthesia Transfer of Care Note  Patient: [name]  Procedure(s) Performed: RADIOLOGY WITH ANESTHESIA  LEFT ICA STENTING (Left )  Patient Location: PACU  Anesthesia Type:MAC  Level of Consciousness: awake, alert  and oriented  Airway & Oxygen Therapy: Patient Spontanous Breathing and Patient connected to nasal cannula oxygen  Post-op Assessment: Report given to RN and Post -op Vital signs reviewed and stable  Post vital signs: Reviewed and stable  Last Vitals:  Vitals Value Taken Time  BP 121/75 05/20/20 1152  Temp 36.4 C 05/20/20 1152  Pulse 121 05/20/20 1203  Resp 14 05/20/20 1203  SpO2 94 % 05/20/20 1203  Vitals shown include unvalidated device data.  Last Pain:  Vitals:   05/20/20 0719  PainSc: 0-No pain      Patients Stated Pain Goal: 0 (38/75/64 3329)  Complications: No complications documented.

## 2020-05-20 NOTE — Anesthesia Postprocedure Evaluation (Signed)
Anesthesia Post Note  Patient: [name]  Procedure(s) Performed: RADIOLOGY WITH ANESTHESIA  LEFT ICA STENTING (Left )     Patient location during evaluation: PACU Anesthesia Type: MAC Level of consciousness: awake Pain management: pain level controlled Vital Signs Assessment: post-procedure vital signs reviewed and stable Respiratory status: spontaneous breathing, nonlabored ventilation, respiratory function stable and patient connected to nasal cannula oxygen Cardiovascular status: stable and blood pressure returned to baseline Postop Assessment: no apparent nausea or vomiting Anesthetic complications: no   No complications documented.  Last Vitals:  Vitals:   05/20/20 1330 05/20/20 1345  BP:    Pulse: 86 75  Resp: 12 16  Temp:    SpO2: 93% 95%    Last Pain:  Vitals:   05/20/20 1222  PainSc: 0-No pain                 Messiah Ahr P Kimyatta Lecy

## 2020-05-20 NOTE — Sedation Documentation (Signed)
Right groin sheath removed. Quick clot applied 

## 2020-05-20 NOTE — Anesthesia Procedure Notes (Signed)
Procedure Name: [place] Performed by: Bryson Corona, CRNA Pre-anesthesia Checklist: Patient identified, Emergency Drugs available, Suction available, Patient being monitored and Timeout performed Patient Re-evaluated:Patient Re-evaluated prior to induction Oxygen Delivery Method: Nasal cannula

## 2020-05-20 NOTE — Progress Notes (Signed)
Groin pseudoaneurysm check study completed.  Preliminary results discussed with [name], RN.   See CV Proc for preliminary results report.   Jean Rosenthal, RDMS, RVT

## 2020-05-20 NOTE — Sedation Documentation (Signed)
Soft neck collar applied

## 2020-05-20 NOTE — H&P (Addendum)
Chief Complaint Patient was seen in consultation today for Cerebral arteriogram with possible left internal carotid artery angioplasty/stent placement at the request of Dr Thana Farr   Supervising Physician: Julieanne Cotton  Patient Status: [facility] - Out-pt  History of Present Illness: [name] is a 70 y.o. 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 Aortic atherosclerosis. 2. Atherosclerotic disease at both carotid bifurcations. No measurable stenosis on the right. 60% stenosis of the distal bulb on the left. 3. 30% stenosis of both vertebral artery origins. 50% stenosis of the right V4 segment. Severe stenosis of the left vertebral artery just beyond PICA, but with some antegrade flow. 4. 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 Stenoses in the right posterior cerebral artery, moderate at the P2 P3 junction. 5. 30% stenosis of the distal right M1 segment. 50% stenosis of the left M1 segment.  Referred to Dr Corliss Skains- note 03/26/20: Right-sided homonymous hemianopsia. Complaint still of memory issues and word finding Denies headache; N/V Denies vision changes Denies dizziness; or gait instability Denies numbness; tingling  Recommended Cerebral arteriogram Procedure 05/03/20: IMPRESSION: Severe approximately 85% plus stenosis of the proximal left internal carotid artery secondary to a circumferential atherosclerotic plaque. Approximately 50% stenosis of the left middle cerebral artery mid M1 segment. 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 Approximately 50% stenosis of the right posterior cerebral artery P1 segment, and 75% stenosis at the P2 P3 junction. Occluded left vertebrobasilar junction just distal to the origin of the left  posterior-inferior cerebellar artery.  Planned id for pt to undergo L ICA angio[plasty/stent placement in IR today He takes ASA 325 mg and Plavix 75 mg daily  P2y12:  21 4/28 Pending today   Past Medical History:  Diagnosis Date  . Hypertension   . Myocardial infarction (HCC)    1990's  . Stroke [facility]) 03/15/2020    Past Surgical History:  Procedure Laterality Date  . IR ANGIO INTRA EXTRACRAN SEL COM CAROTID INNOMINATE BILAT MOD SED  05/02/2020  . IR ANGIO VERTEBRAL SEL SUBCLAVIAN INNOMINATE UNI L MOD SED  05/02/2020  . IR ANGIO VERTEBRAL SEL VERTEBRAL UNI R MOD SED  05/02/2020  . IR RADIOLOGIST EVAL & MGMT  03/27/2020  . IR US GUIDE VASC ACCESS RIGHT  05/02/2020    Allergies: Ciprodex [ciprofloxacin-dexamethasone] and Metformin and related  Medications: Prior to Admission medications   Medication Sig Start Date End Date Taking? Authorizing Provider  acetaminophen (TYLENOL) 500 MG tablet Take 1,000 mg by mouth every 6 (six) hours as needed for moderate pain or headache.   Yes [provider]  aspirin 325 MG tablet Take 325 mg by mouth daily.   Yes [provider]  atorvastatin (LIPITOR) 80 MG tablet Take 1 tablet (80 mg total) by mouth daily. 03/17/20  Yes Azucena Fallen, MD  b complex vitamins capsule Take 1 capsule by mouth daily.   Yes [provider]  clopidogrel (PLAVIX) 75 MG tablet Take 1 tablet (75 mg total) by mouth daily. 03/17/20 06/15/20 Yes Pahwani, Daleen Bo, MD  hydrochlorothiazide (HYDRODIURIL) 25 MG tablet Take 1 tablet (25 mg total) by mouth daily. 03/18/20  Yes Pahwani, Daleen Bo, MD  lisinopril (ZESTRIL) 40 MG tablet Take 40 mg by mouth daily.   Yes [provider]  potassium chloride SA (KLOR-CON) 20  MEQ tablet Take 1 tablet (20 mEq total) by mouth daily. 03/22/20  Yes Azucena Fallen, MD  traZODone (DESYREL) 50 MG tablet Take 50 mg by mouth at bedtime as needed for sleep. 04/28/20  Yes [provider]  VITAMIN D-VITAMIN K  PO Take 1 capsule by mouth daily.   Yes [provider]     Family History  Problem Relation Age of Onset  . Heart attack Father   . Stroke Paternal Great-grandfather     Social History   Socioeconomic History  . Marital status: Single    Spouse name: Not on file  . Number of children: Not on file  . Years of education: Not on file  . Highest education level: Not on file  Occupational History  . Not on file  Tobacco Use  . Smoking status: Former Smoker    Types: Cigarettes    Quit date: 2003    Years since quitting: 19.3  . Smokeless tobacco: Never Used  Vaping Use  . Vaping Use: Never used  Substance and Sexual Activity  . Alcohol use: Yes    Comment: occasionally. Not since stroke in February  . Drug use: No  . Sexual activity: Not on file  Other Topics Concern  . Not on file  Social History Narrative   Lives with daughter (temporary after stroke)   Right handed   Drinks 1-2 cups caffeine daily   Social Determinants of Health   Financial Resource Strain: Not on file  Food Insecurity: Not on file  Transportation Needs: Not on file  Physical Activity: Not on file  Stress: Not on file  Social Connections: Not on file    Review of Systems: A 12 point ROS discussed and pertinent positives are indicated in the HPI above.  All other systems are negative.  Review of Systems  Constitutional: Positive for activity change. Negative for fatigue, fever and unexpected weight change.  HENT: Negative for tinnitus, trouble swallowing and voice change.   Eyes: Negative for visual disturbance.  Respiratory: Negative for cough and shortness of breath.   Cardiovascular: Negative for chest pain.  Gastrointestinal: Negative for abdominal pain, nausea and vomiting.  Musculoskeletal: Negative for gait problem.  Neurological: Positive for speech difficulty and headaches. Negative for dizziness, tremors, seizures, syncope, facial asymmetry, weakness, light-headedness and  numbness.  Psychiatric/Behavioral: Positive for confusion and decreased concentration. Negative for behavioral problems.    Vital Signs: There were no vitals taken for this visit.  Physical Exam Vitals reviewed.  HENT:     Mouth/Throat:     Mouth: Mucous membranes are moist.  Eyes:     Extraocular Movements: Extraocular movements intact.  Cardiovascular:     Rate and Rhythm: Normal rate and regular rhythm.     Heart sounds: Normal heart sounds.  Pulmonary:     Effort: Pulmonary effort is normal.     Breath sounds: Normal breath sounds.  Abdominal:     Palpations: Abdomen is soft.     Tenderness: There is no abdominal tenderness.  Musculoskeletal:        General: Normal range of motion.     Cervical back: Normal range of motion.     Right lower leg: No edema.     Left lower leg: No edema.  Skin:    General: Skin is warm.  Neurological:     Mental Status: He is alert. Mental status is at baseline.  Psychiatric:     Comments: obvious word finding issues Can say name;  dob Cannot tell me time or date or place  Daughter in room, Wynona Can[employer] surgeon) has signed consent      Imaging: IR US Guide Vasc Access Right  Result Date: 05/03/2020 CLINICAL DATA:  History of left cerebral hemispheric ischemic stroke secondary to severe proximal left ICA stenosis, seen on CT angiogram of the head and neck EXAM: BILATERAL COMMON CAROTID AND INNOMINATE ANGIOGRAPHY COMPARISON:  CT angiogram of the head and neck of February 25, 2020. MEDICATIONS: Heparin 2000 units IA. No antibiotic was administered within 1 hour of the procedure. ANESTHESIA/SEDATION: Versed 1 mg IV; Fentanyl 25 mcg IV Moderate Sedation Time:  47 minutes The patient was continuously monitored during the procedure by the interventional radiology nurse under my direct supervision. CONTRAST:  Isovue 300 approximately 65 mL FLUOROSCOPY TIME:  Fluoroscopy Time: 13 minutes 12 seconds (966 mGy). COMPLICATIONS: None immediate. TECHNIQUE:  Informed written consent was obtained from the patient after a thorough discussion of the procedural risks, benefits and alternatives. All questions were addressed. Maximal Sterile Barrier Technique was utilized including caps, mask, sterile gowns, sterile gloves, sterile drape, hand hygiene and skin antiseptic. A timeout was performed prior to the initiation of the procedure. The right forearm to the wrist was prepped and draped in the usual sterile manner. The right radial artery was identified with ultrasound, and its morphology documented. A dorsal palmar anastomosis was verified to be present. Using ultrasound guidance, right radial access was obtained over an 018 inch micro guidewire, followed by advancement of a 4/5 French radial sheath without event. The obturator, and the wire were removed. Good aspiration obtained from the side port of the radial sheath. A cocktail of 2000 units of heparin, and 200 mcg of nitroglycerin was then infused through the sheath without event. A right radial arteriogram was performed. A 5 French Simmons 2 diagnostic catheter was then advanced over a 0.035 inch Roadrunner guidewire to the aortic arch, and selectively positioned in the right vertebral artery, the right common carotid artery, the left common carotid artery and the left subclavian artery. At the end of the procedure, hemostasis at the right radial puncture site was achieved with a wrist band. A right radial pulse was verified to be present. FINDINGS: The innominate right vertebral artery demonstrates patency its origin. The vessel is seen to opacify to the cranial skull base. Patency is seen of the right vertebrobasilar junction and the right posterior-inferior cerebellar artery. The basilar artery demonstrates approximately 50% stenosis just distal to the origins of the anterior-inferior cerebellar arteries. There is occlusion of the left posterior cerebral artery and distal P1 segment. The right posterior cerebral  artery demonstrates 50% stenosis in the right P1 segment, and a 70-80% stenosis at the P2 P3 junction. Retrograde opacification of the left posterior-inferior cerebellar artery is seen from the right vertebral artery injection. The left common carotid arteriogram demonstrates the left external carotid artery to be mildly narrowed in its proximal aspect. Its branches, however, opacify normally. The left internal carotid artery just distal to the bulb has a severe tapered 85% stenosis. Distal to this the vessel opacifies widely to the cranial skull base. The petrous, cavernous and supraclinoid segments are widely patent. The left middle cerebral artery demonstrates approximately 50% stenosis in the mid M1 segment. The trifurcation branches opacify into the capillary and venous phases. The left anterior cerebral artery demonstrates patency into the capillary and venous phases. No evidence of left posterior communicating artery is seen. The right common carotid arteriogram demonstrates the right external  carotid artery and its major branches to be widely patent. The right internal carotid artery just distal to the bulb has a mild narrowing without evidence of intraluminal filling defects or ulcerations. More distally the vessel is seen to opacify normally to the cranial skull base. The petrous, cavernous and the supraclinoid segments are widely patent. The right middle cerebral artery and the right anterior cerebral artery opacify into the capillary and venous phases. Left vertebral artery origin is widely patent. The vessel demonstrates mild tortuosity proximally. There is slow ascent of contrast to the skull base with opacification of the left posterior-inferior cerebellar artery. 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 There is approximately 50% stenosis just proximal to the origin of the left posterior-inferior cerebellar  artery. 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 Approximately 50% stenosis of the left middle cerebral artery mid M1 segment. 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 Approximately 50% stenosis of the right posterior cerebral artery P1 segment, and 75% stenosis at the P2 P3 junction. 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 PLAN: Findings reviewed with patient and the daughter. Given the symptomatic nature of the left ICA stenosis, endovascular revascularization to prevent further ischemic events to the left anterior circulation was reviewed with the daughter and the patient. The procedure, the reason, alternatives were reviewed. Patient, and daughter would like to proceed with endovascular revascularization of the left internal carotid artery proximally. This will be scheduled as soon as possible. In the meantime, the patient was advised to take aspirin 325 mg a day in addition to the 75 mg of Plavix per day. A platelet function test will be performed at the time of anesthesia consultation. Patient was advised to maintain adequate hydration. They both leave with good understanding and agreement with the above management plan. Electronically Signed   By: Julieanne CottonSanjeev  Deveshwar M.D.   On: 05/02/2020 14:14   IR ANGIO INTRA EXTRACRAN SEL COM CAROTID INNOMINATE BILAT MOD SED  Result Date: 05/03/2020 CLINICAL DATA:  History of left cerebral hemispheric ischemic stroke secondary to severe proximal left ICA stenosis, seen on CT angiogram of the head and neck EXAM: BILATERAL COMMON CAROTID AND INNOMINATE ANGIOGRAPHY COMPARISON:  CT angiogram of the head and neck of February 25, 2020. MEDICATIONS: Heparin 2000 units IA. No antibiotic was administered within 1 hour of the procedure. ANESTHESIA/SEDATION: Versed 1 mg  IV; Fentanyl 25 mcg IV Moderate Sedation Time:  47 minutes The patient was continuously monitored during the procedure by the interventional radiology nurse under my direct supervision. CONTRAST:  Isovue 300 approximately 65 mL FLUOROSCOPY TIME:  Fluoroscopy Time: 13 minutes 12 seconds (966 mGy). COMPLICATIONS: None immediate. TECHNIQUE: Informed written consent was obtained from the patient after a thorough discussion of the procedural risks, benefits and alternatives. All questions were addressed. Maximal Sterile Barrier Technique was utilized including caps, mask, sterile gowns, sterile gloves, sterile drape, hand hygiene and skin antiseptic. A timeout was performed prior to the initiation of the procedure. The right forearm to the wrist was prepped and draped in the usual sterile manner. The right radial artery was identified with ultrasound, and its morphology documented. A dorsal palmar anastomosis was verified to be present. Using ultrasound guidance, right radial access was obtained over an 018 inch micro guidewire, followed by advancement of a 4/5 [place] radial sheath  without event. The obturator, and the wire were removed. Good aspiration obtained from the side port of the radial sheath. A cocktail of 2000 units of heparin, and 200 mcg of nitroglycerin was then infused through the sheath without event. A right radial arteriogram was performed. A 5 French Simmons 2 diagnostic catheter was then advanced over a 0.035 inch Roadrunner guidewire to the aortic arch, and selectively positioned in the right vertebral artery, the right common carotid artery, the left common carotid artery and the left subclavian artery. At the end of the procedure, hemostasis at the right radial puncture site was achieved with a wrist band. A right radial pulse was verified to be present. FINDINGS: The innominate right vertebral artery demonstrates patency its origin. The vessel is seen to opacify to the cranial skull base. Patency  is seen of the right vertebrobasilar junction and the right posterior-inferior cerebellar artery. The basilar artery demonstrates approximately 50% stenosis just distal to the origins of the anterior-inferior cerebellar arteries. There is occlusion of the left posterior cerebral artery and distal P1 segment. The right posterior cerebral artery demonstrates 50% stenosis in the right P1 segment, and a 70-80% stenosis at the P2 P3 junction. Retrograde opacification of the left posterior-inferior cerebellar artery is seen from the right vertebral artery injection. The left common carotid arteriogram demonstrates the left external carotid artery to be mildly narrowed in its proximal aspect. Its branches, however, opacify normally. The left internal carotid artery just distal to the bulb has a severe tapered 85% stenosis. Distal to this the vessel opacifies widely to the cranial skull base. The petrous, cavernous and supraclinoid segments are widely patent. The left middle cerebral artery demonstrates approximately 50% stenosis in the mid M1 segment. The trifurcation branches opacify into the capillary and venous phases. The left anterior cerebral artery demonstrates patency into the capillary and venous phases. No evidence of left posterior communicating artery is seen. The right common carotid arteriogram demonstrates the right external carotid artery and its major branches to be widely patent. The right internal carotid artery just distal to the bulb has a mild narrowing without evidence of intraluminal filling defects or ulcerations. More distally the vessel is seen to opacify normally to the cranial skull base. The petrous, cavernous and the supraclinoid segments are widely patent. The right middle cerebral artery and the right anterior cerebral artery opacify into the capillary and venous phases. Left vertebral artery origin is widely patent. The vessel demonstrates mild tortuosity proximally. There is slow ascent  of contrast to the skull base with opacification of the left posterior-inferior cerebellar artery. 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 There is approximately 50% stenosis just proximal to the origin of the left posterior-inferior cerebellar artery. 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 Approximately 50% stenosis of the left middle cerebral artery mid M1 segment. 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 Approximately 50% stenosis of the right posterior cerebral artery P1 segment, and 75% stenosis at the P2 P3 junction. 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 PLAN: Findings reviewed with patient and the daughter. Given the symptomatic nature of the left ICA stenosis, endovascular revascularization to prevent further ischemic events to the left anterior circulation was reviewed with the daughter and the patient. The procedure, the reason, alternatives were reviewed. Patient, and daughter would like  to proceed with endovascular revascularization of the left internal carotid artery proximally. This will be scheduled as soon as possible. In the meantime, the patient was advised to take aspirin 325 mg a day in addition to the 75 mg of Plavix per day. A platelet function test will be performed at the time of anesthesia consultation. Patient was advised to maintain adequate hydration. They both leave with good understanding and agreement with the above management plan. Electronically Signed   By: Julieanne Cotton M.D.   On: 05/02/2020 14:14   IR ANGIO VERTEBRAL SEL SUBCLAVIAN INNOMINATE UNI L MOD SED  Result Date: 05/03/2020 CLINICAL DATA:  History of left cerebral hemispheric ischemic stroke secondary to  severe proximal left ICA stenosis, seen on CT angiogram of the head and neck EXAM: BILATERAL COMMON CAROTID AND INNOMINATE ANGIOGRAPHY COMPARISON:  CT angiogram of the head and neck of February 25, 2020. MEDICATIONS: Heparin 2000 units IA. No antibiotic was administered within 1 hour of the procedure. ANESTHESIA/SEDATION: Versed 1 mg IV; Fentanyl 25 mcg IV Moderate Sedation Time:  47 minutes The patient was continuously monitored during the procedure by the interventional radiology nurse under my direct supervision. CONTRAST:  Isovue 300 approximately 65 mL FLUOROSCOPY TIME:  Fluoroscopy Time: 13 minutes 12 seconds (966 mGy). COMPLICATIONS: None immediate. TECHNIQUE: Informed written consent was obtained from the patient after a thorough discussion of the procedural risks, benefits and alternatives. All questions were addressed. Maximal Sterile Barrier Technique was utilized including caps, mask, sterile gowns, sterile gloves, sterile drape, hand hygiene and skin antiseptic. A timeout was performed prior to the initiation of the procedure. The right forearm to the wrist was prepped and draped in the usual sterile manner. The right radial artery was identified with ultrasound, and its morphology documented. A dorsal palmar anastomosis was verified to be present. Using ultrasound guidance, right radial access was obtained over an 018 inch micro guidewire, followed by advancement of a 4/5 French radial sheath without event. The obturator, and the wire were removed. Good aspiration obtained from the side port of the radial sheath. A cocktail of 2000 units of heparin, and 200 mcg of nitroglycerin was then infused through the sheath without event. A right radial arteriogram was performed. A 5 French Simmons 2 diagnostic catheter was then advanced over a 0.035 inch Roadrunner guidewire to the aortic arch, and selectively positioned in the right vertebral artery, the right common carotid artery, the left common carotid  artery and the left subclavian artery. At the end of the procedure, hemostasis at the right radial puncture site was achieved with a wrist band. A right radial pulse was verified to be present. FINDINGS: The innominate right vertebral artery demonstrates patency its origin. The vessel is seen to opacify to the cranial skull base. Patency is seen of the right vertebrobasilar junction and the right posterior-inferior cerebellar artery. The basilar artery demonstrates approximately 50% stenosis just distal to the origins of the anterior-inferior cerebellar arteries. There is occlusion of the left posterior cerebral artery and distal P1 segment. The right posterior cerebral artery demonstrates 50% stenosis in the right P1 segment, and a 70-80% stenosis at the P2 P3 junction. Retrograde opacification of the left posterior-inferior cerebellar artery is seen from the right vertebral artery injection. The left common carotid arteriogram demonstrates the left external carotid artery to be mildly narrowed in its proximal aspect. Its branches, however, opacify normally. The left internal carotid artery just distal to the bulb has a severe tapered 85% stenosis. Distal to this  the vessel opacifies widely to the cranial skull base. The petrous, cavernous and supraclinoid segments are widely patent. The left middle cerebral artery demonstrates approximately 50% stenosis in the mid M1 segment. The trifurcation branches opacify into the capillary and venous phases. The left anterior cerebral artery demonstrates patency into the capillary and venous phases. No evidence of left posterior communicating artery is seen. The right common carotid arteriogram demonstrates the right external carotid artery and its major branches to be widely patent. The right internal carotid artery just distal to the bulb has a mild narrowing without evidence of intraluminal filling defects or ulcerations. More distally the vessel is seen to opacify  normally to the cranial skull base. The petrous, cavernous and the supraclinoid segments are widely patent. The right middle cerebral artery and the right anterior cerebral artery opacify into the capillary and venous phases. Left vertebral artery origin is widely patent. The vessel demonstrates mild tortuosity proximally. There is slow ascent of contrast to the skull base with opacification of the left posterior-inferior cerebellar artery. 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 There is approximately 50% stenosis just proximal to the origin of the left posterior-inferior cerebellar artery. 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 Approximately 50% stenosis of the left middle cerebral artery mid M1 segment. 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 Approximately 50% stenosis of the right posterior cerebral artery P1 segment, and 75% stenosis at the P2 P3 junction. 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 PLAN: Findings reviewed with patient and the daughter. Given the symptomatic nature of the left ICA stenosis, endovascular revascularization to prevent further ischemic events to the left anterior circulation was reviewed with the daughter and the patient. The procedure, the reason, alternatives were reviewed. Patient, and daughter would like to proceed with endovascular revascularization of the left internal carotid artery proximally. This will be scheduled as soon as possible. In the meantime, the patient was advised to take aspirin 325 mg a day in addition to the 75 mg of Plavix per day. A platelet function test will be performed at the time of anesthesia consultation. Patient was advised to maintain adequate  hydration. They both leave with good understanding and agreement with the above management plan. Electronically Signed   By: Julieanne Cotton M.D.   On: 05/02/2020 14:14   IR ANGIO VERTEBRAL SEL VERTEBRAL UNI R MOD SED  Result Date: 05/03/2020 CLINICAL DATA:  History of left cerebral hemispheric ischemic stroke secondary to severe proximal left ICA stenosis, seen on CT angiogram of the head and neck EXAM: BILATERAL COMMON CAROTID AND INNOMINATE ANGIOGRAPHY COMPARISON:  CT angiogram of the head and neck of February 25, 2020. MEDICATIONS: Heparin 2000 units IA. No antibiotic was administered within 1 hour of the procedure. ANESTHESIA/SEDATION: Versed 1 mg IV; Fentanyl 25 mcg IV Moderate Sedation Time:  47 minutes The patient was continuously monitored during the procedure by the interventional radiology nurse under my direct supervision. CONTRAST:  Isovue 300 approximately 65 mL FLUOROSCOPY TIME:  Fluoroscopy Time: 13 minutes 12 seconds (966 mGy). COMPLICATIONS: None immediate. TECHNIQUE: Informed written consent was obtained from the patient after a thorough discussion of the procedural risks, benefits and alternatives. All questions were addressed. Maximal Sterile Barrier Technique was utilized including caps, mask, sterile gowns, sterile gloves, sterile drape, hand hygiene and skin  antiseptic. A timeout was performed prior to the initiation of the procedure. The right forearm to the wrist was prepped and draped in the usual sterile manner. The right radial artery was identified with ultrasound, and its morphology documented. A dorsal palmar anastomosis was verified to be present. Using ultrasound guidance, right radial access was obtained over an 018 inch micro guidewire, followed by advancement of a 4/5 French radial sheath without event. The obturator, and the wire were removed. Good aspiration obtained from the side port of the radial sheath. A cocktail of 2000 units of heparin, and 200 mcg of nitroglycerin  was then infused through the sheath without event. A right radial arteriogram was performed. A 5 French Simmons 2 diagnostic catheter was then advanced over a 0.035 inch Roadrunner guidewire to the aortic arch, and selectively positioned in the right vertebral artery, the right common carotid artery, the left common carotid artery and the left subclavian artery. At the end of the procedure, hemostasis at the right radial puncture site was achieved with a wrist band. A right radial pulse was verified to be present. FINDINGS: The innominate right vertebral artery demonstrates patency its origin. The vessel is seen to opacify to the cranial skull base. Patency is seen of the right vertebrobasilar junction and the right posterior-inferior cerebellar artery. The basilar artery demonstrates approximately 50% stenosis just distal to the origins of the anterior-inferior cerebellar arteries. There is occlusion of the left posterior cerebral artery and distal P1 segment. The right posterior cerebral artery demonstrates 50% stenosis in the right P1 segment, and a 70-80% stenosis at the P2 P3 junction. Retrograde opacification of the left posterior-inferior cerebellar artery is seen from the right vertebral artery injection. The left common carotid arteriogram demonstrates the left external carotid artery to be mildly narrowed in its proximal aspect. Its branches, however, opacify normally. The left internal carotid artery just distal to the bulb has a severe tapered 85% stenosis. Distal to this the vessel opacifies widely to the cranial skull base. The petrous, cavernous and supraclinoid segments are widely patent. The left middle cerebral artery demonstrates approximately 50% stenosis in the mid M1 segment. The trifurcation branches opacify into the capillary and venous phases. The left anterior cerebral artery demonstrates patency into the capillary and venous phases. No evidence of left posterior communicating artery is  seen. The right common carotid arteriogram demonstrates the right external carotid artery and its major branches to be widely patent. The right internal carotid artery just distal to the bulb has a mild narrowing without evidence of intraluminal filling defects or ulcerations. More distally the vessel is seen to opacify normally to the cranial skull base. The petrous, cavernous and the supraclinoid segments are widely patent. The right middle cerebral artery and the right anterior cerebral artery opacify into the capillary and venous phases. Left vertebral artery origin is widely patent. The vessel demonstrates mild tortuosity proximally. There is slow ascent of contrast to the skull base with opacification of the left posterior-inferior cerebellar artery. 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 There is approximately 50% stenosis just proximal to the origin of the left posterior-inferior cerebellar artery. 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 Approximately 50% stenosis of the left middle cerebral artery mid M1 segment. 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 Approximately 50% stenosis of the right posterior cerebral artery P1 segment, and  75% stenosis at the P2 P3 junction. 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 PLAN: Findings reviewed with patient and the daughter. Given the symptomatic nature of the left ICA stenosis, endovascular revascularization to prevent further ischemic events to the left anterior circulation was reviewed with the daughter and the patient. The procedure, the reason, alternatives were reviewed. Patient, and daughter would like to proceed with endovascular revascularization of the  left internal carotid artery proximally. This will be scheduled as soon as possible. In the meantime, the patient was advised to take aspirin 325 mg a day in addition to the 75 mg of Plavix per day. A platelet function test will be performed at the time of anesthesia consultation. Patient was advised to maintain adequate hydration. They both leave with good understanding and agreement with the above management plan. Electronically Signed   By: Julieanne Cotton M.D.   On: 05/02/2020 14:14    Labs:  CBC: Recent Labs    03/15/20 1902 03/15/20 1913 04/18/20 0932 05/02/20 0650 05/16/20 1413  WBC 8.8  --  8.2 6.0 6.2  HGB 17.9* 18.0* 14.6 12.9* 13.1  HCT 52.4* 53.0* 45.6 38.5* 39.5  PLT 297  --  300 245 273    COAGS: Recent Labs    03/15/20 1902 05/02/20 0650 05/16/20 1413  INR 0.9 1.0 0.9  APTT 33  --   --     BMP: Recent Labs    03/15/20 1902 03/15/20 1913 05/16/20 1413  NA 135 137 135  K 3.8 4.0 4.2  CL 94* 96* 98  CO2 29  --  31  GLUCOSE 137* 141* 117*  BUN 14 14 24*  CALCIUM 10.9*  --  10.5*  CREATININE 0.92 0.90 1.16  GFRNONAA >60  --  >60    LIVER FUNCTION TESTS: Recent Labs    03/15/20 1902  BILITOT 1.4*  AST 24  ALT 25  ALKPHOS 68  PROT 8.8*  ALBUMIN 5.0    TUMOR MARKERS: No results for input(s): AFPTM, CEA, CA199, CHROMGRNA in the last 8760 hours.  Assessment and Plan:  CVA 03/15/20 L ICA stenosis; residual word finding issues; confusion  Scheduled for Left internal carotid artery revascularization- angioplasty/stent placement Risks and benefits of cerebral angiogram with intervention were discussed with the patient including, but not limited to bleeding, infection, vascular injury, contrast induced renal failure, stroke or even death.  This interventional procedure involves the use of X-rays and because of the nature of the planned procedure, it is possible that we will have prolonged use of X-ray fluoroscopy.  Potential radiation risks to  you include (but are not limited to) the following: - A slightly elevated risk for cancer  several years later in life. This risk is typically less than 0.5% percent. This risk is low in comparison to the normal incidence of human cancer, which is 33% for women and 50% for men according to the [employer]. - Radiation induced injury can include skin redness, resembling a rash, tissue breakdown / ulcers and hair loss (which can be temporary or permanent).   The likelihood of either of these occurring depends on the difficulty of the procedure and whether you are sensitive to radiation due to previous procedures, disease, or genetic conditions.   IF your procedure requires a prolonged use of radiation, you will be notified and given written instructions for further action.  It is your responsibility to monitor the irradiated area for the 2 weeks  following the procedure and to notify your physician if you are concerned that you have suffered a radiation induced injury.    All of the patient's questions were answered, patient is agreeable to proceed. Dtr Wynona Canes at bedside- also signed consent--she is agreeable to proceed She understands if intervention is performed; pt will be admitted to Neuro ICU overnight for observation- plan to DC next am if stable.  Consent signed and in chart.  Thank you for this interesting consult.  I greatly enjoyed meeting [name] and look forward to participating in their care.  A copy of this report was sent to the requesting provider on this date.  Electronically Signed: Robet Leu, PA-C 05/20/2020, 7:15 AM   I spent a total of  40 Minutes   in face to face in clinical consultation, greater than 50% of which was counseling/coordinating care for L ICA revascularization

## 2020-05-20 NOTE — Progress Notes (Addendum)
ANTICOAGULATION CONSULT NOTE  Pharmacy Consult for heparin Indication: Post neuro-IR intervention  Allergies  Allergen Reactions  . Ciprodex [Ciprofloxacin-Dexamethasone] Swelling    Ear canal swelling  . Metformin And Related     Upset stomach    Patient Measurements: Height: 5\' 10"  (177.8 cm) Weight: 85.6 kg (188 lb 11.4 oz) IBW/kg (Calculated) : 73 Heparin Dosing Weight: 85 kg   Vital Signs: Temp: 97.9 F (36.6 C) (05/02 2000) Temp Source: Oral (05/02 2000) BP: 107/78 (05/02 1900) Pulse Rate: 112 (05/02 1900)  Labs: Recent Labs    05/20/20 2000  HEPARINUNFRC 0.17*    Estimated Creatinine Clearance: 62.1 mL/min (by C-G formula based on SCr of 1.16 mg/dL).   Medical History: Past Medical History:  Diagnosis Date  . Hypertension   . Myocardial infarction (HCC)    1990's  . Stroke [facility]) 03/15/2020    Medications:  Medications Prior to Admission  Medication Sig Dispense Refill Last Dose  . aspirin 325 MG tablet Take 325 mg by mouth daily.   05/20/2020 at Unknown time  . atorvastatin (LIPITOR) 80 MG tablet Take 1 tablet (80 mg total) by mouth daily. 30 tablet 0 05/20/2020 at Unknown time  . b complex vitamins capsule Take 1 capsule by mouth daily.   Past Week at Unknown time  . clopidogrel (PLAVIX) 75 MG tablet Take 1 tablet (75 mg total) by mouth daily. 30 tablet 2 05/20/2020 at Unknown time  . hydrochlorothiazide (HYDRODIURIL) 25 MG tablet Take 1 tablet (25 mg total) by mouth daily.   05/19/2020 at Unknown time  . lisinopril (ZESTRIL) 40 MG tablet Take 40 mg by mouth daily.   05/19/2020 at Unknown time  . potassium chloride SA (KLOR-CON) 20 MEQ tablet Take 1 tablet (20 mEq total) by mouth daily.   05/19/2020 at Unknown time  . VITAMIN D-VITAMIN K PO Take 1 capsule by mouth daily.   Past Week at Unknown time  . acetaminophen (TYLENOL) 500 MG tablet Take 1,000 mg by mouth every 6 (six) hours as needed for moderate pain or headache.   Unknown at Unknown time  . traZODone  (DESYREL) 50 MG tablet Take 50 mg by mouth at bedtime as needed for sleep.   Unknown at Unknown time    Assessment: 67 YOM with symptomatic proximal ICAS stenosis s/p revascularization with stent assisted angioplasty. Post CT with no ICH per radiology. Pharmacy consulted to assist with dosing IV heparin per post-neuro IR intervention protocol. Patient is not on anticoagulation PTA.  Heparin level therapeutic for lower goal at 0.17. Last CBC from 4/28 WNL, no repeat this admit yet. No active bleed issues reported.  Goal of Therapy:  Heparin level 0.1-0.25 units/ml Monitor platelets by anticoagulation protocol: Yes   Plan:  Continue heparin at 700 units/hr Monitor daily heparin level and CBC, s/sx bleeding Heparin off at 0800 on 5/3 per protocol   7/3, PharmD, BCPS Please check AMION for all [facility] contact numbers Clinical Pharmacist 05/20/2020 8:59 PM

## 2020-05-20 NOTE — Sedation Documentation (Signed)
Pt with hematoma to right groin. Pressure applied, area soft. Dr. Corliss Skains aware.

## 2020-05-20 NOTE — Progress Notes (Signed)
Orthopedic Tech Progress Note Patient Details:  [name] [birth year] [id number]  Ortho Devices Type of Ortho Device: Knee Immobilizer Ortho Device/Splint Location: RLE Ortho Device/Splint Interventions: Ordered,Application,Adjustment   Post Interventions Patient Tolerated: Well Instructions Provided: Care of device   Donald Pore 05/20/2020, 5:18 PM

## 2020-05-21 ENCOUNTER — Inpatient Hospital Stay (HOSPITAL_COMMUNITY): Payer: Medicare HMO

## 2020-05-21 ENCOUNTER — Encounter (HOSPITAL_COMMUNITY): Payer: Self-pay | Admitting: Interventional Radiology

## 2020-05-21 DIAGNOSIS — S7001XA Contusion of right hip, initial encounter: Secondary | ICD-10-CM | POA: Diagnosis not present

## 2020-05-21 LAB — BASIC METABOLIC PANEL
Anion gap: 7 (ref 5–15)
BUN: 11 mg/dL (ref 8–23)
CO2: 26 mmol/L (ref 22–32)
Calcium: 9.3 mg/dL (ref 8.9–10.3)
Chloride: 102 mmol/L (ref 98–111)
Creatinine, Ser: 0.87 mg/dL (ref 0.61–1.24)
GFR, Estimated: >60 mL/min (ref >60)
Glucose, Bld: 127 mg/dL — ABNORMAL HIGH (ref 70–99)
Potassium: 3.8 mmol/L (ref 3.5–5.1)
Sodium: 135 mmol/L (ref 135–145)

## 2020-05-21 LAB — CBC WITH DIFFERENTIAL/PLATELET
Abs Immature Granulocytes: 0.03 10*3/uL (ref 0.00–0.07)
Basophils Absolute: 0 10*3/uL (ref 0.0–0.1)
Basophils Relative: 1 %
Eosinophils Absolute: 0.1 10*3/uL (ref 0.0–0.5)
Eosinophils Relative: 2 %
HCT: 28.5 % — ABNORMAL LOW (ref 39.0–52.0)
Hemoglobin: 9.5 g/dL — ABNORMAL LOW (ref 13.0–17.0)
Immature Granulocytes: 1 %
Lymphocytes Relative: 19 %
Lymphs Abs: 1.2 10*3/uL (ref 0.7–4.0)
MCH: 32 pg (ref 26.0–34.0)
MCHC: 33.3 g/dL (ref 30.0–36.0)
MCV: 96 fL (ref 80.0–100.0)
Monocytes Absolute: 0.4 10*3/uL (ref 0.1–1.0)
Monocytes Relative: 7 %
Neutro Abs: 4.2 10*3/uL (ref 1.7–7.7)
Neutrophils Relative %: 70 %
Platelets: 201 10*3/uL (ref 150–400)
RBC: 2.97 MIL/uL — ABNORMAL LOW (ref 4.22–5.81)
RDW: 13.7 % (ref 11.5–15.5)
WBC: 6 10*3/uL (ref 4.0–10.5)
nRBC: 0 % (ref 0.0–0.2)

## 2020-05-21 MED ORDER — CLOPIDOGREL BISULFATE 75 MG PO TABS: 37.5000 mg | ORAL_TABLET | Freq: Every day | ORAL | 2 refills | Status: AC

## 2020-05-21 MED ORDER — ASPIRIN 81 MG PO CHEW: 81.0000 mg | CHEWABLE_TABLET | Freq: Every day | ORAL | 1 refills | Status: DC

## 2020-05-21 NOTE — Progress Notes (Signed)
Discharge instructions reviewed with patient and daughter Neysa Bonito). Patient practiced technique to stand and apply pressure to groin. Confirmed current bruising at groin site with daughter. All belongings accounted for and patient assisted to vehicle via wheelchair.

## 2020-05-21 NOTE — Discharge Summary (Signed)
Patient ID: [name] MRN: [id number] DOB/AGE: Apr 28, 1950 70 y.o.  Admit date: 05/20/2020 Discharge date: 05/21/2020  Supervising Physician: Julieanne Cotton  Patient Status: [facility] - In-pt  Admission Diagnoses: Internal carotid artery stenosis, left  Discharge Diagnoses:  Active Problems:   Internal carotid artery stenosis, left   Discharged Condition: stable  Hospital Course:  Patient presented to [facility] 05/20/2020 for an image-guided cerebral arteriogram with revascularization of proximal left ICA stenosis using stent assisted angioplasty via right femoral approach by Dr. Corliss Skains. Procedure occurred without major complications and patient was transferred to PACU following procedure in stable condition (VSS, right femoral puncture site stable). In PACU, patient noted to have oozing and ecchymosis of right femoral puncture site. VAS [place] right groin was obtained which revealed no evidence of pseudoaneurysm. He was then transferred to neuro ICU for overnight observation. No major events occurred overnight.  This AM, patient awake and alert sitting in bed with no complaints. Right femoral puncture site remains soft however with increased ecchymosis- therefore repeat VAS [place] right groin was obtained which again revealed no evidence of pseudoaneurysm. Plan to discharge home today (with restrictions/medication changes as below) and follow-up with Dr. Corliss Skains in clinic 2 weeks after discharge.  I spoke with patient's daughter, Moises Blood, via telephone this AM to discuss discharge restrictions/medication adjustments. All questions answered and concerns addressed.   Consults: None  Significant Diagnostic Studies: IR US Guide Vasc Access Right  Result Date: 05/03/2020 CLINICAL DATA:  History of left cerebral hemispheric ischemic stroke secondary to severe proximal left ICA stenosis, seen on CT angiogram of the head and neck EXAM: BILATERAL COMMON CAROTID AND INNOMINATE ANGIOGRAPHY  COMPARISON:  CT angiogram of the head and neck of February 25, 2020. MEDICATIONS: Heparin 2000 units IA. No antibiotic was administered within 1 hour of the procedure. ANESTHESIA/SEDATION: Versed 1 mg IV; Fentanyl 25 mcg IV Moderate Sedation Time:  47 minutes The patient was continuously monitored during the procedure by the interventional radiology nurse under my direct supervision. CONTRAST:  Isovue 300 approximately 65 mL FLUOROSCOPY TIME:  Fluoroscopy Time: 13 minutes 12 seconds (966 mGy). COMPLICATIONS: None immediate. TECHNIQUE: Informed written consent was obtained from the patient after a thorough discussion of the procedural risks, benefits and alternatives. All questions were addressed. Maximal Sterile Barrier Technique was utilized including caps, mask, sterile gowns, sterile gloves, sterile drape, hand hygiene and skin antiseptic. A timeout was performed prior to the initiation of the procedure. The right forearm to the wrist was prepped and draped in the usual sterile manner. The right radial artery was identified with ultrasound, and its morphology documented. A dorsal palmar anastomosis was verified to be present. Using ultrasound guidance, right radial access was obtained over an 018 inch micro guidewire, followed by advancement of a 4/5 French radial sheath without event. The obturator, and the wire were removed. Good aspiration obtained from the side port of the radial sheath. A cocktail of 2000 units of heparin, and 200 mcg of nitroglycerin was then infused through the sheath without event. A right radial arteriogram was performed. A 5 French Simmons 2 diagnostic catheter was then advanced over a 0.035 inch Roadrunner guidewire to the aortic arch, and selectively positioned in the right vertebral artery, the right common carotid artery, the left common carotid artery and the left subclavian artery. At the end of the procedure, hemostasis at the right radial puncture site was achieved with a wrist  band. A right radial pulse was verified to be present. FINDINGS: The  innominate right vertebral artery demonstrates patency its origin. The vessel is seen to opacify to the cranial skull base. Patency is seen of the right vertebrobasilar junction and the right posterior-inferior cerebellar artery. The basilar artery demonstrates approximately 50% stenosis just distal to the origins of the anterior-inferior cerebellar arteries. There is occlusion of the left posterior cerebral artery and distal P1 segment. The right posterior cerebral artery demonstrates 50% stenosis in the right P1 segment, and a 70-80% stenosis at the P2 P3 junction. Retrograde opacification of the left posterior-inferior cerebellar artery is seen from the right vertebral artery injection. The left common carotid arteriogram demonstrates the left external carotid artery to be mildly narrowed in its proximal aspect. Its branches, however, opacify normally. The left internal carotid artery just distal to the bulb has a severe tapered 85% stenosis. Distal to this the vessel opacifies widely to the cranial skull base. The petrous, cavernous and supraclinoid segments are widely patent. The left middle cerebral artery demonstrates approximately 50% stenosis in the mid M1 segment. The trifurcation branches opacify into the capillary and venous phases. The left anterior cerebral artery demonstrates patency into the capillary and venous phases. No evidence of left posterior communicating artery is seen. The right common carotid arteriogram demonstrates the right external carotid artery and its major branches to be widely patent. The right internal carotid artery just distal to the bulb has a mild narrowing without evidence of intraluminal filling defects or ulcerations. More distally the vessel is seen to opacify normally to the cranial skull base. The petrous, cavernous and the supraclinoid segments are widely patent. The right middle cerebral artery and  the right anterior cerebral artery opacify into the capillary and venous phases. Left vertebral artery origin is widely patent. The vessel demonstrates mild tortuosity proximally. There is slow ascent of contrast to the skull base with opacification of the left posterior-inferior cerebellar artery. 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 There is approximately 50% stenosis just proximal to the origin of the left posterior-inferior cerebellar artery. 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 Approximately 50% stenosis of the left middle cerebral artery mid M1 segment. 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 Approximately 50% stenosis of the right posterior cerebral artery P1 segment, and 75% stenosis at the P2 P3 junction. 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 PLAN: Findings reviewed with patient and the daughter. Given the symptomatic nature of the left ICA stenosis, endovascular revascularization to prevent further ischemic events to the left anterior circulation was reviewed with the daughter and the patient. The procedure, the reason, alternatives were reviewed. Patient, and daughter would like to proceed with endovascular revascularization of the left internal carotid artery proximally. This will be scheduled as soon as possible. In the meantime, the patient was advised to take aspirin 325 mg a day in addition to the 75 mg of Plavix per day. A platelet function test will be performed at the time of anesthesia consultation. Patient was advised to maintain adequate hydration. They both leave with good understanding and agreement with the above management plan. Electronically Signed   By: Julieanne Cotton  M.D.   On: 05/02/2020 14:14   VAS [place] GROIN PSEUDOANEURYSM  Result Date: 05/21/2020  ARTERIAL PSEUDOANEURYSM  Patient Name:  JASHAUN PENROSE  Date of Exam:   05/21/2020 Medical  Rec #: [id number]         Accession #:    [id number] Date of Birth: [birth year]        Patient Gender: M Patient Age:   69Y [facility] Procedure:      VAS US Bobetta LimeGROIN PSEUDOANEURYSM Referring Phys: [id number] Baird LyonsALEXANDRA M Juanantonio Stolar --------------------------------------------------------------------------------  Exam: Right groin History: S/p catheterization S/P left ICA stent via right femoral. Performing Technologist: Marilynne Halstedita Sturdivant RDMS, RVT  Examination Guidelines: A complete evaluation includes B-mode imaging, spectral Doppler, color Doppler, and power Doppler as needed of all accessible portions of each vessel. Bilateral testing is considered an integral part of a complete examination. Limited examinations for reoccurring indications may be performed as noted.  Findings: A mixed echogenic structure measuring approximately 1.0 cm x 1.3 cm is visualized at the Right groin with ultrasound characteristics of a hematoma. There is no significant change in this exam when compared to prior exam.  Summary: No evidence of a definitive pseudoaneurysm or AVF. A small hematoma was noted in the right groin.   --------------------------------------------------------------------------------    Preliminary    VAS US GROIN PSEUDOANEURYSM  Result Date: 05/20/2020  ARTERIAL PSEUDOANEURYSM  Patient Name:  Scotty CourtRANDALL D Norden  Date of Exam:   05/20/2020 Medical Rec #: [id number]         Accession #:    [id number] Date of Birth: [birth year]        Patient Gender: M Patient Age:   69Y [facility] Procedure:      VAS US Bobetta LimeGROIN PSEUDOANEURYSM Referring Phys: [id number] [facility] --------------------------------------------------------------------------------  Exam: Right groin Indications: Patient complains of  bruising, hematoma vs. pseudoaneurysm. History: S/p catheterization. Comparison Study: No prior studies. Performing Technologist: Jean Rosenthalachel Hodge RDMS,RVT  Examination Guidelines: A complete evaluation includes B-mode imaging, spectral Doppler, color Doppler, and power Doppler as needed of all accessible portions of each vessel. Bilateral testing is considered an integral part of a complete examination. Limited examinations for reoccurring indications may be performed as noted. +------------+----------+----------+------+----------+ Right DuplexPSV (cm/s) Waveform PlaqueComment(s) +------------+----------+----------+------+----------+ CFA             72    monophasic                 +------------+----------+----------+------+----------+ PFA             83    monophasic                 +------------+----------+----------+------+----------+ Prox SFA        66    monophasic                 +------------+----------+----------+------+----------+ Right Vein comments: Patent, WNL  Findings: Appearance of residual neck arising from the common femoral artery. The neck measures approximately 0.5 cm wide and 1.5 cm long. No evidence of hematoma or pseudoaneurysm.  Diagnosing physician: Fabienne Brunsharles Fields MD Electronically signed by Fabienne Brunsharles Fields MD on 05/20/2020 at 2:28:42 PM.   --------------------------------------------------------------------------------    Final    IR ANGIO INTRA EXTRACRAN SEL COM CAROTID INNOMINATE BILAT MOD SED  Result Date: 05/03/2020 CLINICAL DATA:  History of left cerebral hemispheric ischemic stroke secondary to severe proximal left ICA stenosis, seen on CT angiogram of the head and neck EXAM: BILATERAL COMMON CAROTID AND INNOMINATE ANGIOGRAPHY COMPARISON:  CT angiogram of the head and neck of February 25, 2020. MEDICATIONS: Heparin 2000 units IA. No antibiotic was administered within 1 hour of the procedure. ANESTHESIA/SEDATION: Versed 1 mg IV; Fentanyl  25 mcg IV Moderate Sedation  Time:  47 minutes The patient was continuously monitored during the procedure by the interventional radiology nurse under my direct supervision. CONTRAST:  Isovue 300 approximately 65 mL FLUOROSCOPY TIME:  Fluoroscopy Time: 13 minutes 12 seconds (966 mGy). COMPLICATIONS: None immediate. TECHNIQUE: Informed written consent was obtained from the patient after a thorough discussion of the procedural risks, benefits and alternatives. All questions were addressed. Maximal Sterile Barrier Technique was utilized including caps, mask, sterile gowns, sterile gloves, sterile drape, hand hygiene and skin antiseptic. A timeout was performed prior to the initiation of the procedure. The right forearm to the wrist was prepped and draped in the usual sterile manner. The right radial artery was identified with ultrasound, and its morphology documented. A dorsal palmar anastomosis was verified to be present. Using ultrasound guidance, right radial access was obtained over an 018 inch micro guidewire, followed by advancement of a 4/5 French radial sheath without event. The obturator, and the wire were removed. Good aspiration obtained from the side port of the radial sheath. A cocktail of 2000 units of heparin, and 200 mcg of nitroglycerin was then infused through the sheath without event. A right radial arteriogram was performed. A 5 French Simmons 2 diagnostic catheter was then advanced over a 0.035 inch Roadrunner guidewire to the aortic arch, and selectively positioned in the right vertebral artery, the right common carotid artery, the left common carotid artery and the left subclavian artery. At the end of the procedure, hemostasis at the right radial puncture site was achieved with a wrist band. A right radial pulse was verified to be present. FINDINGS: The innominate right vertebral artery demonstrates patency its origin. The vessel is seen to opacify to the cranial skull base. Patency is seen of the right vertebrobasilar  junction and the right posterior-inferior cerebellar artery. The basilar artery demonstrates approximately 50% stenosis just distal to the origins of the anterior-inferior cerebellar arteries. There is occlusion of the left posterior cerebral artery and distal P1 segment. The right posterior cerebral artery demonstrates 50% stenosis in the right P1 segment, and a 70-80% stenosis at the P2 P3 junction. Retrograde opacification of the left posterior-inferior cerebellar artery is seen from the right vertebral artery injection. The left common carotid arteriogram demonstrates the left external carotid artery to be mildly narrowed in its proximal aspect. Its branches, however, opacify normally. The left internal carotid artery just distal to the bulb has a severe tapered 85% stenosis. Distal to this the vessel opacifies widely to the cranial skull base. The petrous, cavernous and supraclinoid segments are widely patent. The left middle cerebral artery demonstrates approximately 50% stenosis in the mid M1 segment. The trifurcation branches opacify into the capillary and venous phases. The left anterior cerebral artery demonstrates patency into the capillary and venous phases. No evidence of left posterior communicating artery is seen. The right common carotid arteriogram demonstrates the right external carotid artery and its major branches to be widely patent. The right internal carotid artery just distal to the bulb has a mild narrowing without evidence of intraluminal filling defects or ulcerations. More distally the vessel is seen to opacify normally to the cranial skull base. The petrous, cavernous and the supraclinoid segments are widely patent. The right middle cerebral artery and the right anterior cerebral artery opacify into the capillary and venous phases. Left vertebral artery origin is widely patent. The vessel demonstrates mild tortuosity proximally. There is slow ascent of contrast to the skull base with  opacification of  the left posterior-inferior cerebellar artery. 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 There is approximately 50% stenosis just proximal to the origin of the left posterior-inferior cerebellar artery. 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 Approximately 50% stenosis of the left middle cerebral artery mid M1 segment. 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 Approximately 50% stenosis of the right posterior cerebral artery P1 segment, and 75% stenosis at the P2 P3 junction. 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 PLAN: Findings reviewed with patient and the daughter. Given the symptomatic nature of the left ICA stenosis, endovascular revascularization to prevent further ischemic events to the left anterior circulation was reviewed with the daughter and the patient. The procedure, the reason, alternatives were reviewed. Patient, and daughter would like to proceed with endovascular revascularization of the left internal carotid artery proximally. This will be scheduled as soon as possible. In the meantime, the patient was advised to take aspirin 325 mg a day in addition to the 75 mg of Plavix per day. A platelet function test will be performed at the time of anesthesia consultation. Patient was advised to maintain adequate hydration. They both leave with good understanding and agreement with the above management plan. Electronically Signed   By: Julieanne Cotton M.D.   On: 05/02/2020 14:14   IR ANGIO VERTEBRAL SEL SUBCLAVIAN INNOMINATE UNI L MOD SED  Result Date: 05/03/2020 CLINICAL DATA:  History of left cerebral hemispheric ischemic stroke secondary to severe proximal left ICA stenosis,  seen on CT angiogram of the head and neck EXAM: BILATERAL COMMON CAROTID AND INNOMINATE ANGIOGRAPHY COMPARISON:  CT angiogram of the head and neck of February 25, 2020. MEDICATIONS: Heparin 2000 units IA. No antibiotic was administered within 1 hour of the procedure. ANESTHESIA/SEDATION: Versed 1 mg IV; Fentanyl 25 mcg IV Moderate Sedation Time:  47 minutes The patient was continuously monitored during the procedure by the interventional radiology nurse under my direct supervision. CONTRAST:  Isovue 300 approximately 65 mL FLUOROSCOPY TIME:  Fluoroscopy Time: 13 minutes 12 seconds (966 mGy). COMPLICATIONS: None immediate. TECHNIQUE: Informed written consent was obtained from the patient after a thorough discussion of the procedural risks, benefits and alternatives. All questions were addressed. Maximal Sterile Barrier Technique was utilized including caps, mask, sterile gowns, sterile gloves, sterile drape, hand hygiene and skin antiseptic. A timeout was performed prior to the initiation of the procedure. The right forearm to the wrist was prepped and draped in the usual sterile manner. The right radial artery was identified with ultrasound, and its morphology documented. A dorsal palmar anastomosis was verified to be present. Using ultrasound guidance, right radial access was obtained over an 018 inch micro guidewire, followed by advancement of a 4/5 French radial sheath without event. The obturator, and the wire were removed. Good aspiration obtained from the side port of the radial sheath. A cocktail of 2000 units of heparin, and 200 mcg of nitroglycerin was then infused through the sheath without event. A right radial arteriogram was performed. A 5 French Simmons 2 diagnostic catheter was then advanced over a 0.035 inch Roadrunner guidewire to the aortic arch, and selectively positioned in the right vertebral artery, the right common carotid artery, the left common carotid artery and the left subclavian artery.  At the end of the procedure, hemostasis at the right radial puncture  site was achieved with a wrist band. A right radial pulse was verified to be present. FINDINGS: The innominate right vertebral artery demonstrates patency its origin. The vessel is seen to opacify to the cranial skull base. Patency is seen of the right vertebrobasilar junction and the right posterior-inferior cerebellar artery. The basilar artery demonstrates approximately 50% stenosis just distal to the origins of the anterior-inferior cerebellar arteries. There is occlusion of the left posterior cerebral artery and distal P1 segment. The right posterior cerebral artery demonstrates 50% stenosis in the right P1 segment, and a 70-80% stenosis at the P2 P3 junction. Retrograde opacification of the left posterior-inferior cerebellar artery is seen from the right vertebral artery injection. The left common carotid arteriogram demonstrates the left external carotid artery to be mildly narrowed in its proximal aspect. Its branches, however, opacify normally. The left internal carotid artery just distal to the bulb has a severe tapered 85% stenosis. Distal to this the vessel opacifies widely to the cranial skull base. The petrous, cavernous and supraclinoid segments are widely patent. The left middle cerebral artery demonstrates approximately 50% stenosis in the mid M1 segment. The trifurcation branches opacify into the capillary and venous phases. The left anterior cerebral artery demonstrates patency into the capillary and venous phases. No evidence of left posterior communicating artery is seen. The right common carotid arteriogram demonstrates the right external carotid artery and its major branches to be widely patent. The right internal carotid artery just distal to the bulb has a mild narrowing without evidence of intraluminal filling defects or ulcerations. More distally the vessel is seen to opacify normally to the cranial skull base. The  petrous, cavernous and the supraclinoid segments are widely patent. The right middle cerebral artery and the right anterior cerebral artery opacify into the capillary and venous phases. Left vertebral artery origin is widely patent. The vessel demonstrates mild tortuosity proximally. There is slow ascent of contrast to the skull base with opacification of the left posterior-inferior cerebellar artery. 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 There is approximately 50% stenosis just proximal to the origin of the left posterior-inferior cerebellar artery. 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 Approximately 50% stenosis of the left middle cerebral artery mid M1 segment. 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 Approximately 50% stenosis of the right posterior cerebral artery P1 segment, and 75% stenosis at the P2 P3 junction. 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 PLAN: Findings reviewed with patient and the daughter. Given the symptomatic nature of the left ICA stenosis, endovascular revascularization to prevent further ischemic events to the left anterior circulation was reviewed with the daughter and the patient. The procedure, the reason, alternatives were reviewed. Patient, and daughter would like to proceed with endovascular revascularization of the left internal carotid artery proximally. This will be scheduled as soon as possible. In the meantime, the patient was advised to take aspirin 325 mg a day in addition to the 75 mg of Plavix per day. A platelet function test will be performed at the time of anesthesia consultation. Patient was advised to maintain adequate hydration. They both leave with good  understanding and agreement with the above management plan. Electronically Signed   By: Julieanne Cotton M.D.   On: 05/02/2020 14:14   IR ANGIO VERTEBRAL SEL VERTEBRAL UNI R MOD  SED  Result Date: 05/03/2020 CLINICAL DATA:  History of left cerebral hemispheric ischemic stroke secondary to severe proximal left ICA stenosis, seen on CT angiogram of the head and neck EXAM: BILATERAL COMMON CAROTID AND INNOMINATE ANGIOGRAPHY COMPARISON:  CT angiogram of the head and neck of February 25, 2020. MEDICATIONS: Heparin 2000 units IA. No antibiotic was administered within 1 hour of the procedure. ANESTHESIA/SEDATION: Versed 1 mg IV; Fentanyl 25 mcg IV Moderate Sedation Time:  47 minutes The patient was continuously monitored during the procedure by the interventional radiology nurse under my direct supervision. CONTRAST:  Isovue 300 approximately 65 mL FLUOROSCOPY TIME:  Fluoroscopy Time: 13 minutes 12 seconds (966 mGy). COMPLICATIONS: None immediate. TECHNIQUE: Informed written consent was obtained from the patient after a thorough discussion of the procedural risks, benefits and alternatives. All questions were addressed. Maximal Sterile Barrier Technique was utilized including caps, mask, sterile gowns, sterile gloves, sterile drape, hand hygiene and skin antiseptic. A timeout was performed prior to the initiation of the procedure. The right forearm to the wrist was prepped and draped in the usual sterile manner. The right radial artery was identified with ultrasound, and its morphology documented. A dorsal palmar anastomosis was verified to be present. Using ultrasound guidance, right radial access was obtained over an 018 inch micro guidewire, followed by advancement of a 4/5 French radial sheath without event. The obturator, and the wire were removed. Good aspiration obtained from the side port of the radial sheath. A cocktail of 2000 units of heparin, and 200 mcg of nitroglycerin was then infused through the sheath  without event. A right radial arteriogram was performed. A 5 French Simmons 2 diagnostic catheter was then advanced over a 0.035 inch Roadrunner guidewire to the aortic arch, and selectively positioned in the right vertebral artery, the right common carotid artery, the left common carotid artery and the left subclavian artery. At the end of the procedure, hemostasis at the right radial puncture site was achieved with a wrist band. A right radial pulse was verified to be present. FINDINGS: The innominate right vertebral artery demonstrates patency its origin. The vessel is seen to opacify to the cranial skull base. Patency is seen of the right vertebrobasilar junction and the right posterior-inferior cerebellar artery. The basilar artery demonstrates approximately 50% stenosis just distal to the origins of the anterior-inferior cerebellar arteries. There is occlusion of the left posterior cerebral artery and distal P1 segment. The right posterior cerebral artery demonstrates 50% stenosis in the right P1 segment, and a 70-80% stenosis at the P2 P3 junction. Retrograde opacification of the left posterior-inferior cerebellar artery is seen from the right vertebral artery injection. The left common carotid arteriogram demonstrates the left external carotid artery to be mildly narrowed in its proximal aspect. Its branches, however, opacify normally. The left internal carotid artery just distal to the bulb has a severe tapered 85% stenosis. Distal to this the vessel opacifies widely to the cranial skull base. The petrous, cavernous and supraclinoid segments are widely patent. The left middle cerebral artery demonstrates approximately 50% stenosis in the mid M1 segment. The trifurcation branches opacify into the capillary and venous phases. The left anterior cerebral artery demonstrates patency into the capillary and venous phases. No evidence of left posterior communicating artery is seen. The right common carotid  arteriogram demonstrates the right external carotid artery and its major branches to be widely patent. The right internal carotid artery just distal to the bulb has a mild narrowing without evidence of intraluminal filling  defects or ulcerations. More distally the vessel is seen to opacify normally to the cranial skull base. The petrous, cavernous and the supraclinoid segments are widely patent. The right middle cerebral artery and the right anterior cerebral artery opacify into the capillary and venous phases. Left vertebral artery origin is widely patent. The vessel demonstrates mild tortuosity proximally. There is slow ascent of contrast to the skull base with opacification of the left posterior-inferior cerebellar artery. 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 There is approximately 50% stenosis just proximal to the origin of the left posterior-inferior cerebellar artery. 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 Approximately 50% stenosis of the left middle cerebral artery mid M1 segment. 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 Approximately 50% stenosis of the right posterior cerebral artery P1 segment, and 75% stenosis at the P2 P3 junction. 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 PLAN: Findings reviewed with patient and the daughter. Given the symptomatic nature of the left ICA stenosis, endovascular revascularization to prevent further ischemic events to the left anterior circulation was reviewed with the daughter and the patient. The procedure, the reason, alternatives were reviewed. Patient, and daughter would like to proceed with endovascular revascularization of the left internal carotid artery  proximally. This will be scheduled as soon as possible. In the meantime, the patient was advised to take aspirin 325 mg a day in addition to the 75 mg of Plavix per day. A platelet function test will be performed at the time of anesthesia consultation. Patient was advised to maintain adequate hydration. They both leave with good understanding and agreement with the above management plan. Electronically Signed   By: Julieanne Cotton M.D.   On: 05/02/2020 14:14    Treatments: Endovascular revascularization of proximal left ICA stenosis using stent assisted angioplasty.   Discharge Exam: Blood pressure 115/64, pulse 66, temperature 98 F (36.7 C), temperature source Oral, resp. rate 15, height  (1.778 m), weight 188 lb 11.4 oz (85.6 kg), SpO2 98 %. Physical Exam Vitals and nursing note reviewed.  Constitutional:      General: He is not in acute distress. Cardiovascular:     Rate and Rhythm: Normal rate and regular rhythm.     Heart sounds: Normal heart sounds. No murmur heard.   Pulmonary:     Effort: Pulmonary effort is normal. No respiratory distress.     Breath sounds: Normal breath sounds. No wheezing.  Skin:    General: Skin is warm and dry.     Comments: Right femoral puncture site soft with surrounding ecchymosis (increased from yesterday), no active bleeding or hematoma noted.  Neurological:     Mental Status: He is alert.     Comments: Alert, awake, and oriented x3. Speech and comprehension intact (aphasia slightly improved post-procedure). PERRL bilaterally. EOMs intact bilaterally without nystagmus or subjective diplopia. 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 No facial asymmetry. Tongue midline. Can spontaneously move all extremities. No pronator drift. Fine motor and coordination intact and symmetric. Distal pulses (DPs) dopplerable bilaterally.     Disposition: Discharge disposition: 01-Home or Self  Care       Discharge Instructions    Call MD for:  difficulty breathing, headache or visual disturbances   Complete by: As directed    Call MD for:  extreme fatigue   Complete by: As directed    Call MD for:  hives   Complete by: As directed    Call MD for:  persistant dizziness or light-headedness   Complete by: As directed    Call MD for:  persistant nausea and vomiting   Complete by: As directed    Call MD for:  redness, tenderness, or signs of infection (pain, swelling, redness, odor or green/yellow discharge around incision site)   Complete by: As directed    Call MD for:  severe uncontrolled pain   Complete by: As directed    Call MD for:  temperature >100.4   Complete by: As directed    Diet - low sodium heart healthy   Complete by: As directed    Discharge instructions   Complete by: As directed    Discontinue Plavix 75 mg and Aspirin 325 mg use. Begin taking Plavix 37.5 mg once daily and Aspirin 81 mg once daily. Stay hydrated by drinking plenty of water.   Driving Restrictions   Complete by: As directed    No driving self until 2 week follow-up. Ok to be passenger of car during that time.   Increase activity slowly   Complete by: As directed    Groin management for first 48 hours: 1- No going up/down stairs. 2- No unnecessary straining. 3- Use counter pressure of right groin when standing up.  Groin management following first 48 hours-2 weeks: 1- Use counter pressure of right groin when going up stairs and standing up. 2- No stooping, bending, or lifting more than 10 pounds until follow-up appointment.  Please head to ED if you notice right groin with: 1- Severe pain of right groin (especially when taking a step). 2- Increased bruising/swelling of right groin.   Remove dressing in 24 hours   Complete by: As directed    No further dressing changes needed after this- ensure area remains clean/dry until fully healed. Ok to shower day of discharge. No  submerging right groin for 7 days post-procedure.     Allergies as of 05/21/2020      Reactions   Ciprodex [ciprofloxacin-dexamethasone] Swelling   Ear canal swelling   Metformin And Related    Upset stomach      Medication List    STOP taking these medications   aspirin 325 MG tablet Replaced by: aspirin 81 MG chewable tablet     TAKE these medications   acetaminophen 500 MG tablet Commonly known as: TYLENOL Take 1,000 mg by mouth every 6 (six) hours as needed for moderate pain or headache.   aspirin 81 MG chewable tablet Chew 1 tablet (81 mg total) by mouth daily. Start taking on: May 22, 2020 Replaces: aspirin 325 MG tablet   atorvastatin 80 MG tablet Commonly known as: LIPITOR Take 1 tablet (80 mg total) by mouth daily.   b complex vitamins capsule Take 1 capsule by mouth daily.   clopidogrel 75 MG tablet Commonly known as: Plavix Take 0.5 tablets (37.5 mg total) by mouth daily. What changed: how much to take   hydrochlorothiazide 25 MG tablet Commonly known as: HYDRODIURIL Take 1 tablet (25 mg total) by mouth daily.   lisinopril 40 MG tablet Commonly known as: ZESTRIL Take 40 mg by mouth daily.   potassium chloride SA 20 MEQ tablet Commonly known as: KLOR-CON Take 1 tablet (20 mEq total) by mouth daily.   traZODone 50 MG tablet Commonly known as: DESYREL Take 50 mg by mouth at bedtime  as needed for sleep.   VITAMIN D-VITAMIN K PO Take 1 capsule by mouth daily.       Follow-up Information    Julieanne Cotton, MD Follow up in 2 week(s).   Specialties: Interventional Radiology, Radiology Why: Please follow-up with Dr. Corliss Skains in clinic 2 weeks after discharge. Our office will call you to set up this appointment. Contact information: [address] [place] [address] [phone]                Electronically Signed: Elwin Mocha, PA-C 05/21/2020, 11:48 AM   I have spent Less Than 30 Minutes discharging [address].

## 2020-05-21 NOTE — Plan of Care (Signed)
  Problem: Education: Goal: Knowledge of General Education information will improve Description: Including pain rating scale, medication(s)/side effects and non-pharmacologic comfort measures Outcome: Adequate for Discharge   

## 2020-05-21 NOTE — Progress Notes (Addendum)
NIR.  Patient underwent an image-guided cerebral arteriogram with revascularization of proximal left ICA stenosis using stent assisted angioplasty via right femoral approach 05/20/2020 by Dr. Corliss Skains.  Patient evaluated bedside alongside Dr. Corliss Skains this AM. Patient awake and alert laying in bed watching TV and eating breakfast with no complaints at this time. [name], RN at bedside.  Heart RRR. Lungs CTA. Alert, awake, and oriented x3. Speech and comprehension intact (aphasia slightly improved post-procedure). PERRL bilaterally. EOMs intact bilaterally without nystagmus or subjective diplopia. 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 No facial asymmetry. Tongue midline. Can spontaneously move all extremities. No pronator drift. Fine motor and coordination intact and symmetric. Distal pulses (DPs) dopplerable bilaterally. Right femoral puncture site soft with surrounding ecchymosis (increased from yesterday), no active bleeding or hematoma noted.  D/C neck collar. STAT VAS [place] right groin to R/O pseudoaneurysm. Possible D/C later today pending above result. NIR to follow.   Jeffery Boga Lurae Hornbrook, PA-C 05/21/2020, 8:45 AM

## 2020-05-21 NOTE — Progress Notes (Signed)
Right groin check  has been completed. Refer to [facility] under chart review to view preliminary results.   05/21/2020  11:36 AM Maripaz Mullan, Gerarda Gunther

## 2020-05-21 NOTE — Progress Notes (Signed)
Discharge instructions (including medications) discussed with and copy provided to patient/caregiver 

## 2020-05-23 ENCOUNTER — Ambulatory Visit: Payer: Medicare HMO | Admitting: Cardiology

## 2020-05-31 ENCOUNTER — Other Ambulatory Visit: Payer: Self-pay

## 2020-05-31 ENCOUNTER — Inpatient Hospital Stay (HOSPITAL_COMMUNITY)
Admission: EM | Admit: 2020-05-31 | Discharge: 2020-06-07 | DRG: 024 | Disposition: A | Discharge status: Skilled Nursing Facility | Payer: Medicare HMO | Attending: Internal Medicine | Admitting: Internal Medicine

## 2020-05-31 ENCOUNTER — Emergency Department (HOSPITAL_COMMUNITY): Payer: Medicare HMO

## 2020-05-31 ENCOUNTER — Ambulatory Visit (INDEPENDENT_AMBULATORY_CARE_PROVIDER_SITE_OTHER)
Admission: EM | Admit: 2020-05-31 | Discharge: 2020-05-31 | Disposition: A | Discharge status: Other Acute Inpatient Hospital | Payer: Medicare HMO | Source: Home / Self Care

## 2020-05-31 ENCOUNTER — Inpatient Hospital Stay (HOSPITAL_COMMUNITY): Payer: Medicare HMO

## 2020-05-31 DIAGNOSIS — I2510 Atherosclerotic heart disease of native coronary artery without angina pectoris: Secondary | ICD-10-CM | POA: Diagnosis present

## 2020-05-31 DIAGNOSIS — Z7982 Long term (current) use of aspirin: Secondary | ICD-10-CM

## 2020-05-31 DIAGNOSIS — H53461 Homonymous bilateral field defects, right side: Secondary | ICD-10-CM | POA: Diagnosis present

## 2020-05-31 DIAGNOSIS — I70201 Unspecified atherosclerosis of native arteries of extremities, right leg: Secondary | ICD-10-CM | POA: Diagnosis present

## 2020-05-31 DIAGNOSIS — I63512 Cerebral infarction due to unspecified occlusion or stenosis of left middle cerebral artery: Secondary | ICD-10-CM | POA: Diagnosis not present

## 2020-05-31 DIAGNOSIS — Z8673 Personal history of transient ischemic attack (TIA), and cerebral infarction without residual deficits: Secondary | ICD-10-CM | POA: Insufficient documentation

## 2020-05-31 DIAGNOSIS — I10 Essential (primary) hypertension: Secondary | ICD-10-CM | POA: Diagnosis present

## 2020-05-31 DIAGNOSIS — I63312 Cerebral infarction due to thrombosis of left middle cerebral artery: Secondary | ICD-10-CM | POA: Diagnosis not present

## 2020-05-31 DIAGNOSIS — R482 Apraxia: Secondary | ICD-10-CM | POA: Diagnosis present

## 2020-05-31 DIAGNOSIS — Z87891 Personal history of nicotine dependence: Secondary | ICD-10-CM

## 2020-05-31 DIAGNOSIS — R29703 NIHSS score 3: Secondary | ICD-10-CM | POA: Diagnosis present

## 2020-05-31 DIAGNOSIS — I959 Hypotension, unspecified: Secondary | ICD-10-CM | POA: Diagnosis present

## 2020-05-31 DIAGNOSIS — I252 Old myocardial infarction: Secondary | ICD-10-CM

## 2020-05-31 DIAGNOSIS — I6602 Occlusion and stenosis of left middle cerebral artery: Secondary | ICD-10-CM

## 2020-05-31 DIAGNOSIS — I6523 Occlusion and stenosis of bilateral carotid arteries: Secondary | ICD-10-CM | POA: Diagnosis present

## 2020-05-31 DIAGNOSIS — E1151 Type 2 diabetes mellitus with diabetic peripheral angiopathy without gangrene: Secondary | ICD-10-CM | POA: Diagnosis present

## 2020-05-31 DIAGNOSIS — Z7902 Long term (current) use of antithrombotics/antiplatelets: Secondary | ICD-10-CM

## 2020-05-31 DIAGNOSIS — I63432 Cerebral infarction due to embolism of left posterior cerebral artery: Secondary | ICD-10-CM | POA: Diagnosis present

## 2020-05-31 DIAGNOSIS — N179 Acute kidney failure, unspecified: Secondary | ICD-10-CM | POA: Diagnosis present

## 2020-05-31 DIAGNOSIS — Z20822 Contact with and (suspected) exposure to covid-19: Secondary | ICD-10-CM | POA: Diagnosis present

## 2020-05-31 DIAGNOSIS — R0989 Other specified symptoms and signs involving the circulatory and respiratory systems: Secondary | ICD-10-CM | POA: Diagnosis not present

## 2020-05-31 DIAGNOSIS — Z8249 Family history of ischemic heart disease and other diseases of the circulatory system: Secondary | ICD-10-CM | POA: Diagnosis not present

## 2020-05-31 DIAGNOSIS — R4182 Altered mental status, unspecified: Secondary | ICD-10-CM

## 2020-05-31 DIAGNOSIS — E119 Type 2 diabetes mellitus without complications: Secondary | ICD-10-CM

## 2020-05-31 DIAGNOSIS — Z823 Family history of stroke: Secondary | ICD-10-CM | POA: Diagnosis not present

## 2020-05-31 DIAGNOSIS — I639 Cerebral infarction, unspecified: Secondary | ICD-10-CM | POA: Diagnosis present

## 2020-05-31 DIAGNOSIS — I63532 Cerebral infarction due to unspecified occlusion or stenosis of left posterior cerebral artery: Secondary | ICD-10-CM | POA: Diagnosis present

## 2020-05-31 DIAGNOSIS — I6529 Occlusion and stenosis of unspecified carotid artery: Secondary | ICD-10-CM | POA: Diagnosis present

## 2020-05-31 DIAGNOSIS — R488 Other symbolic dysfunctions: Secondary | ICD-10-CM | POA: Diagnosis present

## 2020-05-31 DIAGNOSIS — E785 Hyperlipidemia, unspecified: Secondary | ICD-10-CM | POA: Diagnosis present

## 2020-05-31 DIAGNOSIS — F802 Mixed receptive-expressive language disorder: Secondary | ICD-10-CM | POA: Diagnosis present

## 2020-05-31 DIAGNOSIS — I69398 Other sequelae of cerebral infarction: Secondary | ICD-10-CM | POA: Diagnosis not present

## 2020-05-31 DIAGNOSIS — D649 Anemia, unspecified: Secondary | ICD-10-CM | POA: Diagnosis present

## 2020-05-31 DIAGNOSIS — I63412 Cerebral infarction due to embolism of left middle cerebral artery: Principal | ICD-10-CM | POA: Diagnosis present

## 2020-05-31 DIAGNOSIS — Z95820 Peripheral vascular angioplasty status with implants and grafts: Secondary | ICD-10-CM

## 2020-05-31 DIAGNOSIS — Z79899 Other long term (current) drug therapy: Secondary | ICD-10-CM

## 2020-05-31 DIAGNOSIS — R480 Dyslexia and alexia: Secondary | ICD-10-CM | POA: Diagnosis present

## 2020-05-31 DIAGNOSIS — Z888 Allergy status to other drugs, medicaments and biological substances status: Secondary | ICD-10-CM

## 2020-05-31 DIAGNOSIS — Z881 Allergy status to other antibiotic agents status: Secondary | ICD-10-CM

## 2020-05-31 DIAGNOSIS — Z1389 Encounter for screening for other disorder: Secondary | ICD-10-CM

## 2020-05-31 HISTORY — DX: Altered mental status, unspecified: R41.82

## 2020-05-31 HISTORY — DX: Hyperlipidemia, unspecified: E78.5

## 2020-05-31 LAB — CBC WITH DIFFERENTIAL/PLATELET
Abs Immature Granulocytes: 0.04 10*3/uL (ref 0.00–0.07)
Basophils Absolute: 0 10*3/uL (ref 0.0–0.1)
Basophils Relative: 1 %
Eosinophils Absolute: 0 10*3/uL (ref 0.0–0.5)
Eosinophils Relative: 1 %
HCT: 33 % — ABNORMAL LOW (ref 39.0–52.0)
Hemoglobin: 10.8 g/dL — ABNORMAL LOW (ref 13.0–17.0)
Immature Granulocytes: 1 %
Lymphocytes Relative: 14 %
Lymphs Abs: 1.2 10*3/uL (ref 0.7–4.0)
MCH: 32.4 pg (ref 26.0–34.0)
MCHC: 32.7 g/dL (ref 30.0–36.0)
MCV: 99.1 fL (ref 80.0–100.0)
Monocytes Absolute: 0.5 10*3/uL (ref 0.1–1.0)
Monocytes Relative: 6 %
Neutro Abs: 6.7 10*3/uL (ref 1.7–7.7)
Neutrophils Relative %: 77 %
Platelets: 364 10*3/uL (ref 150–400)
RBC: 3.33 MIL/uL — ABNORMAL LOW (ref 4.22–5.81)
RDW: 14.5 % (ref 11.5–15.5)
WBC: 8.5 10*3/uL (ref 4.0–10.5)
nRBC: 0 % (ref 0.0–0.2)

## 2020-05-31 LAB — URINALYSIS, ROUTINE W REFLEX MICROSCOPIC
Bilirubin Urine: NEGATIVE
Glucose, UA: NEGATIVE mg/dL
Hgb urine dipstick: NEGATIVE
Ketones, ur: NEGATIVE mg/dL
Leukocytes,Ua: NEGATIVE
Nitrite: NEGATIVE
Protein, ur: NEGATIVE mg/dL
Specific Gravity, Urine: 1.019 (ref 1.005–1.030)
pH: 5 (ref 5.0–8.0)

## 2020-05-31 LAB — COMPREHENSIVE METABOLIC PANEL
ALT: 26 U/L (ref 0–44)
AST: 26 U/L (ref 15–41)
Albumin: 4.1 g/dL (ref 3.5–5.0)
Alkaline Phosphatase: 62 U/L (ref 38–126)
Anion gap: 8 (ref 5–15)
BUN: 35 mg/dL — ABNORMAL HIGH (ref 8–23)
CO2: 28 mmol/L (ref 22–32)
Calcium: 10.5 mg/dL — ABNORMAL HIGH (ref 8.9–10.3)
Chloride: 99 mmol/L (ref 98–111)
Creatinine, Ser: 1.52 mg/dL — ABNORMAL HIGH (ref 0.61–1.24)
GFR, Estimated: 49 mL/min — ABNORMAL LOW (ref >60)
Glucose, Bld: 111 mg/dL — ABNORMAL HIGH (ref 70–99)
Potassium: 4 mmol/L (ref 3.5–5.1)
Sodium: 135 mmol/L (ref 135–145)
Total Bilirubin: 2 mg/dL — ABNORMAL HIGH (ref 0.3–1.2)
Total Protein: 7.3 g/dL (ref 6.5–8.1)

## 2020-05-31 LAB — RESP PANEL BY RT-PCR (FLU A&B, COVID) ARPGX2
Influenza A by PCR: NEGATIVE
Influenza B by PCR: NEGATIVE
SARS Coronavirus 2 by RT PCR: NEGATIVE

## 2020-05-31 LAB — RAPID URINE DRUG SCREEN, HOSP PERFORMED
Amphetamines: NOT DETECTED
Barbiturates: NOT DETECTED
Benzodiazepines: NOT DETECTED
Cocaine: NOT DETECTED
Opiates: NOT DETECTED
Tetrahydrocannabinol: NOT DETECTED

## 2020-05-31 LAB — CBG MONITORING, ED: Glucose-Capillary: 107 mg/dL — ABNORMAL HIGH (ref 70–99)

## 2020-05-31 IMAGING — CT CT HEAD W/O CM
4 series · 16 of 47 positions shown, 18 images · non-contrast
Comparison: CT head [DATE].  MRI brain [DATE].

CLINICAL DATA: 69-year-old sent from [REDACTED] Urgent
Clinic for evaluation of confusion of unknown duration.

EXAM:
CT HEAD WITHOUT CONTRAST
TECHNIQUE: Contiguous axial images were obtained from the base of the skull
through the vertex without intravenous contrast.

[Series 2: head wo · axial · 0.43mm/px · z∈[-88,+32]mm · 7 of 34 slices shown, 9 images]
[im 5/34  brain]
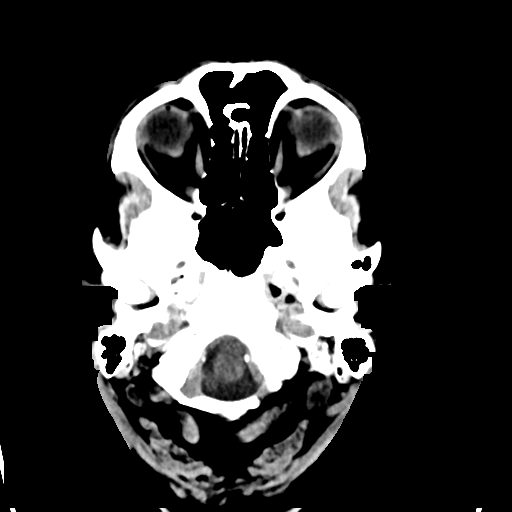
[im 5/34  bone]
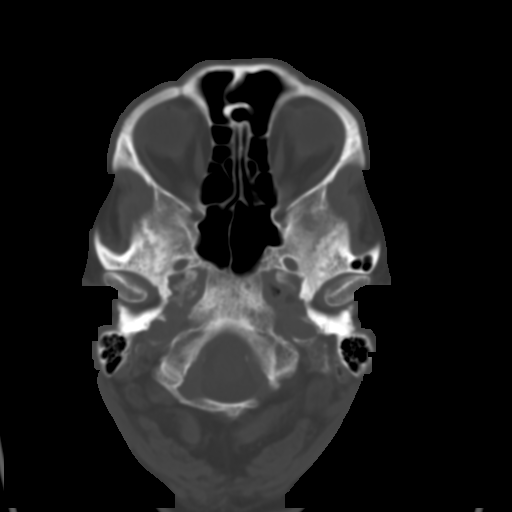
[im 9/34  brain]
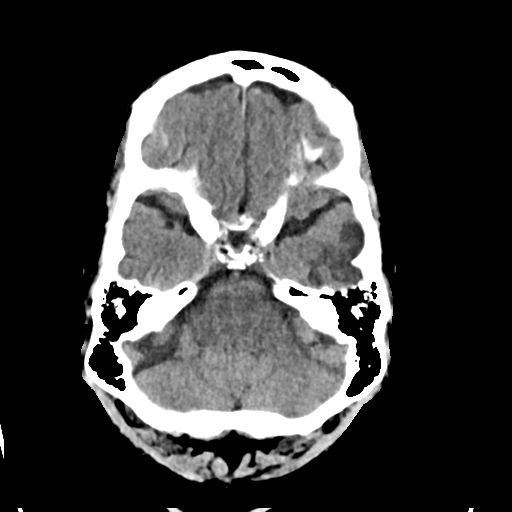
[im 13/34  brain]
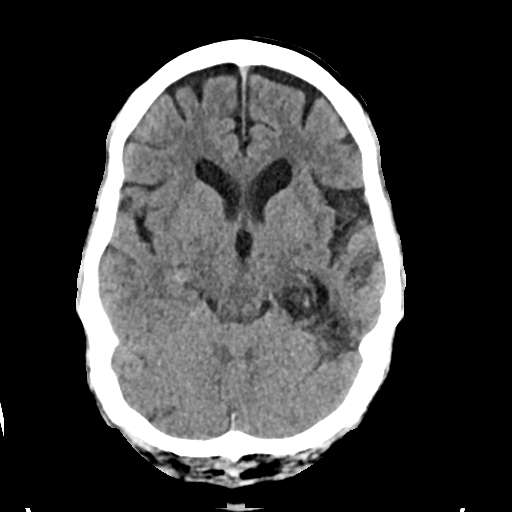
[im 17/34  brain]
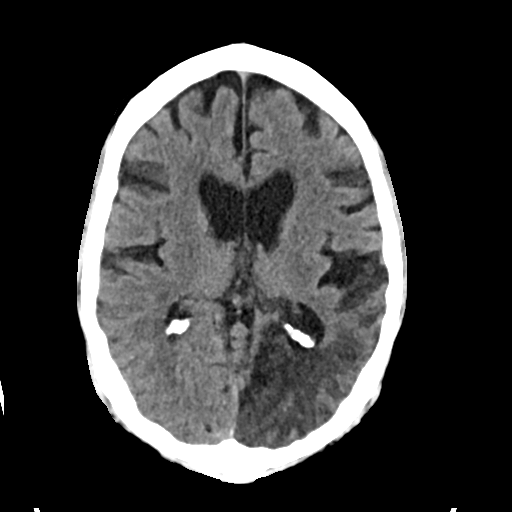
[im 21/34  brain]
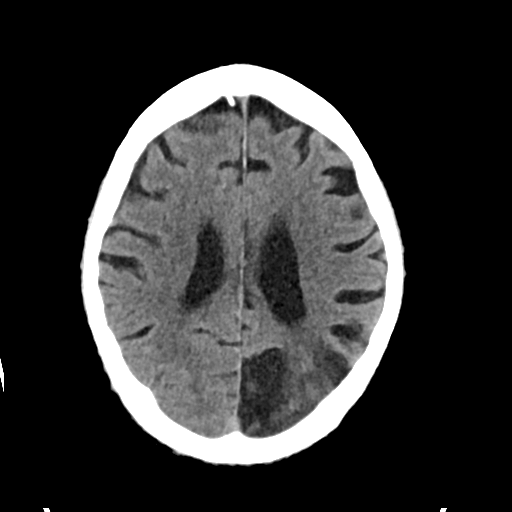
[im 21/34  bone]
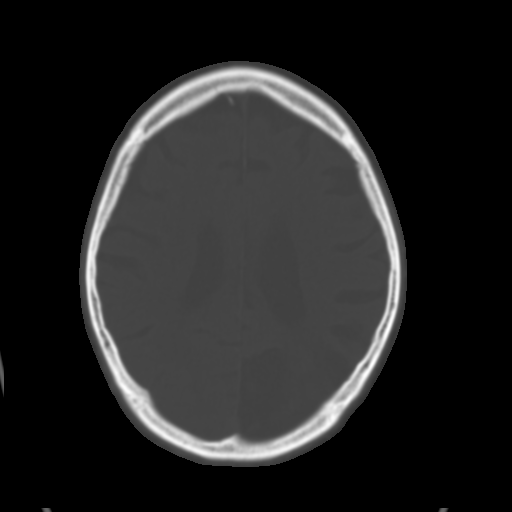
[im 25/34  brain]
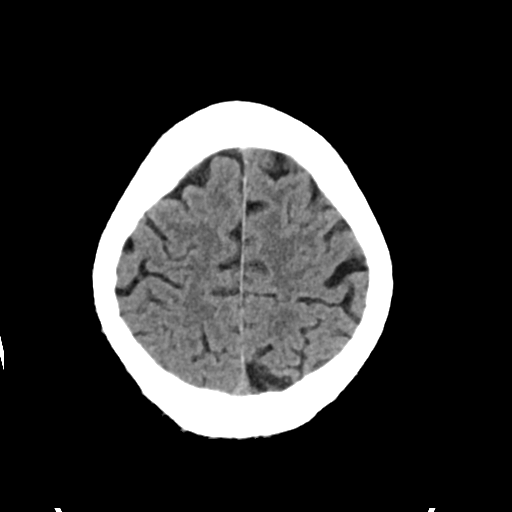
[im 29/34  brain]
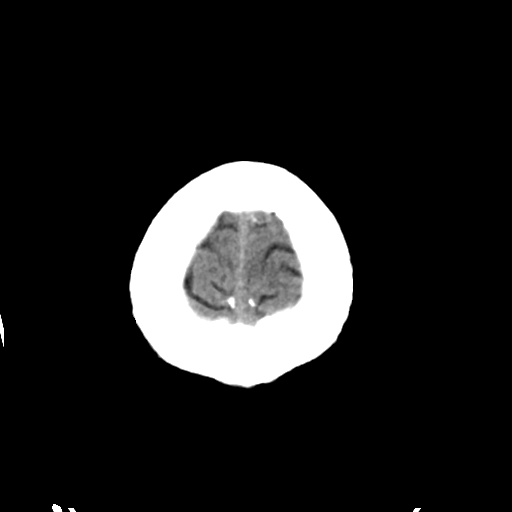

[Series 3: head bone · axial · 0.43mm/px · z∈[-92,-60]mm · 3 of 84 slices shown]
[im 9/84  bone]
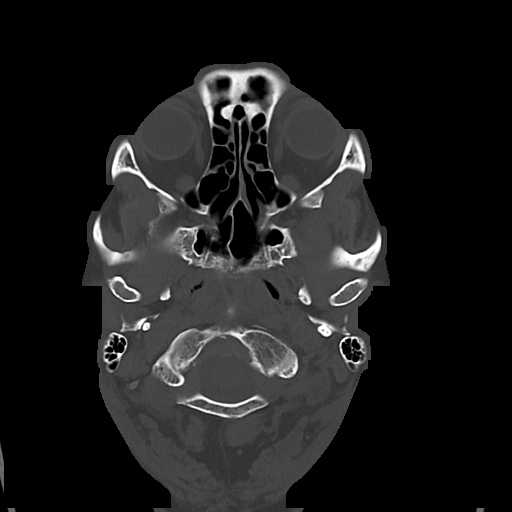
[im 17/84  bone]
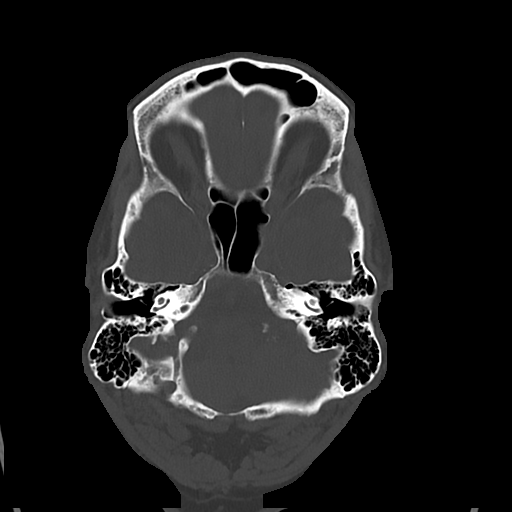
[im 25/84  bone]
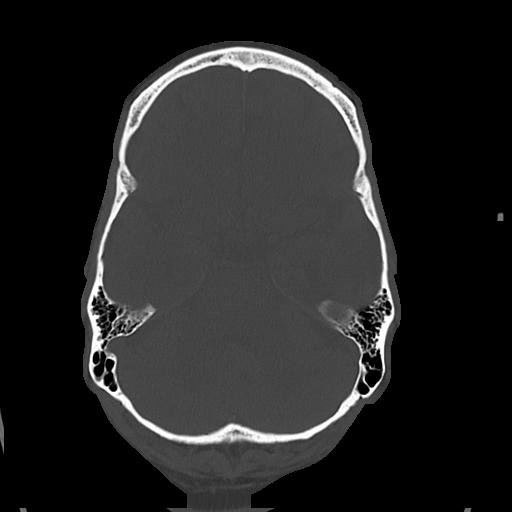

[Series 4: cor soft · coronal · 0.35mm/px · 3 of 68 slices shown]
[im 23/68  brain]
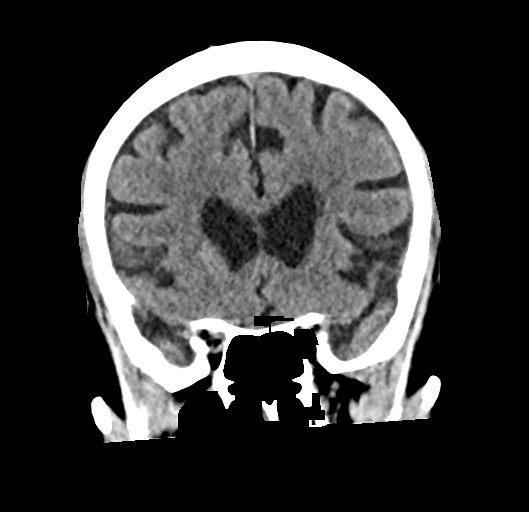
[im 30/68  brain]
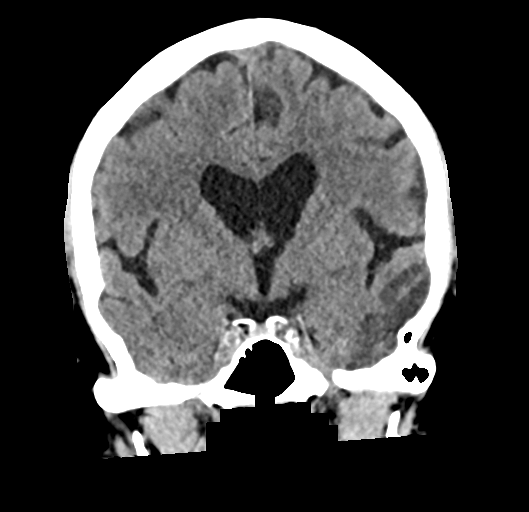
[im 38/68  brain]
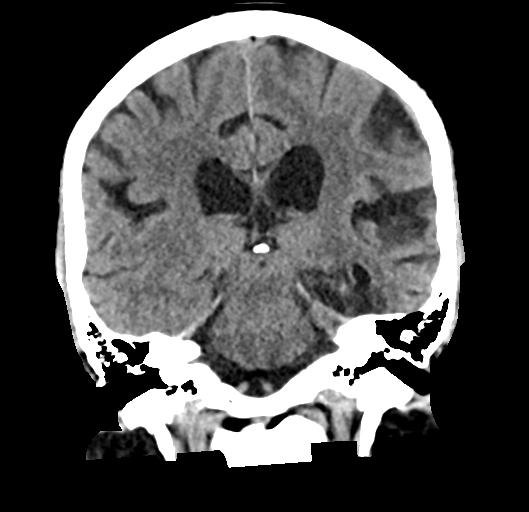

[Series 5: sag soft · sagittal · 0.35mm/px · 3 of 62 slices shown]
[im 21/62  brain]
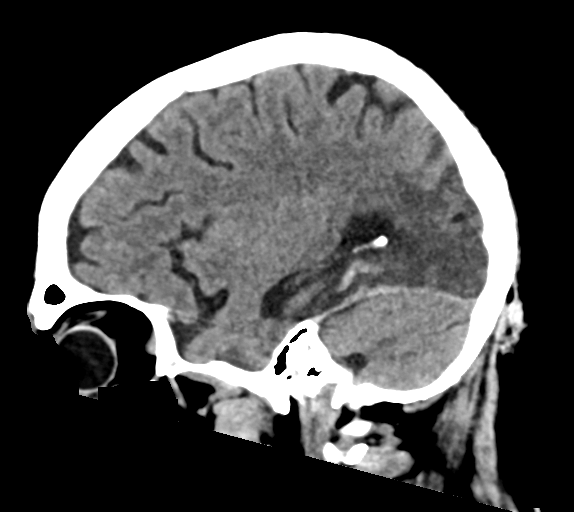
[im 31/62  brain]
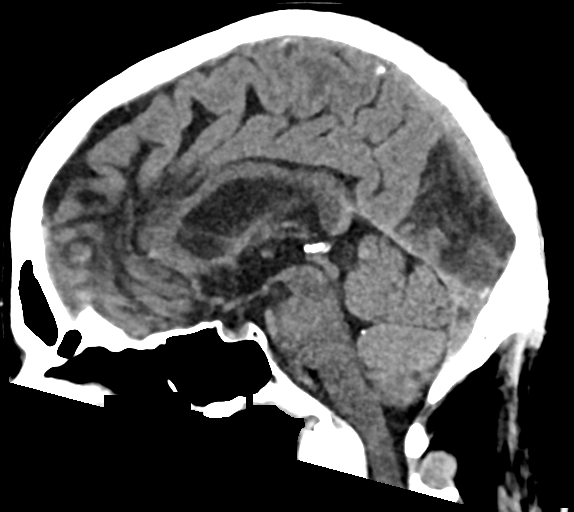
[im 41/62  brain]
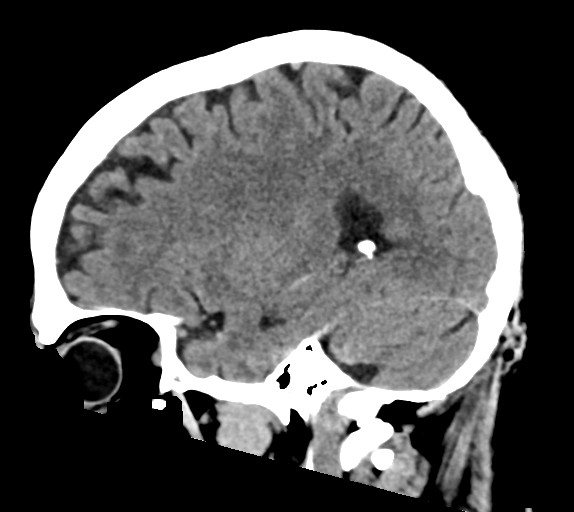

[16 of 47 positions shown; findings below may reference images not displayed]

FINDINGS: Brain: Geographic low attenuation involving the LEFT temporal lobe,
LEFT posterior parietal lobe and the entire LEFT occipital lobe
(within the distribution of the LEFT posterior cerebral artery and a
portion of the LEFT middle cerebral artery) with sparing of a few
peripheral gyri. The patient had a small stroke in this distribution
on the prior MRI in [DATE], but today the distribution
involves nearly the entire occipital lobe and part of the posterior
parietal lobe. There is a small focus of acute hemorrhage inferiorly
and medially in the occipital lobe (series 2, image 14 and series 4,
image 42). At this point, there is no significant mass effect or
midline shift.

Ventricular system normal in size and appearance for age. Stable
mild age related cortical atrophy.

Vascular: Moderate BILATERAL carotid siphon and LEFT vertebral
artery atherosclerosis. Hyperdense LEFT posterior cerebral artery
([DATE]).

Skull: No skull fracture or other focal osseous abnormality
involving the skull.

Sinuses/Orbits: Visualized paranasal sinuses, bilateral mastoid air
cells and bilateral middle ear cavities well-aerated. Visualized
orbits and globes normal in appearance.

Other: None.
IMPRESSION: 1. Very large acute/subacute stroke in the LEFT posterior cerebral
artery and LEFT middle cerebral artery distributions, with
involvement of the LEFT temporal lobe, LEFT posterior parietal lobe
and the entire LEFT occipital lobe.
2. Hyperdense LEFT posterior cerebral artery.
3. Very small associated acute hemorrhage in the inferomedial
occipital lobe.
4. No significant mass effect or midline shift at this time.

I telephoned these critical results at the time of interpretation on
[DATE] at [DATE] to provider ELDER R , PA, who verbally
acknowledged these results.

## 2020-05-31 IMAGING — DX DG ABD PORTABLE 1V
1 series · 1 of 1 positions shown · non-contrast
Comparison: None.

CLINICAL DATA: Screening for metal prior to MRI.

EXAM:
PORTABLE ABDOMEN - 1 VIEW

[abdomen supine]
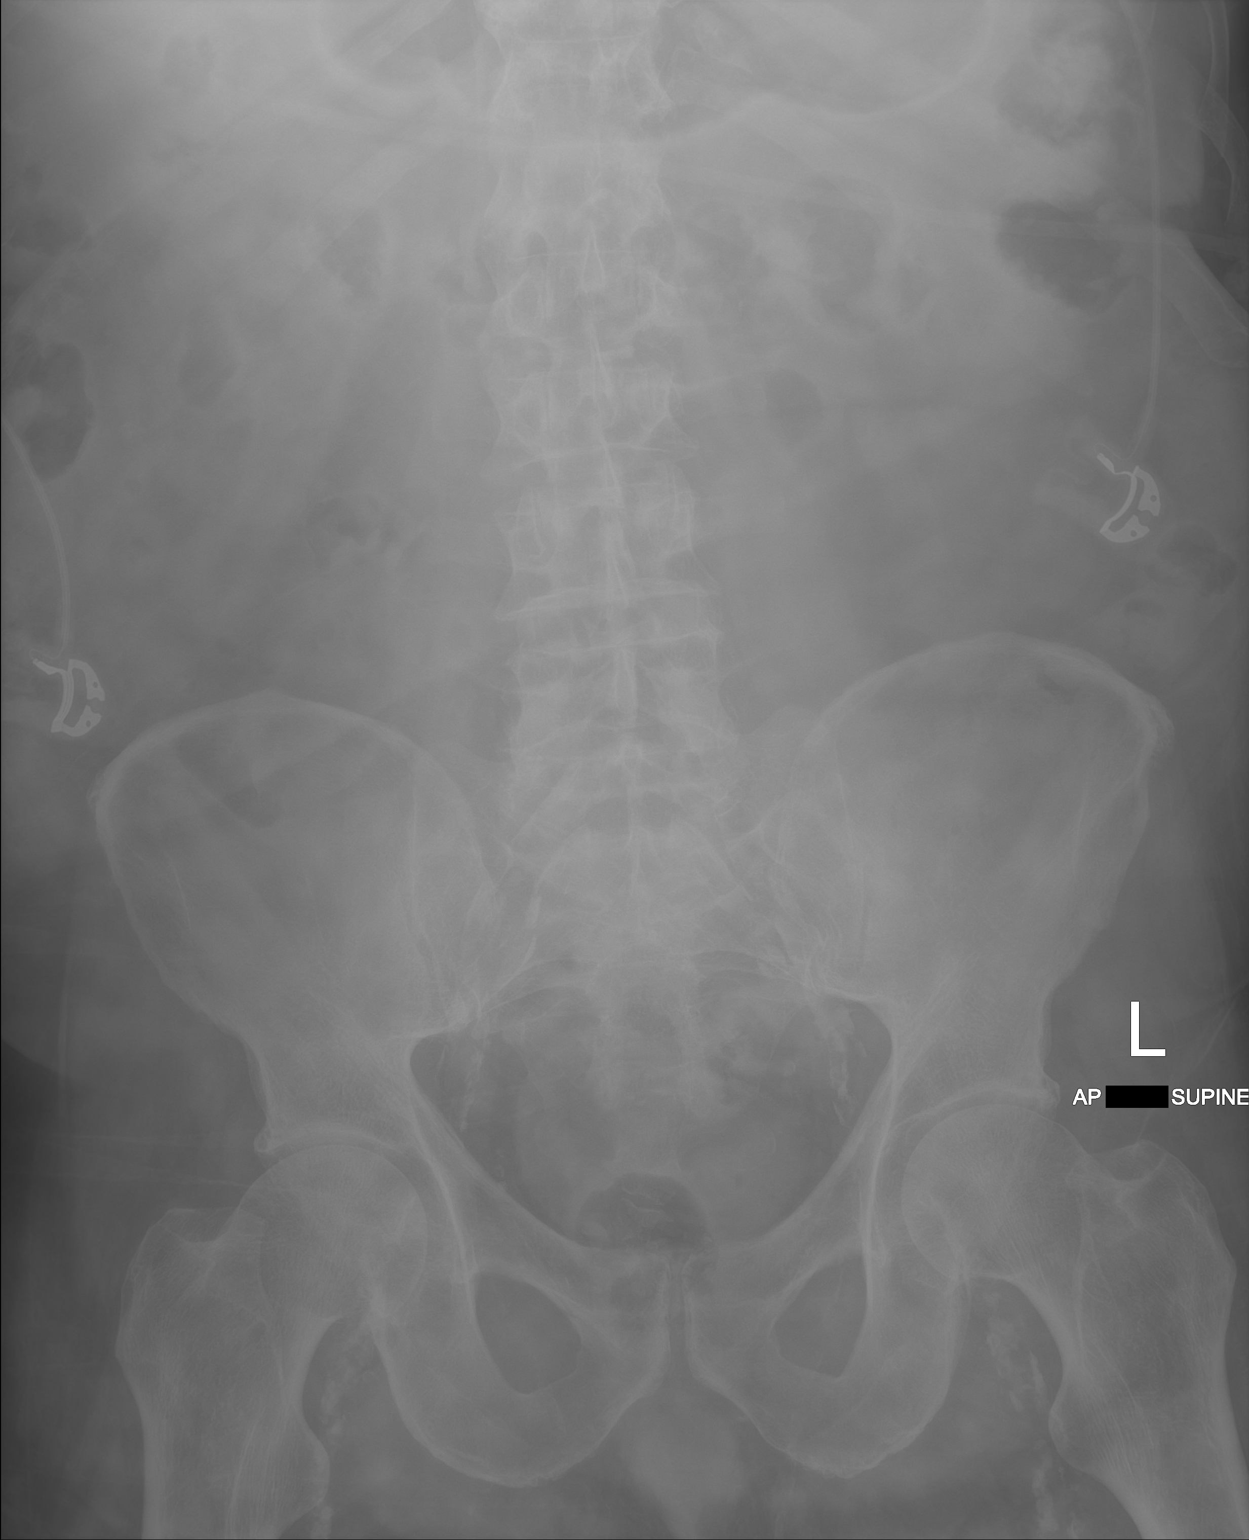

[1 of 1 positions shown; findings below may reference images not displayed]

FINDINGS: Overlying monitoring devices in place. No implanted medical device
or radiopaque foreign body to preclude MRI imaging. Normal bowel gas
pattern. Advanced vascular calcifications. No radiopaque calculi. No
acute osseous abnormalities are seen.
IMPRESSION: No implanted medical device or radiopaque foreign body to preclude
MRI imaging.

## 2020-05-31 IMAGING — DX DG CHEST 1V PORT
1 series · 1 of 1 positions shown · non-contrast
Comparison: None.

CLINICAL DATA: Screening for metal prior to MRI.

EXAM:
PORTABLE CHEST 1 VIEW

[chest ap]
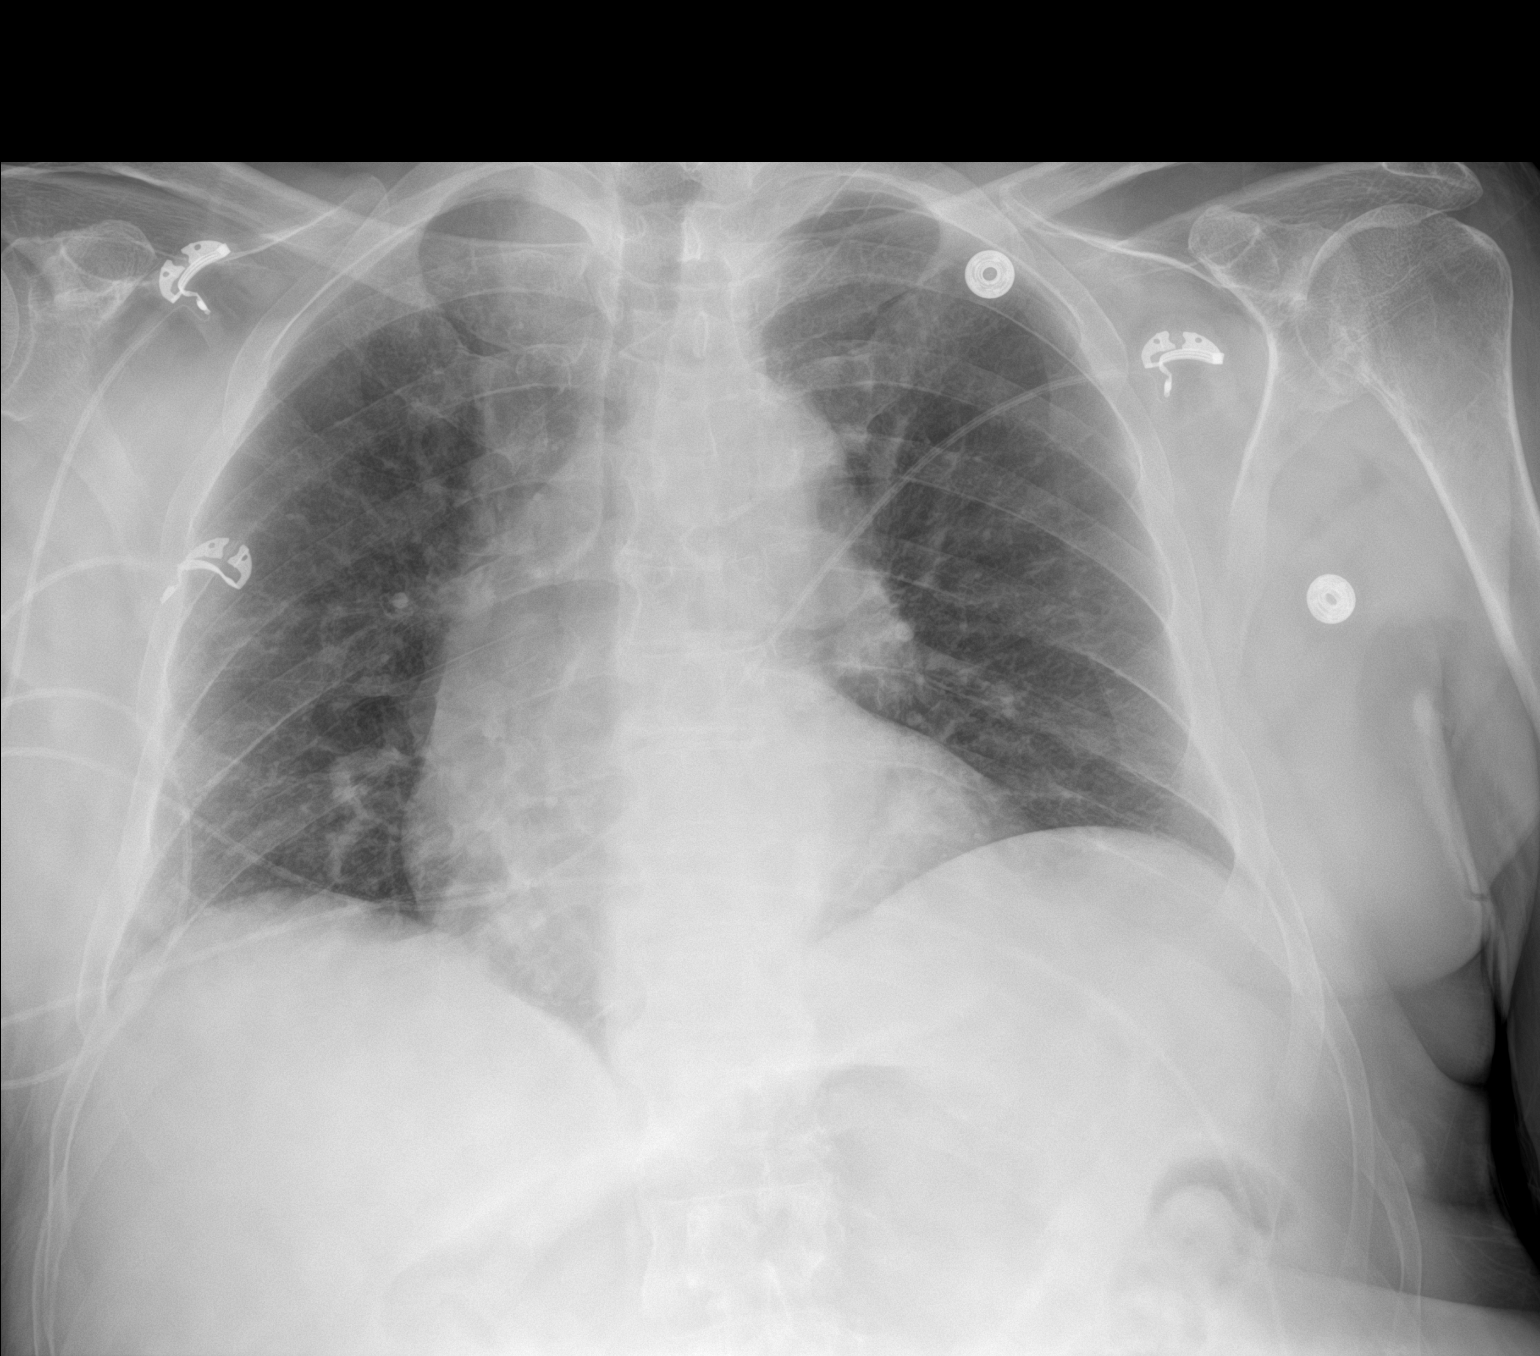

[1 of 1 positions shown; findings below may reference images not displayed]

FINDINGS: Overlying monitoring devices in place. Lung volumes are low. No
implanted medical device or radiopaque foreign body in the chest.
Normal heart size. Aortic atherosclerosis and tortuosity. No pleural
effusion or pneumothorax. No focal airspace disease. No acute
osseous abnormalities are seen.
IMPRESSION: 1. No implanted medical device or radiopaque foreign body in the
chest to preclude MRI imaging.
2. Low lung volumes without acute abnormality.

## 2020-05-31 MED ORDER — SODIUM CHLORIDE 0.9 % IV BOLUS
1000.0000 mL | Freq: Once | INTRAVENOUS | Status: AC
  Administered 2020-05-31: 1000 mL via INTRAVENOUS

## 2020-05-31 MED ORDER — ACETAMINOPHEN 650 MG RE SUPP: 650.0000 mg | RECTAL | Status: DC | PRN

## 2020-05-31 MED ORDER — ENOXAPARIN SODIUM 40 MG/0.4ML IJ SOSY
40.0000 mg | PREFILLED_SYRINGE | INTRAMUSCULAR | Status: DC
  Administered 2020-05-31 – 2020-06-02 (×3): 40 mg via SUBCUTANEOUS
  Filled 2020-05-31 (×3): qty 0.4

## 2020-05-31 MED ORDER — STROKE: EARLY STAGES OF RECOVERY BOOK
Freq: Once | Status: AC
  Filled 2020-05-31 (×2): qty 1

## 2020-05-31 MED ORDER — SODIUM CHLORIDE 0.9 % IV SOLN: INTRAVENOUS | Status: AC

## 2020-05-31 MED ORDER — ASPIRIN 81 MG PO CHEW
81.0000 mg | CHEWABLE_TABLET | Freq: Every day | ORAL | Status: DC
  Administered 2020-05-31 – 2020-06-04 (×5): 81 mg via ORAL
  Filled 2020-05-31 (×4): qty 1

## 2020-05-31 MED ORDER — CLOPIDOGREL BISULFATE 75 MG PO TABS
37.5000 mg | ORAL_TABLET | Freq: Every day | ORAL | Status: DC
  Administered 2020-06-01: 37.5 mg via ORAL
  Filled 2020-05-31: qty 1

## 2020-05-31 MED ORDER — ACETAMINOPHEN 325 MG PO TABS: 650.0000 mg | ORAL_TABLET | ORAL | Status: DC | PRN

## 2020-05-31 MED ORDER — ACETAMINOPHEN 160 MG/5ML PO SOLN: 650.0000 mg | ORAL | Status: DC | PRN
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
## 2020-05-31 NOTE — H&P (Signed)
History and Physical:    [name]   ZOX:[id number] DOB: 1950-04-03 DOA: 05/31/2020  Referring MD/provider: Dr. Rosalia Hammersay PCP: [name], Jeffery J, PA-C   Patient coming from: Home  Chief Complaint: Recurrent stroke of PCA  History of Present Illness:   [name] is an 70 y.o. male with history of prior PCA stroke and placement of ICA stent for left ICA stenosis 10 days ago was found by his friend today to be altered.  History is entirely per chart and BH H note.  Patient apparently was staying with his daughter until 5 days ago when he was picked up by his friend.  Apparently over the last couple of days patient has been increasingly confused and has had decreased p.o. intake.  Patient's friend brought him to behavioral health where he was seen by the family nurse practitioner there who called to the ED.  It is unclear when his last known normal was.  I spoke with patient's daughter [name] who notes that patient has expressive aphasia at baseline and that has had multiple strokes in the past couple of months.   Patient himself has a clear expressive aphasia that is fluent.  He is intermittently making sense but intermittently not.  He has some word salad but no neologisms.  He seems to understand that he is in the hospital.  He does not feel unwell.  He states he is hungry.  He has no questions  ED Course:  The patient was noted to be afebrile and normotensive.  He underwent a noncontrast CT which shows a large PCA and MCA territory stroke involving left temporal lobe.  He was discussed with neurology who recommended admission to hospitalist service.  ROS:     Review of Systems: Unable to do secondary to expressive aphasia   Past Medical History:   Past Medical History:  Diagnosis Date  . Hypertension   . Myocardial infarction (HCC)    1990's  . Stroke [facility](HCC) 03/15/2020    Past Surgical History:   Past Surgical History:  Procedure Laterality Date  .  IR ANGIO INTRA EXTRACRAN SEL COM CAROTID INNOMINATE BILAT MOD SED  05/02/2020  . IR ANGIO VERTEBRAL SEL SUBCLAVIAN INNOMINATE UNI L MOD SED  05/02/2020  . IR ANGIO VERTEBRAL SEL SUBCLAVIAN INNOMINATE UNI R MOD SED  05/20/2020  . IR ANGIO VERTEBRAL SEL VERTEBRAL UNI R MOD SED  05/02/2020  . IR INTRAVSC STENT CERV CAROTID W/EMB-PROT MOD SED INCL ANGIO  05/20/2020  . IR RADIOLOGIST EVAL & MGMT  03/27/2020  . IR US GUIDE VASC ACCESS RIGHT  05/02/2020  . IR US GUIDE VASC ACCESS RIGHT  05/20/2020  . RADIOLOGY WITH ANESTHESIA Left 05/20/2020   Procedure: RADIOLOGY WITH ANESTHESIA  LEFT ICA STENTING;  Surgeon: Julieanne Cottoneveshwar, Sanjeev, MD;  Location: [facility] OR;  Service: Radiology;  Laterality: Left;    Social History:   Social History   Socioeconomic History  . Marital status: Single    Spouse name: Not on file  . Number of children: Not on file  . Years of education: Not on file  . Highest education level: Not on file  Occupational History  . Not on file  Tobacco Use  . Smoking status: Former Smoker    Types: Cigarettes    Quit date: 2003    Years since quitting: 19.3  . Smokeless tobacco: Never Used  Vaping Use  . Vaping Use: Never used  Substance and Sexual Activity  . Alcohol use: Yes  Comment: occasionally. Not since stroke in February  . Drug use: No  . Sexual activity: Not on file  Other Topics Concern  . Not on file  Social History Narrative   Lives with daughter (temporary after stroke)   Right handed   Drinks 1-2 cups caffeine daily   Social Determinants of Health   Financial Resource Strain: Not on file  Food Insecurity: Not on file  Transportation Needs: Not on file  Physical Activity: Not on file  Stress: Not on file  Social Connections: Not on file  Intimate Partner Violence: Not on file    Allergies   Ciprodex [ciprofloxacin-dexamethasone] and Metformin and related  Family history:   Family History  Problem Relation Age of Onset  . Heart attack Father   . Stroke  Paternal Great-grandfather     Current Medications:   Prior to Admission medications   Medication Sig Start Date End Date Taking? Authorizing Provider  acetaminophen (TYLENOL) 500 MG tablet Take 1,000 mg by mouth every 6 (six) hours as needed for moderate pain or headache.    [provider]  aspirin 81 MG chewable tablet Chew 1 tablet (81 mg total) by mouth daily. 05/22/20   Louk, Waylan Boga, PA-C  atorvastatin (LIPITOR) 80 MG tablet Take 1 tablet (80 mg total) by mouth daily. 03/17/20   Azucena Fallen, MD  b complex vitamins capsule Take 1 capsule by mouth daily.    [provider]  clopidogrel (PLAVIX) 75 MG tablet Take 0.5 tablets (37.5 mg total) by mouth daily. 05/21/20 08/19/20  Louk, Waylan Boga, PA-C  hydrochlorothiazide (HYDRODIURIL) 25 MG tablet Take 1 tablet (25 mg total) by mouth daily. 03/18/20   Hughie Closs, MD  lisinopril (ZESTRIL) 40 MG tablet Take 40 mg by mouth daily.    [provider]  potassium chloride SA (KLOR-CON) 20 MEQ tablet Take 1 tablet (20 mEq total) by mouth daily. 03/22/20   Azucena Fallen, MD  traZODone (DESYREL) 50 MG tablet Take 50 mg by mouth at bedtime as needed for sleep. 04/28/20   [provider]  VITAMIN D-VITAMIN K PO Take 1 capsule by mouth daily.    [provider]    Physical Exam:   Vitals:   05/31/20 1333 05/31/20 1610  BP:  114/64  Pulse:  63  Resp:  17  Temp:  98.1 F (36.7 C)  TempSrc:  Oral  SpO2: 97% 100%     Physical Exam: Blood pressure 114/64, pulse 63, temperature 98.1 F (36.7 C), temperature source Oral, resp. rate 17, SpO2 100 %. Gen: Elderly man looking older than stated age lying in bed looking somewhat confused. Eyes: sclera anicteric, conjuctiva mildly injected bilaterally CVS: S1-S2, regulary, no gallops Respiratory:  decreased air entry likely secondary to decreased inspiratory effort GI: NABS, soft, NT  LE: No edema. No cyanosis Neuro: Clear expressive aphasia.   His comprehension seems to be intact but it is hard to tell.  Moving all extremities equally with normal strength. Psych: Appears anxious   Data Review:    Labs: Basic Metabolic Panel: Recent Labs  Lab 05/31/20 1411  NA 135  K 4.0  CL 99  CO2 28  GLUCOSE 111*  BUN 35*  CREATININE 1.52*  CALCIUM 10.5*   Liver Function Tests: Recent Labs  Lab 05/31/20 1411  AST 26  ALT 26  ALKPHOS 62  BILITOT 2.0*  PROT 7.3  ALBUMIN 4.1   No results for input(s): LIPASE, AMYLASE in the last 168 hours.  No results for input(s): AMMONIA in the last 168 hours. CBC: Recent Labs  Lab 05/31/20 1411  WBC 8.5  NEUTROABS 6.7  HGB 10.8*  HCT 33.0*  MCV 99.1  PLT 364   Cardiac Enzymes: No results for input(s): CKTOTAL, CKMB, CKMBINDEX, TROPONINI in the last 168 hours.  BNP (last 3 results) No results for input(s): PROBNP in the last 8760 hours. CBG: Recent Labs  Lab 05/31/20 1338  GLUCAP 107*    Urinalysis    Component Value Date/Time   COLORURINE YELLOW 05/31/2020 1458   APPEARANCEUR HAZY (A) 05/31/2020 1458   LABSPEC 1.019 05/31/2020 1458   PHURINE 5.0 05/31/2020 1458   GLUCOSEU NEGATIVE 05/31/2020 1458   HGBUR NEGATIVE 05/31/2020 1458   BILIRUBINUR NEGATIVE 05/31/2020 1458   KETONESUR NEGATIVE 05/31/2020 1458   PROTEINUR NEGATIVE 05/31/2020 1458   UROBILINOGEN 0.2 08/10/2008 2001   NITRITE NEGATIVE 05/31/2020 1458   LEUKOCYTESUR NEGATIVE 05/31/2020 1458      Radiographic Studies: CT Head Wo Contrast  Result Date: 05/31/2020 CLINICAL DATA:  70 year old sent from [facility] for evaluation of confusion of unknown duration. EXAM: CT HEAD WITHOUT CONTRAST TECHNIQUE: Contiguous axial images were obtained from the base of the skull through the vertex without intravenous contrast. COMPARISON:  CT head 03/15/2020.  MRI brain 03/16/2020. FINDINGS: Brain: Geographic low attenuation involving the LEFT temporal lobe, LEFT posterior parietal lobe and the  entire LEFT occipital lobe (within the distribution of the LEFT posterior cerebral artery and a portion of the LEFT middle cerebral artery) with sparing of a few peripheral gyri. The patient had a small stroke in this distribution on the prior MRI in February, 2022, but today the distribution involves nearly the entire occipital lobe and part of the posterior parietal lobe. There is a small focus of acute hemorrhage inferiorly and medially in the occipital lobe (series 2, image 14 and series 4, image 42). At this point, there is no significant mass effect or midline shift. Ventricular system normal in size and appearance for age. Stable mild age related cortical atrophy. Vascular: Moderate BILATERAL carotid siphon and LEFT vertebral artery atherosclerosis. Hyperdense LEFT posterior cerebral artery (2/13). Skull: No skull fracture or other focal osseous abnormality involving the skull. Sinuses/Orbits: Visualized paranasal sinuses, bilateral mastoid air cells and bilateral middle ear cavities well-aerated. Visualized orbits and globes normal in appearance. Other: None. IMPRESSION: 1. 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 2. Hyperdense LEFT posterior cerebral artery. 3. Very small associated acute hemorrhage in the inferomedial occipital lobe. 4. No significant mass effect or midline shift at this time. I telephoned these critical results at the time of interpretation on 05/31/2020 at 3:08 pm to provider [facility] , [place], who verbally acknowledged these results. Electronically Signed   By: Hulan Saas M.D.   On: 05/31/2020 15:13    EKG: Independently reviewed. NSR at 69,    Assessment/Plan:   Principal Problem:   Stroke (cerebrum) (HCC) Active Problems:   Acute stroke due to occlusion of left posterior cerebral  artery (HCC)   Essential hypertension   Controlled type 2 diabetes mellitus without complication, without long-term current use of insulin (HCC)  70 year old man with acute/subacute possibly completed stroke and left PCA and MCA territory with temporal lobe involvement.  He does seem to have a small acute hemorrhage in the inferomedial occipital lobe.  Stroke Patient daughter notes that his expressive aphasia is old and that is not a new symptom for him, unclear what new symptoms he may have at present until PT and OT evaluated him. Patient is already on aspirin and Plavix He had left ICA stent placed 10 days ago. Discussed with neurology, will continue aspirin Hold Plavix for now given small acute hemorrhage, okay to start Lovenox for DVT prophylaxis MRI brain ordered Echocardiogram was done in February and I have not not repeated it. Formal neurology consult is pending PT/OT and in particular speech therapy have been consulted Blood pressures are somewhat low normal, will provide NS bolus and 125 cc an hour to see if he can bring his pressures up a little bit.  HTN Hold all antihypertensives  DM2 Patient does not appear to be on any diabetic medications Can start carb controlled diet once he is cleared to eat    Other information:   DVT prophylaxis: Lovenox ordered. Code Status: Full Family Communication: spoke with daughter Leota Jacobsen at [employer] Disposition Plan: TBD Consults called: Neurology Admission status: Inpatient  Arriah Wadle Tublu Aino Heckert [facility]  If 7PM-7AM, please contact night-coverage [email or web address]

## 2020-05-31 NOTE — ED Provider Notes (Signed)
[name] EMERGENCY DEPARTMENT Provider Note   CSN: [id number] Arrival date & time: 05/31/20  1308     History Chief Complaint  Patient presents with  . Altered Mental Status    [name] is a 70 y.o. male.  Pt's Friend reports he picked pt up from his daughter on Sunday.  Pt has had increasing confusion.  Confusion worse today than yesterday.  Pt seen at [facility] and sent here .   The history is provided by the patient. No language interpreter was used.  Altered Mental Status Presenting symptoms: behavior changes, confusion and disorientation   Severity:  Severe Most recent episode:  Today Timing:  Constant Progression:  Worsening Chronicity:  New Context: recent illness   Associated symptoms: decreased appetite        Past Medical History:  Diagnosis Date  . Hypertension   . Myocardial infarction (HCC)    1990's  . Stroke [facility] 03/15/2020    Patient Active Problem List   Diagnosis Date Noted  . Internal carotid artery stenosis, left 05/20/2020  . Acute stroke due to occlusion of left posterior cerebral artery (HCC) 03/16/2020  . Essential hypertension 03/16/2020  . Polycythemia 03/16/2020  . Hypercalcemia 03/16/2020  . Controlled type 2 diabetes mellitus without complication, without long-term current use of insulin (HCC) 03/16/2020    Past Surgical History:  Procedure Laterality Date  . IR ANGIO INTRA EXTRACRAN SEL COM CAROTID INNOMINATE BILAT MOD SED  05/02/2020  . IR ANGIO VERTEBRAL SEL SUBCLAVIAN INNOMINATE UNI L MOD SED  05/02/2020  . IR ANGIO VERTEBRAL SEL SUBCLAVIAN INNOMINATE UNI R MOD SED  05/20/2020  . IR ANGIO VERTEBRAL SEL VERTEBRAL UNI R MOD SED  05/02/2020  . IR INTRAVSC STENT CERV CAROTID W/EMB-PROT MOD SED INCL ANGIO  05/20/2020  . IR RADIOLOGIST EVAL & MGMT  03/27/2020  . IR US GUIDE VASC ACCESS RIGHT  05/02/2020  . IR US GUIDE VASC ACCESS RIGHT  05/20/2020  . RADIOLOGY WITH ANESTHESIA Left 05/20/2020   Procedure: RADIOLOGY WITH  ANESTHESIA  LEFT ICA STENTING;  Surgeon: Julieanne Cotton, MD;  Location: [facility] OR;  Service: Radiology;  Laterality: Left;       Family History  Problem Relation Age of Onset  . Heart attack Father   . Stroke Paternal Great-grandfather     Social History   Tobacco Use  . Smoking status: Former Smoker    Types: Cigarettes    Quit date: 2003    Years since quitting: 19.3  . Smokeless tobacco: Never Used  Vaping Use  . Vaping Use: Never used  Substance Use Topics  . Alcohol use: Yes    Comment: occasionally. Not since stroke in February  . Drug use: No    Home Medications Prior to Admission medications   Medication Sig Start Date End Date Taking? Authorizing Provider  acetaminophen (TYLENOL) 500 MG tablet Take 1,000 mg by mouth every 6 (six) hours as needed for moderate pain or headache.    [provider]  aspirin 81 MG chewable tablet Chew 1 tablet (81 mg total) by mouth daily. 05/22/20   Louk, Waylan Boga, PA-C  atorvastatin (LIPITOR) 80 MG tablet Take 1 tablet (80 mg total) by mouth daily. 03/17/20   Azucena Fallen, MD  b complex vitamins capsule Take 1 capsule by mouth daily.    [provider]  clopidogrel (PLAVIX) 75 MG tablet Take 0.5 tablets (37.5 mg total) by mouth daily. 05/21/20 08/19/20  Baird Lyons, PA-C  hydrochlorothiazide (HYDRODIURIL) 25 MG tablet Take 1 tablet (25 mg total) by mouth daily. 03/18/20   Hughie Closs, MD  lisinopril (ZESTRIL) 40 MG tablet Take 40 mg by mouth daily.    [provider]  potassium chloride SA (KLOR-CON) 20 MEQ tablet Take 1 tablet (20 mEq total) by mouth daily. 03/22/20   Azucena Fallen, MD  traZODone (DESYREL) 50 MG tablet Take 50 mg by mouth at bedtime as needed for sleep. 04/28/20   [provider]  VITAMIN D-VITAMIN K PO Take 1 capsule by mouth daily.    [provider]    Allergies    Ciprodex [ciprofloxacin-dexamethasone] and Metformin and related  Review of Systems    Review of Systems  Constitutional: Positive for decreased appetite.  Psychiatric/Behavioral: Positive for confusion.  All other systems reviewed and are negative.   Physical Exam Updated Vital Signs SpO2 97%   Physical Exam Vitals and nursing note reviewed.  Constitutional:      Appearance: He is well-developed.  HENT:     Head: Normocephalic and atraumatic.  Eyes:     Conjunctiva/sclera: Conjunctivae normal.  Cardiovascular:     Rate and Rhythm: Normal rate and regular rhythm.     Pulses: Normal pulses.     Heart sounds: No murmur heard.   Pulmonary:     Effort: Pulmonary effort is normal. No respiratory distress.     Breath sounds: Normal breath sounds.  Abdominal:     Palpations: Abdomen is soft.     Tenderness: There is no abdominal tenderness.  Musculoskeletal:        General: Normal range of motion.     Cervical back: Neck supple.  Skin:    General: Skin is warm and dry.  Neurological:     Mental Status: He is alert. He is disoriented.  Psychiatric:        Mood and Affect: Mood normal.     ED Results / Procedures / Treatments   Labs (all labs ordered are listed, but only abnormal results are displayed) Labs Reviewed  CBC WITH DIFFERENTIAL/PLATELET - Abnormal; Notable for the following components:      Result Value   RBC 3.33 (*)    Hemoglobin 10.8 (*)    HCT 33.0 (*)    All other components within normal limits  CBG MONITORING, ED - Abnormal; Notable for the following components:   Glucose-Capillary 107 (*)    All other components within normal limits  COMPREHENSIVE METABOLIC PANEL  URINALYSIS, ROUTINE W REFLEX MICROSCOPIC  RAPID URINE DRUG SCREEN, HOSP PERFORMED    EKG None  Radiology CT Head Wo Contrast  Result Date: 05/31/2020 CLINICAL DATA:  70 year old sent from [facility] for evaluation of confusion of unknown duration. EXAM: CT HEAD WITHOUT CONTRAST TECHNIQUE: Contiguous axial images were obtained from the base of  the skull through the vertex without intravenous contrast. COMPARISON:  CT head 03/15/2020.  MRI brain 03/16/2020. FINDINGS: Brain: Geographic low attenuation involving the LEFT temporal lobe, LEFT posterior parietal lobe and the entire LEFT occipital lobe (within the distribution of the LEFT posterior cerebral artery and a portion of the LEFT middle cerebral artery) with sparing of a few peripheral gyri. The patient had a small stroke in this distribution on the prior MRI in February, 2022, but today the distribution involves nearly the entire occipital lobe and part of the posterior parietal lobe. There is a small focus of acute hemorrhage inferiorly and medially in the occipital lobe (series 2, image  14 and series 4, image 42). At this point, there is no significant mass effect or midline shift. Ventricular system normal in size and appearance for age. Stable mild age related cortical atrophy. Vascular: Moderate BILATERAL carotid siphon and LEFT vertebral artery atherosclerosis. Hyperdense LEFT posterior cerebral artery (2/13). Skull: No skull fracture or other focal osseous abnormality involving the skull. Sinuses/Orbits: Visualized paranasal sinuses, bilateral mastoid air cells and bilateral middle ear cavities well-aerated. Visualized orbits and globes normal in appearance. Other: None. IMPRESSION: 1. 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 2. Hyperdense LEFT posterior cerebral artery. 3. Very small associated acute hemorrhage in the inferomedial occipital lobe. 4. No significant mass effect or midline shift at this time. I telephoned these critical results at the time of interpretation on 05/31/2020 at 3:08 pm to provider [facility] , [facility], who verbally acknowledged these results. Electronically Signed   By: Hulan Saas  M.D.   On: 05/31/2020 15:13    Procedures Procedures   Medications Ordered in ED Medications - No data to display  ED Course  I have reviewed the triage vital signs and the nursing notes.  Pertinent labs & imaging results that were available during my care of the patient were reviewed by me and considered in my medical decision making (see chart for details).    MDM Rules/Calculators/A&P                          MDM:  I spoke to Neurology who will consult, probable subacute stroke.  Hospitalist  consulted and will admit  Final Clinical Impression(s) / ED Diagnoses Final diagnoses:  Cerebrovascular accident (CVA), unspecified mechanism (HCC)  Altered mental status, unspecified altered mental status type    Rx / DC Orders ED Discharge Orders    None       Osie Cheeks 05/31/20 1548    Margarita Grizzle, MD 06/02/20 1614

## 2020-05-31 NOTE — ED Notes (Signed)
Floor reports room is not clean

## 2020-05-31 NOTE — Progress Notes (Signed)
MRI called and requested for pt to have Chest xray and KUB before having MRI. [facility] notified.

## 2020-05-31 NOTE — BH Assessment (Signed)
Pt to [facility] with neighbor/friend with c/o of pt reporting SI. Pt very confused and unable to answer questions appropriately. Per neighbor pt had a stoke 3-4 months ago had a stent placed in his heart and some conflict with his daughter. Per neighbor pt had a significant decline in functioning. Pt answer to SI is "I don't know" and states that he will die someday. When asked about AVH pt reports "something is wrong".   Pt is urgent.

## 2020-05-31 NOTE — Consult Note (Signed)
NEUROLOGY CONSULTATION NOTE   Date of service: May 31, 2020 Patient Name: [name] MRN:  [id number] DOB:  November 01, 1950 Reason for consult: "L PCA/MCA stroke" Requesting Provider: Oren Binet* _ _ _   _ __   _ __ _ _  __ __   _ __   __ _  History of Present Illness  [name] is a 70 y.o. 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 He presents with increasing confusion for the last couple of days with decreased po intake.  He was initially seen in [facility] and then immediately transferred to the ED where a CTH without contrast demonstrated a large left parietal occipital infarct.  History is limited due to tangential speech and some difficulty organizing his thoughts.  He thinks he has been symptomatic for for 5 days.     ROS   Constitutional Denies weight loss, fever and chills.   HEENT Denies changes in vision and hearing.   Respiratory Denies SOB and cough.   CV Denies palpitations and CP   GI Denies abdominal pain, nausea, vomiting and diarrhea.   GU Denies dysuria and urinary frequency.   MSK Denies myalgia and joint pain.   Skin Denies rash and pruritus.   Neurological Denies headache and syncope.   Psychiatric Denies recent changes in mood. Denies anxiety and depression.    Past History   Past Medical History:  Diagnosis Date  . Hypertension   . Myocardial infarction ([place])    1990's  . Stroke [facility]) 03/15/2020   Past Surgical History:  Procedure Laterality Date  . IR ANGIO INTRA EXTRACRAN SEL COM CAROTID INNOMINATE BILAT MOD SED  05/02/2020  . IR ANGIO VERTEBRAL SEL SUBCLAVIAN INNOMINATE UNI L MOD SED  05/02/2020  . IR ANGIO VERTEBRAL SEL SUBCLAVIAN INNOMINATE UNI R MOD SED  05/20/2020  . IR ANGIO VERTEBRAL SEL VERTEBRAL UNI R MOD SED  05/02/2020  . IR INTRAVSC STENT CERV  CAROTID W/EMB-PROT MOD SED INCL ANGIO  05/20/2020  . IR RADIOLOGIST EVAL & MGMT  03/27/2020  . IR US GUIDE VASC ACCESS RIGHT  05/02/2020  . IR US GUIDE VASC ACCESS RIGHT  05/20/2020  . RADIOLOGY WITH ANESTHESIA Left 05/20/2020   Procedure: RADIOLOGY WITH ANESTHESIA  LEFT ICA STENTING;  Surgeon: Luanne Bras, MD;  Location: [facility];  Service: Radiology;  Laterality: Left;   Family History  Problem Relation Age of Onset  . Heart attack Father   . Stroke Paternal Great-grandfather    Social History   Socioeconomic History  . Marital status: Single    Spouse name: Not on file  . Number of children: Not on file  . Years of education: Not on file  . Highest education level: Not on file  Occupational History  . Not on file  Tobacco Use  . Smoking status: Former Smoker    Types: Cigarettes    Quit date: 2003    Years since quitting: 19.3  . Smokeless tobacco: Never Used  Vaping Use  . Vaping Use: Never used  Substance and Sexual Activity  . Alcohol use: Yes    Comment: occasionally. Not since stroke in February  . Drug use: No  . Sexual activity: Not on file  Other Topics Concern  . Not on file  Social History Narrative   Lives with  daughter (temporary after stroke)   Right handed   Drinks 1-2 cups caffeine daily   Social Determinants of Health   Financial Resource Strain: Not on file  Food Insecurity: Not on file  Transportation Needs: Not on file  Physical Activity: Not on file  Stress: Not on file  Social Connections: Not on file   Allergies  Allergen Reactions  . Ciprodex [Ciprofloxacin-Dexamethasone] Swelling    Ear canal swelling  . Metformin And Related Other (See Comments)    Upset stomach    Medications   Medications Prior to Admission  Medication Sig Dispense Refill Last Dose  . acetaminophen (TYLENOL) 500 MG tablet Take 1,000 mg by mouth every 6 (six) hours as needed for moderate pain or headache.   unknown  . aspirin 81 MG chewable tablet Chew 1 tablet  (81 mg total) by mouth daily. 90 tablet 1 05/30/2020 at Unknown time  . atorvastatin (LIPITOR) 80 MG tablet Take 1 tablet (80 mg total) by mouth daily. 30 tablet 0 05/30/2020 at Unknown time  . b complex vitamins capsule Take 1 capsule by mouth daily.   05/30/2020 at Unknown time  . clopidogrel (PLAVIX) 75 MG tablet Take 0.5 tablets (37.5 mg total) by mouth daily. 30 tablet 2 05/30/2020 at Unknown time  . hydrochlorothiazide (HYDRODIURIL) 25 MG tablet Take 1 tablet (25 mg total) by mouth daily.   05/30/2020 at Unknown time  . lisinopril (ZESTRIL) 40 MG tablet Take 40 mg by mouth daily.   05/30/2020 at Unknown time  . potassium chloride SA (KLOR-CON) 20 MEQ tablet Take 1 tablet (20 mEq total) by mouth daily.   05/30/2020 at Unknown time  . traZODone (DESYREL) 50 MG tablet Take 50 mg by mouth at bedtime as needed for sleep.   unknown  . VITAMIN D-VITAMIN K PO Take 1 capsule by mouth daily.   05/30/2020 at Unknown time     Vitals   Vitals:   05/31/20 1630 05/31/20 1715 05/31/20 1742 05/31/20 1813  BP: 115/64 121/62 121/62 (!) 109/94  Pulse:  67 70 66  Resp: 15 14 17    Temp:    98.2 F (36.8 C)  TempSrc:    Oral  SpO2:  99% 99% 100%     There is no height or weight on file to calculate BMI.  Physical Exam   General: Laying comfortably in bed; in no acute distress.  HENT: Normal oropharynx and mucosa. Normal external appearance of ears and nose.  Neck: Supple, no pain or tenderness  CV: No JVD. No peripheral edema.  Pulmonary: Symmetric Chest rise. Normal respiratory effort.  Abdomen: Soft to touch, non-tender.  Ext: No cyanosis, edema, or deformity  Skin: No rash. Normal palpation of skin.   Musculoskeletal: Normal digits and nails by inspection. No clubbing.   Neurologic Examination  Mental status/Cognition: Alert, oriented to self, place, when asked his age, he counts on his fingers and then says"[age] and then a 9".  Speech/language: He has some degree of fluent aphasia, oftentimes will  get derailed in the middle of a statement.  When I ask him to stick out his tongue, he opens his mouth but does not protrude his tongue until I give him a visual aid.  He is able to answer most questions to some degree. Cranial nerves:   CN II Pupils equal and reactive to light, he has a dense right hemianopia   CN III,IV,VI EOM intact, no gaze preference or deviation, no nystagmus    CN V normal  sensation in V1, V2, and V3 segments bilaterally    CN VII no asymmetry, no nasolabial fold flattening    CN VIII normal hearing to speech    CN IX & X normal palatal elevation, no uvular deviation    CN XI 5/5 head turn and 5/5 shoulder shrug bilaterally    CN XII midline tongue protrusion    Motor:  Normal tone, 5/5 throughout   Sensation:  Light touch    Pin prick    Temperature    Vibration   Proprioception    Coordination/Complex Motor:  - Finger to Nose normal bilaterally Labs   CBC:  Recent Labs  Lab 05/31/20 1411  WBC 8.5  NEUTROABS 6.7  HGB 10.8*  HCT 33.0*  MCV 99.1  PLT 863    Basic Metabolic Panel:  Lab Results  Component Value Date   NA 135 05/31/2020   K 4.0 05/31/2020   CO2 28 05/31/2020   GLUCOSE 111 (H) 05/31/2020   BUN 35 (H) 05/31/2020   CREATININE 1.52 (H) 05/31/2020   CALCIUM 10.5 (H) 05/31/2020   GFRNONAA 49 (L) 05/31/2020   GFRAA  08/12/2008    >60        The eGFR has been calculated using the MDRD equation. This calculation has not been validated in all clinical situations. eGFR's persistently <60 mL/min signify possible Chronic Kidney Disease.   Lipid Panel:  Lab Results  Component Value Date   LDLCALC 129 (H) 03/16/2020   HgbA1c:  Lab Results  Component Value Date   HGBA1C 6.5 (H) 03/16/2020   Urine Drug Screen:     Component Value Date/Time   LABOPIA NONE DETECTED 05/31/2020 1458   COCAINSCRNUR NONE DETECTED 05/31/2020 1458   LABBENZ NONE DETECTED 05/31/2020 1458   AMPHETMU NONE DETECTED 05/31/2020 1458   THCU NONE  DETECTED 05/31/2020 1458   LABBARB NONE DETECTED 05/31/2020 1458    Alcohol Level No results found for: [place]  CT Head without contrast: Large left parieto-occipital infarct with small amount of petechial hemorrhage  MR Angio head and neck: Pending  Impression   [name] is a 70 y.o. male with recent left ICA stent who presents with embolic appearing ICA infarct.  He does have a little bit of hemorrhage, however given his recent stent I would be hesitant to stop his dual antiplatelet therapy.  He is on a lower dose of Plavix,?  Due to groin hematoma, and I would continue this for now.   Primary Diagnosis:  Cerebral infarction due to embolism of  left middle cerebral artery.   Secondary Diagnosis: Essential (primary) hypertension  Recommendations  - HgbA1c, fasting lipid panel - MRA head and neck - Frequent neuro checks - Echocardiogram - MRI brain - Prophylactic therapy-Antiplatelet med: Aspirin - 12m and plavix 37.5 mgdaily.  - Risk factor modification - Telemetry monitoring - PT consult, OT consult, Speech consult - Stroke team to follow  ______________________________________________________________________   Thank you for the opportunity to take part in the care of this patient. If you have any further questions, please contact the neurology consultation attending.  Signed,  MRoland Rack MD [facility] [phone]  _ _ _   _ __   _ __ _ _  __ __   _ __   __ _

## 2020-05-31 NOTE — ED Notes (Signed)
Family friend berkley updated at this time. And voicemail left for daughter christy rabon [phone]

## 2020-05-31 NOTE — Discharge Instructions (Addendum)
Discharge to [facility] 

## 2020-05-31 NOTE — ED Notes (Signed)
Daughter updated on pts admission status

## 2020-05-31 NOTE — ED Notes (Signed)
Pt transferred to [facility] via non-emergent EMS for continuum of care. Safety maintained.

## 2020-05-31 NOTE — ED Triage Notes (Signed)
Pt BIB ems from [facility]; pt dropped off by neighbor, concern for early onset dementia; [facility] sent pt here for further evaluation; unsure of baseline, but pt presents as confused (disoriented time, place, event); pt has no complaints; follows commands; VSS  120/70 HR 80 regular RR 18 96% RA cbg 126

## 2020-06-01 ENCOUNTER — Inpatient Hospital Stay (HOSPITAL_COMMUNITY): Payer: Medicare HMO

## 2020-06-01 ENCOUNTER — Encounter (HOSPITAL_COMMUNITY): Payer: Self-pay | Admitting: Internal Medicine

## 2020-06-01 DIAGNOSIS — I63312 Cerebral infarction due to thrombosis of left middle cerebral artery: Secondary | ICD-10-CM

## 2020-06-01 DIAGNOSIS — I63532 Cerebral infarction due to unspecified occlusion or stenosis of left posterior cerebral artery: Secondary | ICD-10-CM

## 2020-06-01 DIAGNOSIS — I63512 Cerebral infarction due to unspecified occlusion or stenosis of left middle cerebral artery: Secondary | ICD-10-CM

## 2020-06-01 LAB — BASIC METABOLIC PANEL
Anion gap: 6 (ref 5–15)
BUN: 31 mg/dL — ABNORMAL HIGH (ref 8–23)
CO2: 28 mmol/L (ref 22–32)
Calcium: 9.6 mg/dL (ref 8.9–10.3)
Chloride: 102 mmol/L (ref 98–111)
Creatinine, Ser: 1.19 mg/dL (ref 0.61–1.24)
GFR, Estimated: >60 mL/min (ref >60)
Glucose, Bld: 109 mg/dL — ABNORMAL HIGH (ref 70–99)
Potassium: 3.5 mmol/L (ref 3.5–5.1)
Sodium: 136 mmol/L (ref 135–145)

## 2020-06-01 LAB — LIPID PANEL
Cholesterol: 92 mg/dL (ref 0–200)
HDL: 42 mg/dL (ref >40)
LDL Cholesterol: 42 mg/dL (ref 0–99)
Total CHOL/HDL Ratio: 2.2 RATIO
Triglycerides: 40 mg/dL (ref <150)
VLDL: 8 mg/dL (ref 0–40)

## 2020-06-01 LAB — CBC
HCT: 30.3 % — ABNORMAL LOW (ref 39.0–52.0)
Hemoglobin: 9.9 g/dL — ABNORMAL LOW (ref 13.0–17.0)
MCH: 31.8 pg (ref 26.0–34.0)
MCHC: 32.7 g/dL (ref 30.0–36.0)
MCV: 97.4 fL (ref 80.0–100.0)
Platelets: 371 10*3/uL (ref 150–400)
RBC: 3.11 MIL/uL — ABNORMAL LOW (ref 4.22–5.81)
RDW: 14.6 % (ref 11.5–15.5)
WBC: 6.5 10*3/uL (ref 4.0–10.5)
nRBC: 0 % (ref 0.0–0.2)

## 2020-06-01 LAB — PLATELET INHIBITION P2Y12: Platelet Function  P2Y12: 135 [PRU] — ABNORMAL LOW (ref 182–335)

## 2020-06-01 LAB — HEMOGLOBIN A1C
Hgb A1c MFr Bld: 5.9 % — ABNORMAL HIGH (ref 4.8–5.6)
Mean Plasma Glucose: 122.63 mg/dL

## 2020-06-01 IMAGING — MR MR MRA NECK WO/W CM
4 of 5 series · 22 of 48 positions shown · IV contrast (Gadavist)
Comparison: Prior studies from [DATE] as well as earlier.

CLINICAL DATA: Follow-up examination for acute stroke.

EXAM:
MRI HEAD WITHOUT CONTRAST
MRA HEAD WITHOUT CONTRAST
MRA NECK WITHOUT AND WITH CONTRAST
TECHNIQUE: Multiplanar, multi-echo pulse sequences of the brain and surrounding
structures were acquired without intravenous contrast. Angiographic
images of the Circle of Willis were acquired using MRA technique
without intravenous contrast. Angiographic images of the neck were
acquired using MRA technique without and with intravenous contrast.
Carotid stenosis measurements (when applicable) are obtained
utilizing NASCET criteria, using the distal internal carotid
diameter as the denominator.
CONTRAST:  8.5mL GADAVIST GADOBUTROL 1 MMOL/ML IV SOLN

[Series 6: tof_fl3d_tra_iso · axial · 0.6mm · 0.52mm/px · z∈[-192,-115]mm · 9 of 133 slices shown]
[im 1/133]
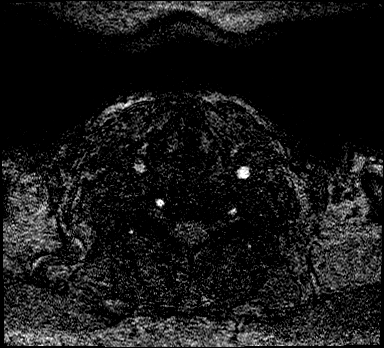
[im 23/133]
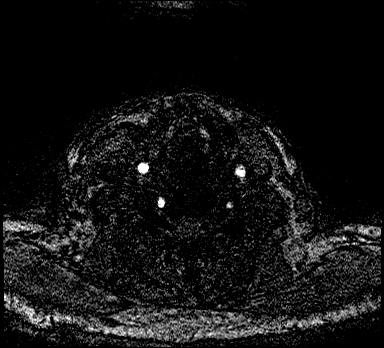
[im 45/133]
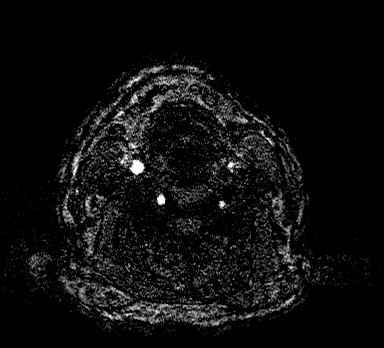
[im 56/133]
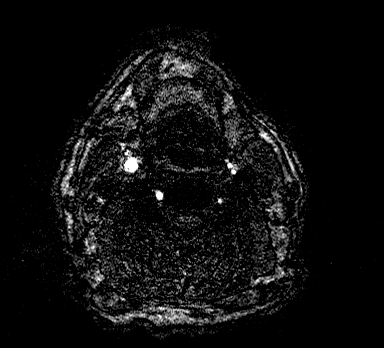
[im 67/133]
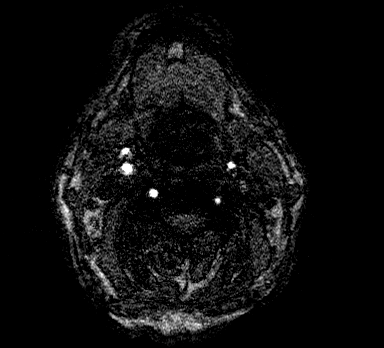
[im 78/133]
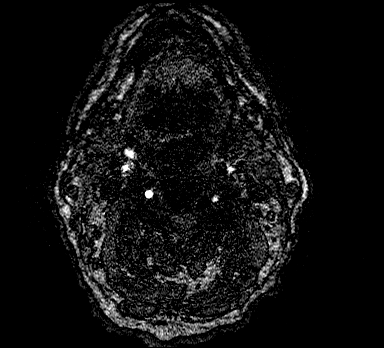
[im 89/133]
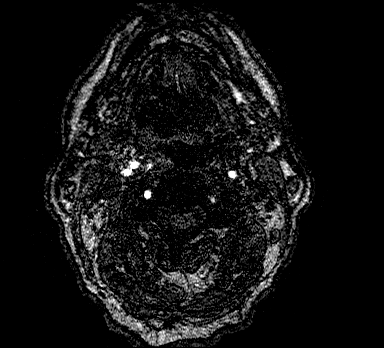
[im 111/133]
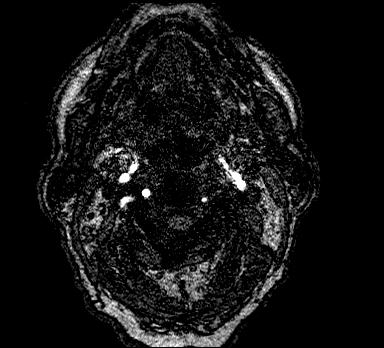
[im 133/133]
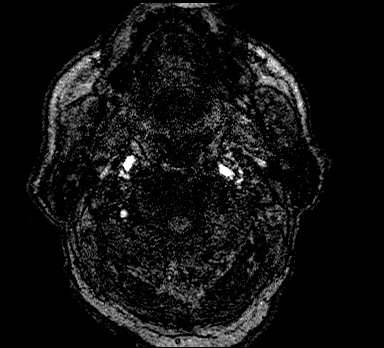

[Series 10: angio_fl3d_cor_highres_pre_ttc=3.0s · coronal · 0.9mm · 0.62mm/px · 7 of 88 slices shown]
[im 1/88]
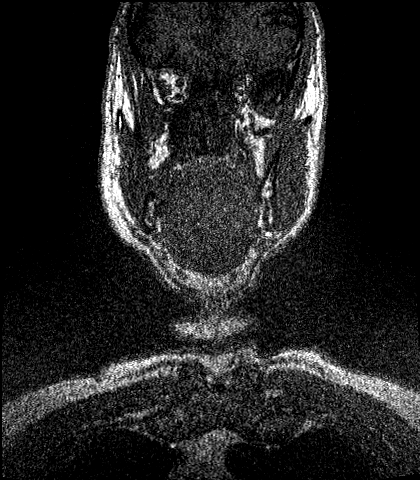
[im 11/88]
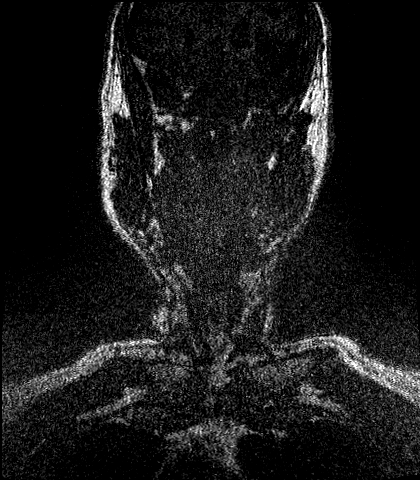
[im 22/88]
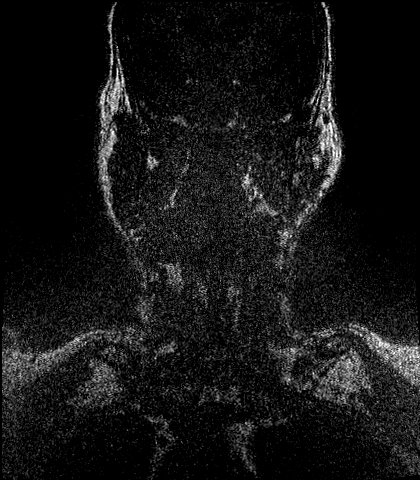
[im 33/88]
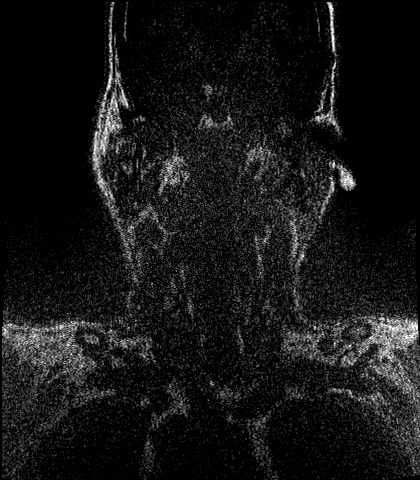
[im 44/88]
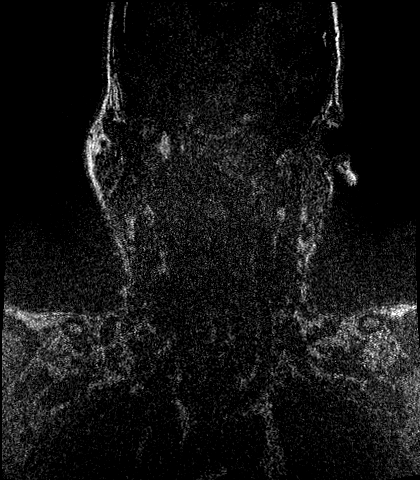
[im 55/88]
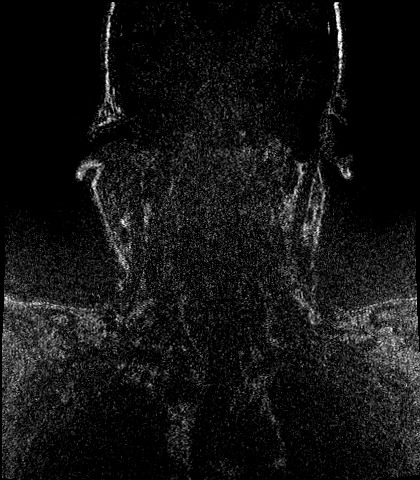
[im 77/88]
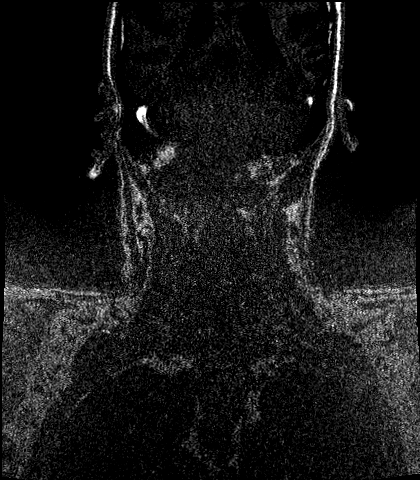

[Series 12: angio_fl3d_cor_highres_post_ttc=3.0s · coronal · 0.9mm · 0.62mm/px · 3 of 88 slices shown]
[im 11/88]
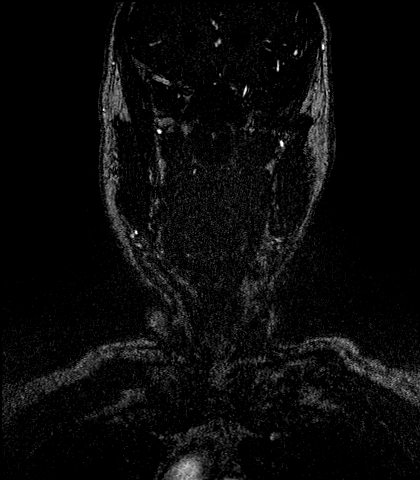
[im 44/88]
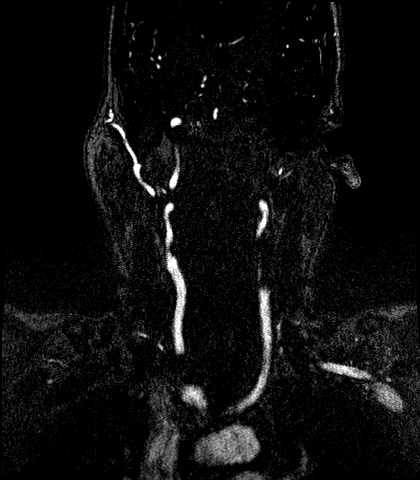
[im 77/88]
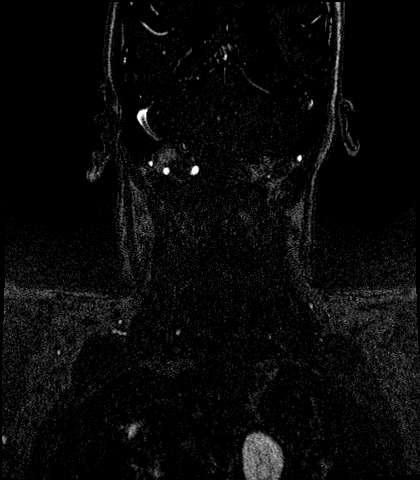

[Series 13: angio_fl3d_cor_highres_post_ttc=3.0s_moco-adv · coronal · 0.9mm · 0.62mm/px · 3 of 88 slices shown]
[im 11/88]
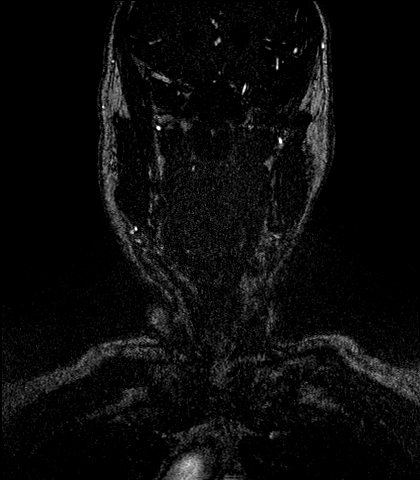
[im 44/88]
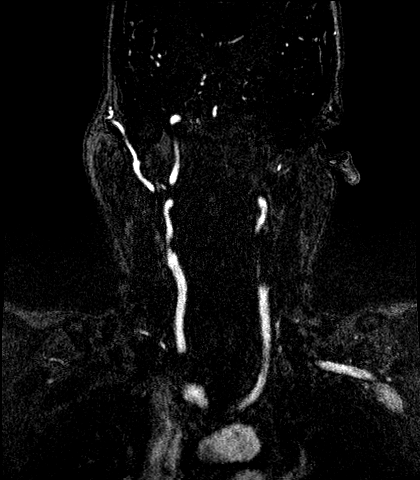
[im 77/88]
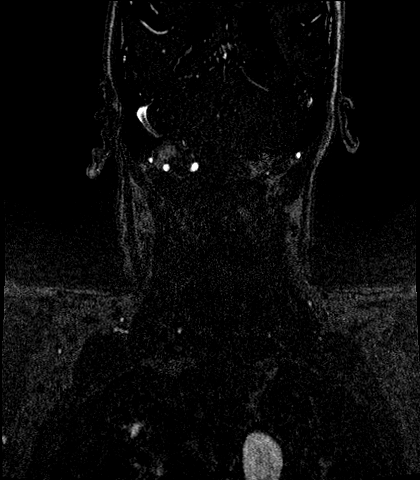

[22 of 48 positions shown; findings below may reference images not displayed]

FINDINGS: MRI HEAD FINDINGS

Brain: Generalized age-related cerebral atrophy. There has been
interval evolution of previously identified left PCA distribution
infarct, now late subacute to chronic in appearance. There are
superimposed scattered areas of more acute appearing diffusion
abnormality involving the left temporal occipital region,
corresponding with abnormality on most recent CT. Associated
hyperintense T2/FLAIR signal corresponds with the areas of diffusion
abnormality, suggesting acute to early subacute ischemic change.
Minimal patchy involvement of the left basal ganglia noted. Few
scattered serpiginous foci of susceptibility artifact and T1
hyperintensity seen, consistent with mild petechial blood products
and/or laminar necrosis. The preponderance of this finding is
associated with the more chronic left PCA distribution infarct. No
frank hemorrhagic transformation or significant regional mass
effect.

No other areas of acute or subacute infarction elsewhere within the
brain. Gray-white matter differentiation otherwise maintained. No
mass lesion or significant mass effect. No midline shift or
hydrocephalus. No extra-axial fluid collection. Pituitary gland
suprasellar region normal. Midline structures intact.

Vascular: Irregular abnormal flow void within the left V4 segment,
consistent with slow flow and/or occlusion (series 10, image 2).
Left PCA is likely occluded. Major intracranial vascular flow voids
are otherwise maintained.

Skull and upper cervical spine: Craniocervical junction within
normal limits. Bone marrow signal intensity normal. No scalp soft
tissue abnormality.

Sinuses/Orbits: Globes and orbital soft tissues demonstrate no acute
finding. Paranasal sinuses are largely clear. No significant mastoid
effusion. Inner ear structures grossly normal.

Other: None.

MRA HEAD FINDINGS

ANTERIOR CIRCULATION:

Visualized distal cervical segments of both internal carotid
arteries are patent with antegrade flow. Petrous, cavernous, and
supraclinoid segments patent without flow-limiting stenosis. 2 mm
focal outpouching extending laterally and slightly posteriorly from
the cavernous left ICA likely reflects a small vascular
infundibulum, seen on prior arteriogram (series 1, image 95). A1
segments patent bilaterally. Normal anterior communicating artery
complex. Both ACAs patent to their distal aspects without stenosis.

Short-segment severe mid left M1 stenosis (series [LH], image 11).
Mild to moderate stenosis at the distal right M1 segment (series
[LH], image 10). Normal MCA bifurcations. MCA branches well perfused
although demonstrate diffuse small vessel atheromatous irregularity.

POSTERIOR CIRCULATION:

Right V4 segment mildly irregular but patent without significant
stenosis by MRA. Severe stenosis involving the intracranial left V4
segment with markedly attenuated flow. There remains some persistent
antegrade flow to the vertebrobasilar junction. Both PICA origins
remain patent and well perfused. Moderate segmental stenoses
involving the proximal and mid basilar artery, stable. Basilar
widely patent distally. Superior cerebellar arteries patent
bilaterally. Right PCA supplied via the basilar as well as a small
right posterior communicating artery. Severe stenosis at the distal
right P2/P3 junction (series [LH], image 16). Right PCA patent
distally. There is occlusion of the left PCA at the proximal left P2
segment. A small left PCOM remains patent.

MRA NECK FINDINGS

AORTIC ARCH: Visualized aortic arch normal in caliber. Bovine
branching pattern noted. No stenosis about the origin of the great
vessels.

RIGHT CAROTID SYSTEM: Right CCA patent to the bifurcation. No
significant atheromatous irregularity or stenosis about the right
carotid bulb. Right ICA patent distally without stenosis, evidence
for dissection or occlusion.

LEFT CAROTID SYSTEM: Left common carotid artery patent from its
origin to the region of the left carotid bulb. Abrupt signal loss
spanning the left carotid bulb through the proximal left ICA related
to a vascular stent. Area of signal loss measures 3.5 cm in
craniocaudad dimension. Evaluation for potential intra stent
stenosis limited by MRA, although robust flow seen within the ICA
distal to the stent. No other stenosis, evidence for dissection or
occlusion.

VERTEBRAL ARTERIES: Both vertebral arteries arise from the
subclavian arteries. Focal tortuosity with short-segment of
approximately 50% stenosis at the proximal right subclavian artery,
proximal to the takeoff of the right vertebral artery (series [LH],
image 11), similar to prior CTA. Relatively mild 30% atheromatous
stenosis at the origins of the vertebral arteries not significantly
changed. Vertebral arteries otherwise patent within the neck without
stenosis, evidence for dissection or occlusion.
IMPRESSION: MRI HEAD IMPRESSION:

1. Acute to early subacute ischemic infarct involving the left
temporoccipital region, corresponding with abnormality seen on most
recent CT. Mild patchy involvement at the left basal ganglia. No
frank hemorrhagic transformation or significant regional mass
effect.
2. Interval evolution of superimposed/underlying left PCA
distribution infarct, now late subacute to chronic in appearance.
Associated susceptibility artifact consistent with petechial blood
products and/or laminar necrosis.

MRA HEAD IMPRESSION:

1. Chronic occlusion of the left PCA at the proximal left P2
segment.
2. Moderate segmental stenoses involving the proximal and mid
basilar artery, with severe stenosis at the distal right P2/P3
junction.
3. Short-segment severe mid left M1 stenosis, with short-segment
mild to moderate distal right M1 stenosis.
4. Distal small vessel atheromatous irregularity throughout the
intracranial circulation.

MRA NECK IMPRESSION:

1. Abrupt signal loss spanning the left carotid bulb through the
proximal left ICA related to a vascular stent. Evaluation for
potential intra stent stenosis limited by MRA, although robust flow
seen within the ICA distal to the stent.
2. Wide patency of the right carotid artery system without stenosis.
3. Relatively mild 30% atheromatous stenosis at the origins of the
vertebral arteries.
4. Short-segment 50% stenosis involving the proximal right
subclavian artery, proximal to the takeoff of the right vertebral
artery.

## 2020-06-01 IMAGING — MR MR MRA HEAD W/O CM
1 series · 15 of 48 positions shown · IV contrast (gadavist)
Comparison: Prior studies from [DATE] as well as earlier.

CLINICAL DATA: Follow-up examination for acute stroke.

EXAM:
MRI HEAD WITHOUT CONTRAST
MRA HEAD WITHOUT CONTRAST
MRA NECK WITHOUT AND WITH CONTRAST
TECHNIQUE: Multiplanar, multi-echo pulse sequences of the brain and surrounding
structures were acquired without intravenous contrast. Angiographic
images of the Circle of Willis were acquired using MRA technique
without intravenous contrast. Angiographic images of the neck were
acquired using MRA technique without and with intravenous contrast.
Carotid stenosis measurements (when applicable) are obtained
utilizing NASCET criteria, using the distal internal carotid
diameter as the denominator.
CONTRAST:  8.5mL GADAVIST GADOBUTROL 1 MMOL/ML IV SOLN

[Series 1: 3d cow · axial · 0.5mm · 0.41mm/px · z∈[-87,+3]mm · 15 of 192 slices shown]
[im 1/192]
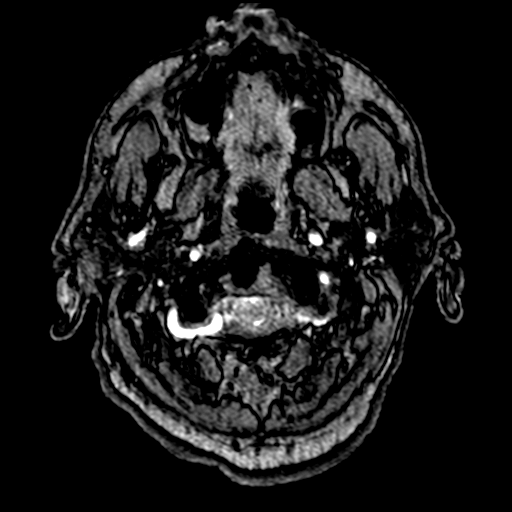
[im 5/192]
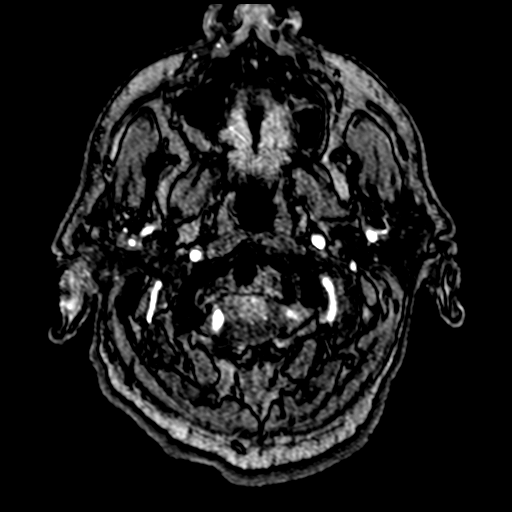
[im 9/192]
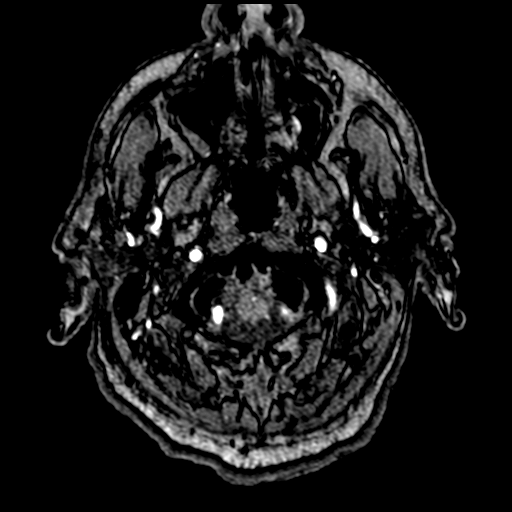
[im 13/192]
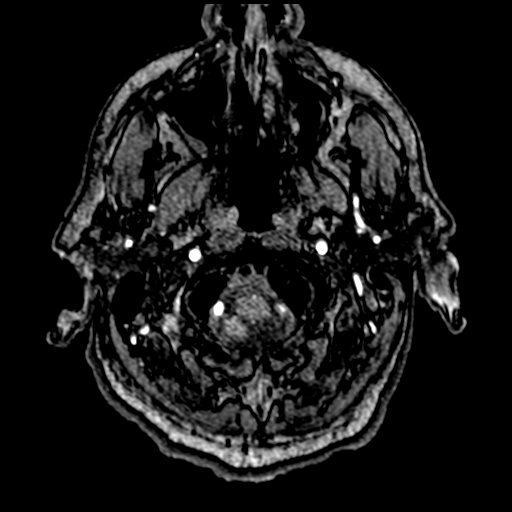
[im 17/192]
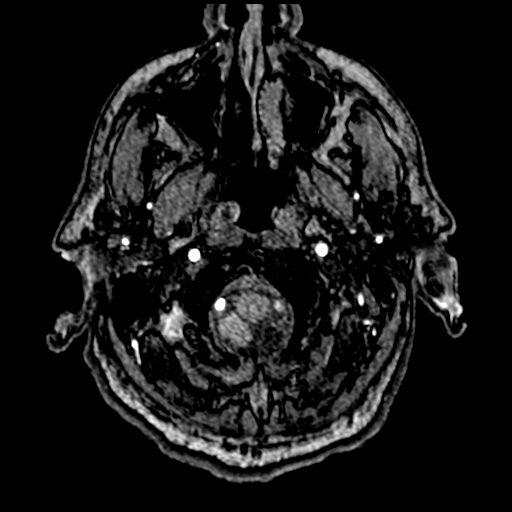
[im 33/192]
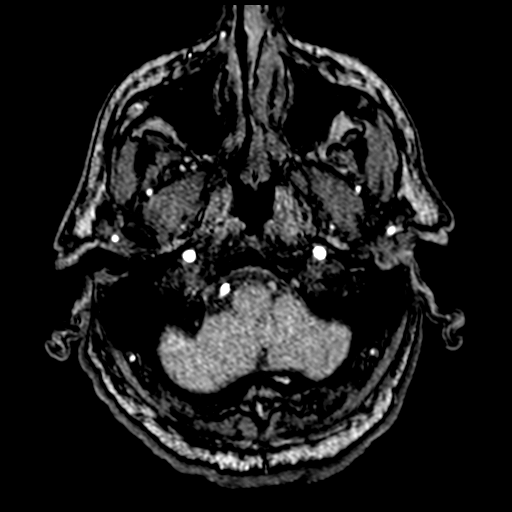
[im 37/192]
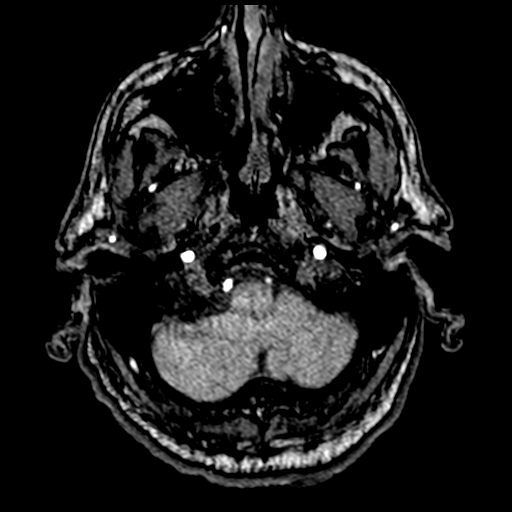
[im 61/192]
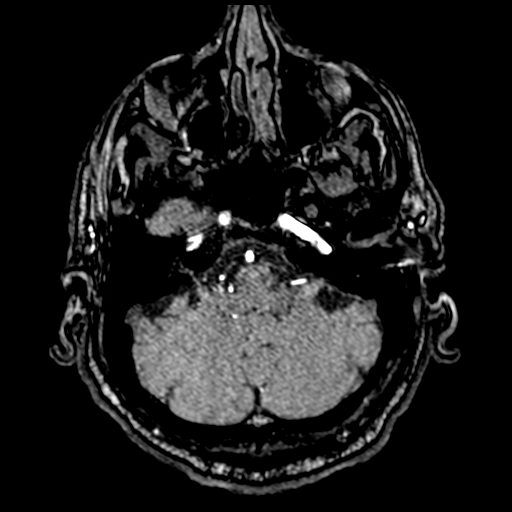
[im 86/192]
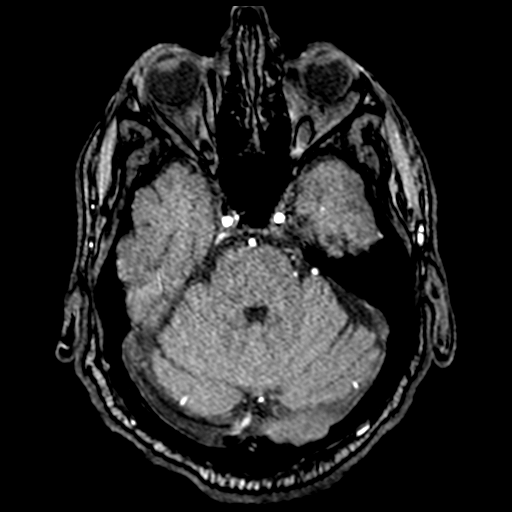
[im 98/192]
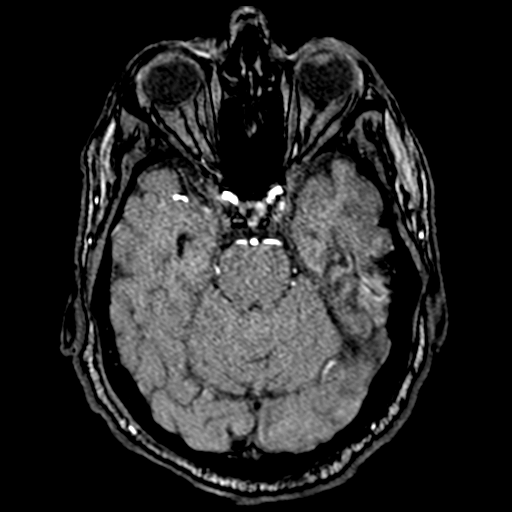
[im 110/192]
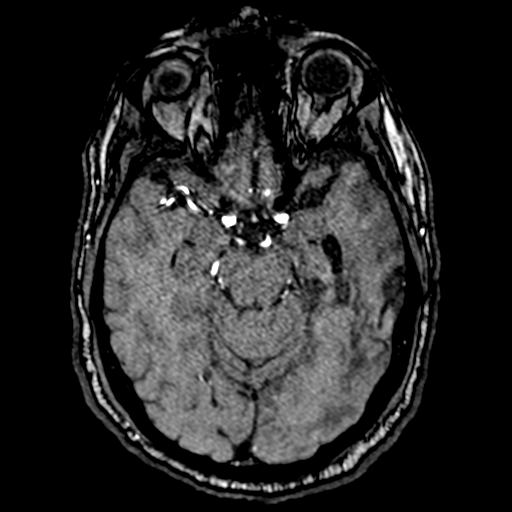
[im 135/192]
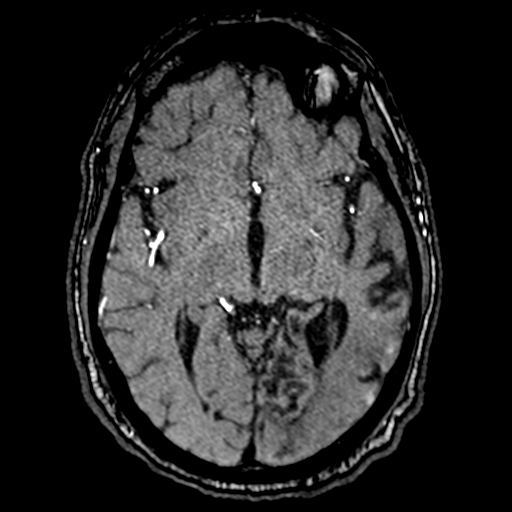
[im 159/192]
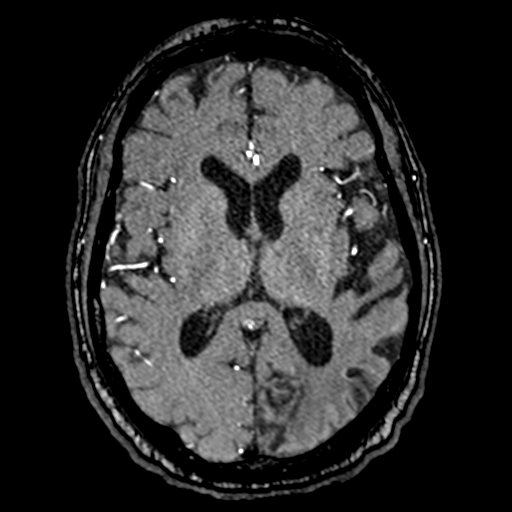
[im 163/192]
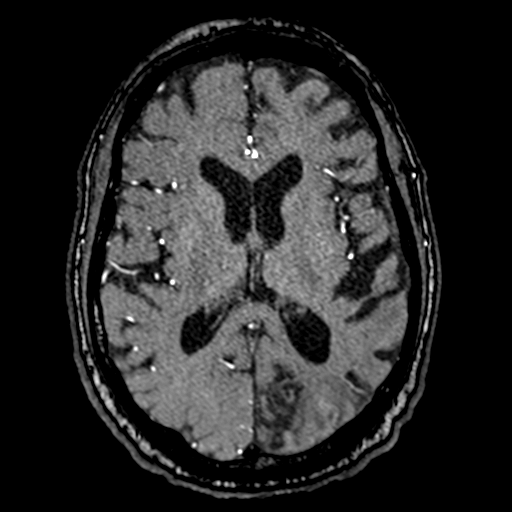
[im 183/192]
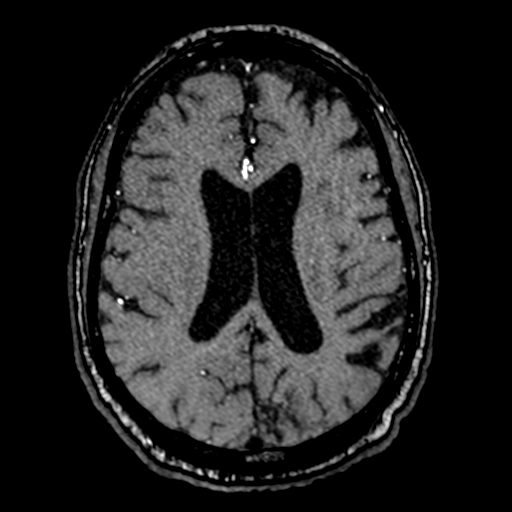

[15 of 48 positions shown; findings below may reference images not displayed]

FINDINGS: MRI HEAD FINDINGS

Brain: Generalized age-related cerebral atrophy. There has been
interval evolution of previously identified left PCA distribution
infarct, now late subacute to chronic in appearance. There are
superimposed scattered areas of more acute appearing diffusion
abnormality involving the left temporal occipital region,
corresponding with abnormality on most recent CT. Associated
hyperintense T2/FLAIR signal corresponds with the areas of diffusion
abnormality, suggesting acute to early subacute ischemic change.
Minimal patchy involvement of the left basal ganglia noted. Few
scattered serpiginous foci of susceptibility artifact and T1
hyperintensity seen, consistent with mild petechial blood products
and/or laminar necrosis. The preponderance of this finding is
associated with the more chronic left PCA distribution infarct. No
frank hemorrhagic transformation or significant regional mass
effect.

No other areas of acute or subacute infarction elsewhere within the
brain. Gray-white matter differentiation otherwise maintained. No
mass lesion or significant mass effect. No midline shift or
hydrocephalus. No extra-axial fluid collection. Pituitary gland
suprasellar region normal. Midline structures intact.

Vascular: Irregular abnormal flow void within the left V4 segment,
consistent with slow flow and/or occlusion (series 10, image 2).
Left PCA is likely occluded. Major intracranial vascular flow voids
are otherwise maintained.

Skull and upper cervical spine: Craniocervical junction within
normal limits. Bone marrow signal intensity normal. No scalp soft
tissue abnormality.

Sinuses/Orbits: Globes and orbital soft tissues demonstrate no acute
finding. Paranasal sinuses are largely clear. No significant mastoid
effusion. Inner ear structures grossly normal.

Other: None.

MRA HEAD FINDINGS

ANTERIOR CIRCULATION:

Visualized distal cervical segments of both internal carotid
arteries are patent with antegrade flow. Petrous, cavernous, and
supraclinoid segments patent without flow-limiting stenosis. 2 mm
focal outpouching extending laterally and slightly posteriorly from
the cavernous left ICA likely reflects a small vascular
infundibulum, seen on prior arteriogram (series 1, image 95). A1
segments patent bilaterally. Normal anterior communicating artery
complex. Both ACAs patent to their distal aspects without stenosis.

Short-segment severe mid left M1 stenosis (series [LH], image 11).
Mild to moderate stenosis at the distal right M1 segment (series
[LH], image 10). Normal MCA bifurcations. MCA branches well perfused
although demonstrate diffuse small vessel atheromatous irregularity.

POSTERIOR CIRCULATION:

Right V4 segment mildly irregular but patent without significant
stenosis by MRA. Severe stenosis involving the intracranial left V4
segment with markedly attenuated flow. There remains some persistent
antegrade flow to the vertebrobasilar junction. Both PICA origins
remain patent and well perfused. Moderate segmental stenoses
involving the proximal and mid basilar artery, stable. Basilar
widely patent distally. Superior cerebellar arteries patent
bilaterally. Right PCA supplied via the basilar as well as a small
right posterior communicating artery. Severe stenosis at the distal
right P2/P3 junction (series [LH], image 16). Right PCA patent
distally. There is occlusion of the left PCA at the proximal left P2
segment. A small left PCOM remains patent.

MRA NECK FINDINGS

AORTIC ARCH: Visualized aortic arch normal in caliber. Bovine
branching pattern noted. No stenosis about the origin of the great
vessels.

RIGHT CAROTID SYSTEM: Right CCA patent to the bifurcation. No
significant atheromatous irregularity or stenosis about the right
carotid bulb. Right ICA patent distally without stenosis, evidence
for dissection or occlusion.

LEFT CAROTID SYSTEM: Left common carotid artery patent from its
origin to the region of the left carotid bulb. Abrupt signal loss
spanning the left carotid bulb through the proximal left ICA related
to a vascular stent. Area of signal loss measures 3.5 cm in
craniocaudad dimension. Evaluation for potential intra stent
stenosis limited by MRA, although robust flow seen within the ICA
distal to the stent. No other stenosis, evidence for dissection or
occlusion.

VERTEBRAL ARTERIES: Both vertebral arteries arise from the
subclavian arteries. Focal tortuosity with short-segment of
approximately 50% stenosis at the proximal right subclavian artery,
proximal to the takeoff of the right vertebral artery (series [LH],
image 11), similar to prior CTA. Relatively mild 30% atheromatous
stenosis at the origins of the vertebral arteries not significantly
changed. Vertebral arteries otherwise patent within the neck without
stenosis, evidence for dissection or occlusion.
IMPRESSION: MRI HEAD IMPRESSION:

1. Acute to early subacute ischemic infarct involving the left
temporoccipital region, corresponding with abnormality seen on most
recent CT. Mild patchy involvement at the left basal ganglia. No
frank hemorrhagic transformation or significant regional mass
effect.
2. Interval evolution of superimposed/underlying left PCA
distribution infarct, now late subacute to chronic in appearance.
Associated susceptibility artifact consistent with petechial blood
products and/or laminar necrosis.

MRA HEAD IMPRESSION:

1. Chronic occlusion of the left PCA at the proximal left P2
segment.
2. Moderate segmental stenoses involving the proximal and mid
basilar artery, with severe stenosis at the distal right P2/P3
junction.
3. Short-segment severe mid left M1 stenosis, with short-segment
mild to moderate distal right M1 stenosis.
4. Distal small vessel atheromatous irregularity throughout the
intracranial circulation.

MRA NECK IMPRESSION:

1. Abrupt signal loss spanning the left carotid bulb through the
proximal left ICA related to a vascular stent. Evaluation for
potential intra stent stenosis limited by MRA, although robust flow
seen within the ICA distal to the stent.
2. Wide patency of the right carotid artery system without stenosis.
3. Relatively mild 30% atheromatous stenosis at the origins of the
vertebral arteries.
4. Short-segment 50% stenosis involving the proximal right
subclavian artery, proximal to the takeoff of the right vertebral
artery.

## 2020-06-01 IMAGING — MR MR HEAD W/O CM
11 of 13 series · 37 of 48 positions shown · IV contrast (gadavist)
Comparison: Prior studies from [DATE] as well as earlier.

CLINICAL DATA: Follow-up examination for acute stroke.

EXAM:
MRI HEAD WITHOUT CONTRAST
MRA HEAD WITHOUT CONTRAST
MRA NECK WITHOUT AND WITH CONTRAST
TECHNIQUE: Multiplanar, multi-echo pulse sequences of the brain and surrounding
structures were acquired without intravenous contrast. Angiographic
images of the Circle of Willis were acquired using MRA technique
without intravenous contrast. Angiographic images of the neck were
acquired using MRA technique without and with intravenous contrast.
Carotid stenosis measurements (when applicable) are obtained
utilizing NASCET criteria, using the distal internal carotid
diameter as the denominator.
CONTRAST:  8.5mL GADAVIST GADOBUTROL 1 MMOL/ML IV SOLN

[Series 5: DWI · axial · 3.0mm · 0.88mm/px · z∈[-92,+51]mm · 8 of 100 slices shown (1 of 4)]
[im 1/100]
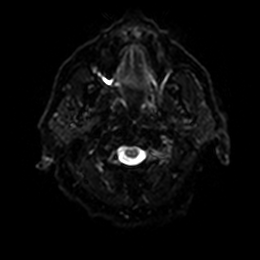
[im 15/100]
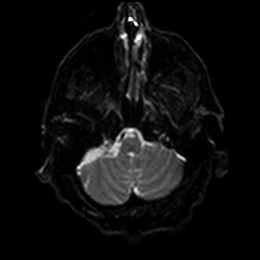
[im 29/100]
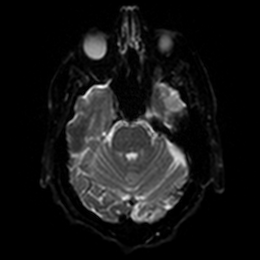
[im 43/100]
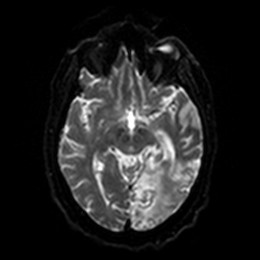
[im 57/100]
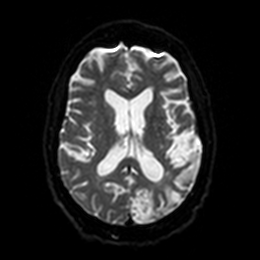
[im 71/100]
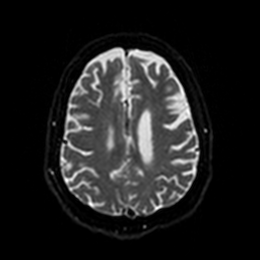
[im 85/100]
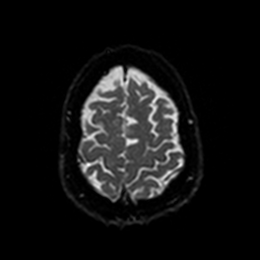
[im 100/100]
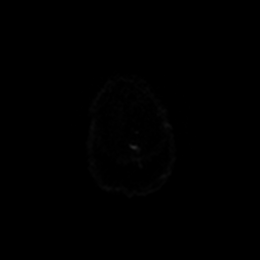

[Series 6: DWI · axial · 3.0mm · 0.88mm/px · z∈[-92,+51]mm · 4 of 50 slices shown (2 of 4)]
[im 1/50]
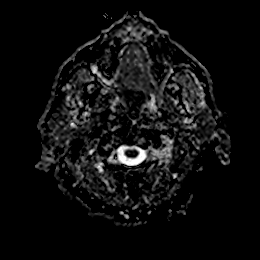
[im 17/50]
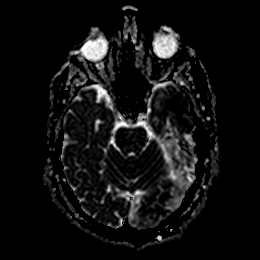
[im 33/50]
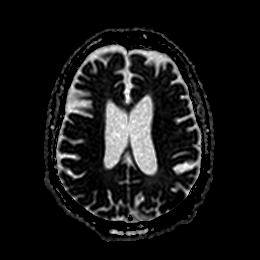
[im 50/50]
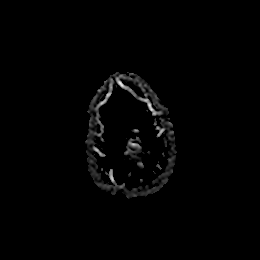

[Series 7: T1 · sagittal · 5.0mm · 0.75mm/px · 2 of 25 slices shown]
[im 1/25]
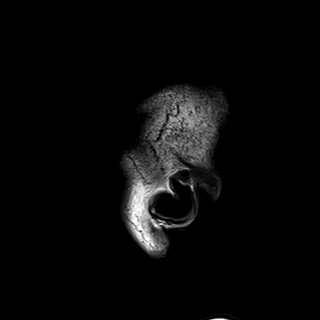
[im 25/25]
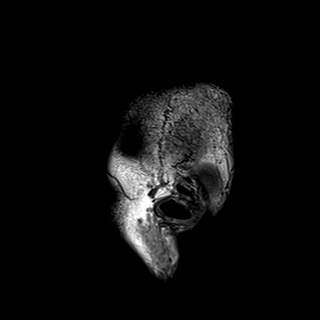

[Series 8: DWI · coronal · 4.0mm · 0.88mm/px · 5 of 70 slices shown (3 of 4)]
[im 1/70]
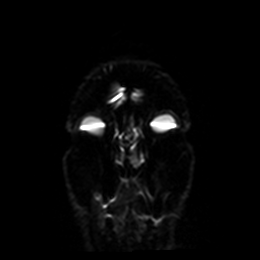
[im 18/70]
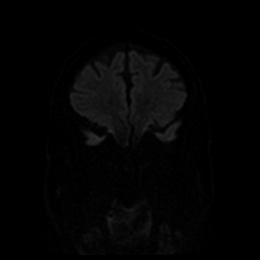
[im 35/70]
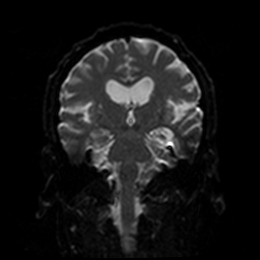
[im 52/70]
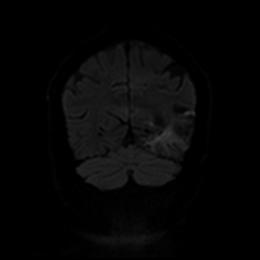
[im 70/70]
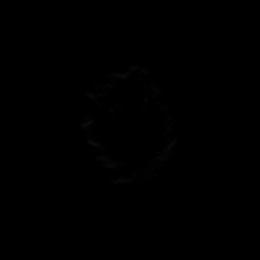

[Series 9: DWI · coronal · 4.0mm · 0.88mm/px · 3 of 35 slices shown (4 of 4)]
[im 1/35]
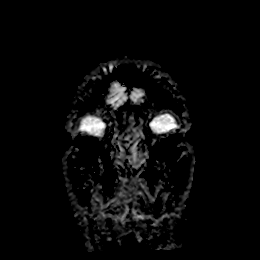
[im 18/35]
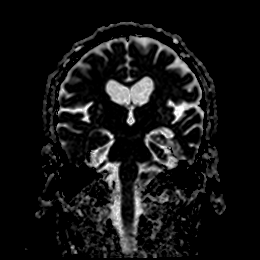
[im 35/35]
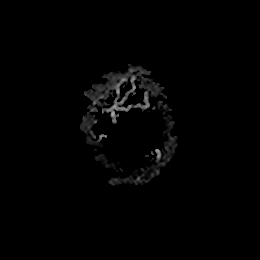

[Series 10: T2 · axial · 5.0mm · 0.72mm/px · z∈[-90,+51]mm · 2 of 25 slices shown (1 of 2)]
[im 1/25]
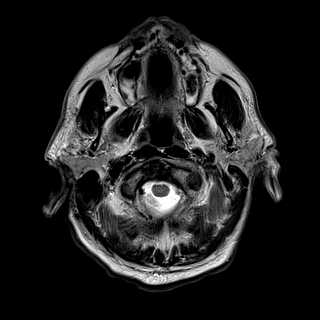
[im 25/25]
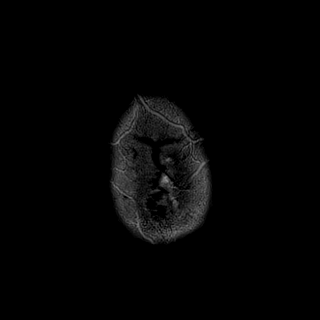

[Series 11: FLAIR · axial · 5.0mm · 0.45mm/px · z∈[-92,+49]mm · 2 of 25 slices shown]
[im 1/25]
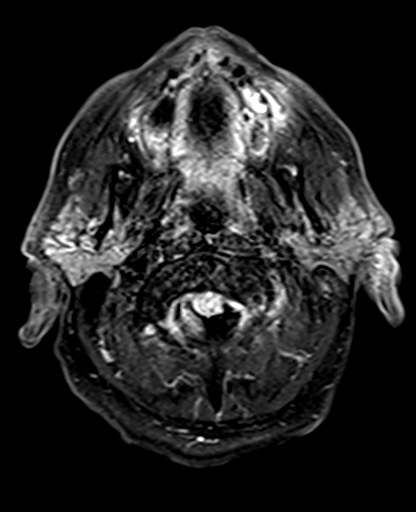
[im 25/25]
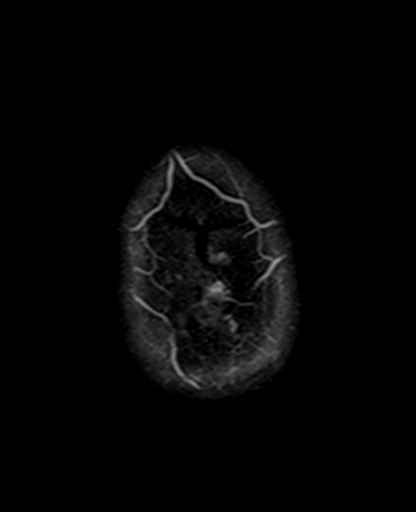

[Series 12: mag_images · axial · 3.0mm · 0.90mm/px · z∈[-102,+60]mm · 4 of 56 slices shown]
[im 1/56]
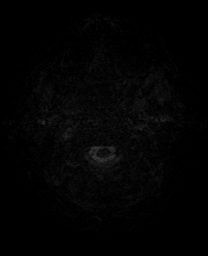
[im 19/56]
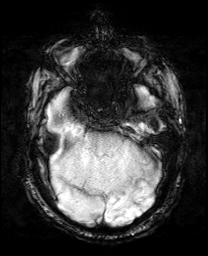
[im 37/56]
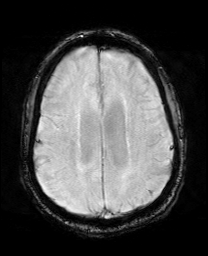
[im 56/56]
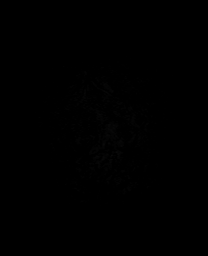

[Series 13: pha_images · axial · 3.0mm · 0.90mm/px · z∈[-102,+54]mm · 4 of 54 slices shown]
[im 1/54]
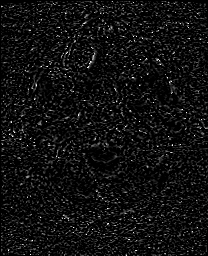
[im 18/54]
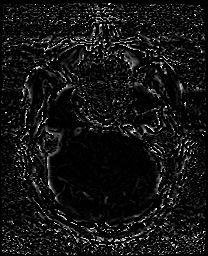
[im 36/54]
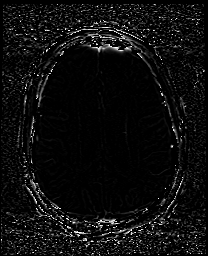
[im 54/54]
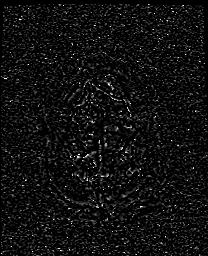

[Series 14: swi_images · axial · 3.0mm · 0.90mm/px · 1 of 56 slices shown]
[im 1/56]
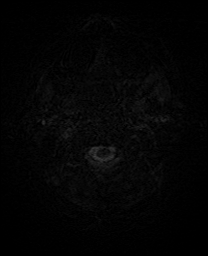

[Series 17: T2 · coronal · 5.0mm · 0.34mm/px · 2 of 29 slices shown (2 of 2)]
[im 1/29]
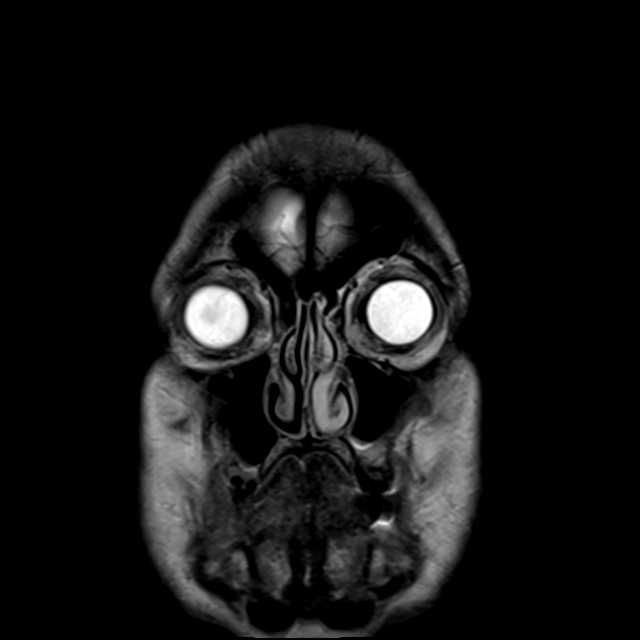
[im 29/29]
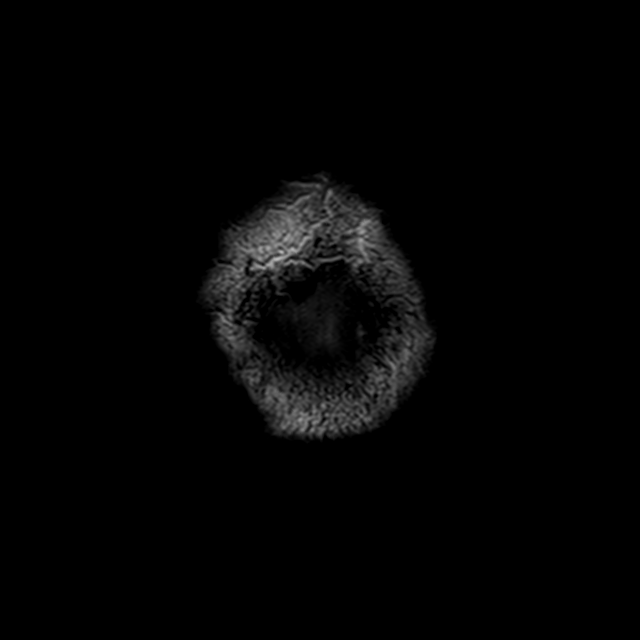

[37 of 48 positions shown; findings below may reference images not displayed]

FINDINGS: MRI HEAD FINDINGS

Brain: Generalized age-related cerebral atrophy. There has been
interval evolution of previously identified left PCA distribution
infarct, now late subacute to chronic in appearance. There are
superimposed scattered areas of more acute appearing diffusion
abnormality involving the left temporal occipital region,
corresponding with abnormality on most recent CT. Associated
hyperintense T2/FLAIR signal corresponds with the areas of diffusion
abnormality, suggesting acute to early subacute ischemic change.
Minimal patchy involvement of the left basal ganglia noted. Few
scattered serpiginous foci of susceptibility artifact and T1
hyperintensity seen, consistent with mild petechial blood products
and/or laminar necrosis. The preponderance of this finding is
associated with the more chronic left PCA distribution infarct. No
frank hemorrhagic transformation or significant regional mass
effect.

No other areas of acute or subacute infarction elsewhere within the
brain. Gray-white matter differentiation otherwise maintained. No
mass lesion or significant mass effect. No midline shift or
hydrocephalus. No extra-axial fluid collection. Pituitary gland
suprasellar region normal. Midline structures intact.

Vascular: Irregular abnormal flow void within the left V4 segment,
consistent with slow flow and/or occlusion (series 10, image 2).
Left PCA is likely occluded. Major intracranial vascular flow voids
are otherwise maintained.

Skull and upper cervical spine: Craniocervical junction within
normal limits. Bone marrow signal intensity normal. No scalp soft
tissue abnormality.

Sinuses/Orbits: Globes and orbital soft tissues demonstrate no acute
finding. Paranasal sinuses are largely clear. No significant mastoid
effusion. Inner ear structures grossly normal.

Other: None.

MRA HEAD FINDINGS

ANTERIOR CIRCULATION:

Visualized distal cervical segments of both internal carotid
arteries are patent with antegrade flow. Petrous, cavernous, and
supraclinoid segments patent without flow-limiting stenosis. 2 mm
focal outpouching extending laterally and slightly posteriorly from
the cavernous left ICA likely reflects a small vascular
infundibulum, seen on prior arteriogram (series 1, image 95). A1
segments patent bilaterally. Normal anterior communicating artery
complex. Both ACAs patent to their distal aspects without stenosis.

Short-segment severe mid left M1 stenosis (series [LH], image 11).
Mild to moderate stenosis at the distal right M1 segment (series
[LH], image 10). Normal MCA bifurcations. MCA branches well perfused
although demonstrate diffuse small vessel atheromatous irregularity.

POSTERIOR CIRCULATION:

Right V4 segment mildly irregular but patent without significant
stenosis by MRA. Severe stenosis involving the intracranial left V4
segment with markedly attenuated flow. There remains some persistent
antegrade flow to the vertebrobasilar junction. Both PICA origins
remain patent and well perfused. Moderate segmental stenoses
involving the proximal and mid basilar artery, stable. Basilar
widely patent distally. Superior cerebellar arteries patent
bilaterally. Right PCA supplied via the basilar as well as a small
right posterior communicating artery. Severe stenosis at the distal
right P2/P3 junction (series [LH], image 16). Right PCA patent
distally. There is occlusion of the left PCA at the proximal left P2
segment. A small left PCOM remains patent.

MRA NECK FINDINGS

AORTIC ARCH: Visualized aortic arch normal in caliber. Bovine
branching pattern noted. No stenosis about the origin of the great
vessels.

RIGHT CAROTID SYSTEM: Right CCA patent to the bifurcation. No
significant atheromatous irregularity or stenosis about the right
carotid bulb. Right ICA patent distally without stenosis, evidence
for dissection or occlusion.

LEFT CAROTID SYSTEM: Left common carotid artery patent from its
origin to the region of the left carotid bulb. Abrupt signal loss
spanning the left carotid bulb through the proximal left ICA related
to a vascular stent. Area of signal loss measures 3.5 cm in
craniocaudad dimension. Evaluation for potential intra stent
stenosis limited by MRA, although robust flow seen within the ICA
distal to the stent. No other stenosis, evidence for dissection or
occlusion.

VERTEBRAL ARTERIES: Both vertebral arteries arise from the
subclavian arteries. Focal tortuosity with short-segment of
approximately 50% stenosis at the proximal right subclavian artery,
proximal to the takeoff of the right vertebral artery (series [LH],
image 11), similar to prior CTA. Relatively mild 30% atheromatous
stenosis at the origins of the vertebral arteries not significantly
changed. Vertebral arteries otherwise patent within the neck without
stenosis, evidence for dissection or occlusion.
IMPRESSION: MRI HEAD IMPRESSION:

1. Acute to early subacute ischemic infarct involving the left
temporoccipital region, corresponding with abnormality seen on most
recent CT. Mild patchy involvement at the left basal ganglia. No
frank hemorrhagic transformation or significant regional mass
effect.
2. Interval evolution of superimposed/underlying left PCA
distribution infarct, now late subacute to chronic in appearance.
Associated susceptibility artifact consistent with petechial blood
products and/or laminar necrosis.

MRA HEAD IMPRESSION:

1. Chronic occlusion of the left PCA at the proximal left P2
segment.
2. Moderate segmental stenoses involving the proximal and mid
basilar artery, with severe stenosis at the distal right P2/P3
junction.
3. Short-segment severe mid left M1 stenosis, with short-segment
mild to moderate distal right M1 stenosis.
4. Distal small vessel atheromatous irregularity throughout the
intracranial circulation.

MRA NECK IMPRESSION:

1. Abrupt signal loss spanning the left carotid bulb through the
proximal left ICA related to a vascular stent. Evaluation for
potential intra stent stenosis limited by MRA, although robust flow
seen within the ICA distal to the stent.
2. Wide patency of the right carotid artery system without stenosis.
3. Relatively mild 30% atheromatous stenosis at the origins of the
vertebral arteries.
4. Short-segment 50% stenosis involving the proximal right
subclavian artery, proximal to the takeoff of the right vertebral
artery.

## 2020-06-01 MED ORDER — GADOBUTROL 1 MMOL/ML IV SOLN
8.5000 mL | Freq: Once | INTRAVENOUS | Status: AC | PRN
  Administered 2020-06-01: 8.5 mL via INTRAVENOUS

## 2020-06-01 MED ORDER — ATORVASTATIN CALCIUM 80 MG PO TABS
80.0000 mg | ORAL_TABLET | Freq: Every day | ORAL | Status: DC
  Administered 2020-06-01 – 2020-06-07 (×7): 80 mg via ORAL
  Filled 2020-06-01 (×4): qty 1
  Filled 2020-06-01: qty 2
  Filled 2020-06-01 (×2): qty 1

## 2020-06-01 MED ORDER — POTASSIUM CHLORIDE 20 MEQ PO PACK
40.0000 meq | PACK | Freq: Once | ORAL | Status: AC
  Administered 2020-06-01: 40 meq via ORAL
  Filled 2020-06-01: qty 2

## 2020-06-01 MED ORDER — CLOPIDOGREL BISULFATE 75 MG PO TABS
37.5000 mg | ORAL_TABLET | Freq: Once | ORAL | Status: AC
  Administered 2020-06-01: 37.5 mg via ORAL
  Filled 2020-06-01: qty 1

## 2020-06-01 MED ORDER — CEFAZOLIN SODIUM-DEXTROSE 2-4 GM/100ML-% IV SOLN
2.0000 g | INTRAVENOUS | Status: AC
  Administered 2020-06-04: 2 g via INTRAVENOUS

## 2020-06-01 MED ORDER — CLOPIDOGREL BISULFATE 75 MG PO TABS
75.0000 mg | ORAL_TABLET | Freq: Every day | ORAL | Status: DC
  Administered 2020-06-02 – 2020-06-04 (×3): 75 mg via ORAL
  Filled 2020-06-01 (×3): qty 1

## 2020-06-01 NOTE — Consult Note (Signed)
Chief Complaint: Patient was seen in consultation today for  Chief Complaint  Patient presents with  . Altered Mental Status    Referring Physician(s): Dr. Erlinda Hong  Supervising Physician: Luanne Bras  Patient Status: [facility] - In-pt  History of Present Illness: [name] is a 70 y.o. male with a history of prior PCA stroke and placement of ICA stent for left proximal ICA stenosis on 05/20/20 by Dr. Estanislado Pandy. He developed ecchymosis at the right femoral puncture site but imaging revealed no evidence of a pseudoaneurysm. He was discharged home 05/21/20 with instructions to take 37.5 mg Plavix once daily, 81 mg aspirin once daily and to follow up with Dr. Estanislado Pandy as an outpatient in two weeks.   He presented to the hospital 05/31/20 after he was found by a friend to have altered mental status. CT head demonstrated a large left parietal occipital infarct. MR imaging obtained.  CT Head 05/31/20 IMPRESSION: 1. 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 2. Hyperdense LEFT posterior cerebral artery. 3. Very small associated acute hemorrhage in the inferomedial occipital lobe. 4. No significant mass effect or midline shift at this time.  MRI/MRA Head and Neck 05/31/20  MRI HEAD IMPRESSION: 1. 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 Mild patchy involvement at the left basal ganglia. No frank hemorrhagic transformation or significant regional mass effect. 2. Interval evolution of superimposed/underlying left PCA distribution infarct, now late subacute to chronic in appearance. Associated susceptibility artifact consistent with petechial blood products and/or laminar necrosis.  MRA HEAD  IMPRESSION: 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 2. 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 3. Short-segment severe mid left M1 stenosis, with short-segment mild to moderate distal right M1 stenosis. 4. Distal small vessel atheromatous irregularity throughout the intracranial circulation.  MRA NECK IMPRESSION: 1. 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 Evaluation for potential intra stent stenosis limited by MRA, although robust flow seen within the ICA distal to the stent. 2. Wide patency of the right carotid artery system without stenosis. 3. Relatively mild 30% atheromatous stenosis at the origins of the vertebral arteries. 4. Short-segment 50% stenosis involving the proximal right subclavian artery, proximal to the takeoff of the right vertebral Artery.  Neuro Interventional Radiology was consulted by the neurology team to evaluate this patient for an image-guided diagnostic cerebral arteriogram with possible intervention for Left MCA stenosis. Images have been reviewed and procedure approved by Dr. Estanislado Pandy.   Past Medical History:  Diagnosis Date  . Hypertension   . Myocardial infarction ([place])    1990's  . Stroke [facility]) 03/15/2020    Past Surgical History:  Procedure Laterality Date  . IR ANGIO INTRA EXTRACRAN SEL COM CAROTID INNOMINATE BILAT MOD SED  05/02/2020  . IR ANGIO VERTEBRAL SEL SUBCLAVIAN INNOMINATE UNI L MOD SED  05/02/2020  . IR ANGIO VERTEBRAL SEL SUBCLAVIAN INNOMINATE UNI R MOD SED  05/20/2020  . IR ANGIO VERTEBRAL SEL VERTEBRAL UNI R MOD SED  05/02/2020  . IR INTRAVSC STENT CERV CAROTID W/EMB-PROT MOD SED INCL ANGIO  05/20/2020  . IR RADIOLOGIST EVAL & MGMT  03/27/2020  .  IR US GUIDE VASC ACCESS RIGHT  05/02/2020  . IR US GUIDE VASC ACCESS RIGHT  05/20/2020  . RADIOLOGY  WITH ANESTHESIA Left 05/20/2020   Procedure: RADIOLOGY WITH ANESTHESIA  LEFT ICA STENTING;  Surgeon: Luanne Bras, MD;  Location: [place] [facility];  Service: Radiology;  Laterality: Left;    Allergies: Ciprodex [ciprofloxacin-dexamethasone] and Metformin and related  Medications: Prior to Admission medications   Medication Sig Start Date End Date Taking? Authorizing Provider  acetaminophen (TYLENOL) 500 MG tablet Take 1,000 mg by mouth every 6 (six) hours as needed for moderate pain or headache.   Yes [provider]  aspirin 81 MG chewable tablet Chew 1 tablet (81 mg total) by mouth daily. 05/22/20  Yes Louk, Alexandra M, PA-C  atorvastatin (LIPITOR) 80 MG tablet Take 1 tablet (80 mg total) by mouth daily. 03/17/20  Yes Little Ishikawa, MD  b complex vitamins capsule Take 1 capsule by mouth daily.   Yes [provider]  clopidogrel (PLAVIX) 75 MG tablet Take 0.5 tablets (37.5 mg total) by mouth daily. 05/21/20 08/19/20 Yes Louk, Alexandra M, PA-C  hydrochlorothiazide (HYDRODIURIL) 25 MG tablet Take 1 tablet (25 mg total) by mouth daily. 03/18/20  Yes Pahwani, Einar Grad, MD  lisinopril (ZESTRIL) 40 MG tablet Take 40 mg by mouth daily.   Yes [provider]  potassium chloride SA (KLOR-CON) 20 MEQ tablet Take 1 tablet (20 mEq total) by mouth daily. 03/22/20  Yes Little Ishikawa, MD  traZODone (DESYREL) 50 MG tablet Take 50 mg by mouth at bedtime as needed for sleep. 04/28/20  Yes [provider]  VITAMIN D-VITAMIN K PO Take 1 capsule by mouth daily.   Yes [provider]     Family History  Problem Relation Age of Onset  . Heart attack Father   . Stroke Paternal Great-grandfather     Social History   Socioeconomic History  . Marital status: Single    Spouse name: Not on file  . Number of children: Not on file  . Years of education: Not on file  . Highest education level: Not on file  Occupational History  . Not on file  Tobacco Use  . Smoking  status: Former Smoker    Types: Cigarettes    Quit date: 2003    Years since quitting: 19.3  . Smokeless tobacco: Never Used  Vaping Use  . Vaping Use: Never used  Substance and Sexual Activity  . Alcohol use: Yes    Comment: occasionally. Not since stroke in February  . Drug use: No  . Sexual activity: Not on file  Other Topics Concern  . Not on file  Social History Narrative   Lives with daughter (temporary after stroke)   Right handed   Drinks 1-2 cups caffeine daily   Social Determinants of Health   Financial Resource Strain: Not on file  Food Insecurity: Not on file  Transportation Needs: Not on file  Physical Activity: Not on file  Stress: Not on file  Social Connections: Not on file    Review of Systems: A 12 point ROS discussed and pertinent positives are indicated in the HPI above.  All other systems are negative.  Review of Systems  Constitutional: Negative for appetite change and fatigue.  Eyes: Positive for visual disturbance.       From prior stroke  Respiratory: Negative for cough and shortness of breath.   Cardiovascular: Negative for chest pain and leg swelling.  Gastrointestinal: Negative for abdominal pain, diarrhea and  vomiting.  Neurological: Negative for dizziness and headaches.  Psychiatric/Behavioral: Positive for confusion and decreased concentration.       Intermittent    Vital Signs: BP (!) 100/59 (BP Location: Right Arm)   Pulse 60   Temp 98.2 F (36.8 C) (Oral)   Resp 16   SpO2 100%   Physical Exam Constitutional:      General: He is not in acute distress.    Appearance: He is not ill-appearing.  HENT:     Mouth/Throat:     Mouth: Mucous membranes are moist.     Pharynx: Oropharynx is clear.  Eyes:     General: Visual field deficit present.     Comments: Right eye deficit  Cardiovascular:     Rate and Rhythm: Normal rate.  Pulmonary:     Effort: Pulmonary effort is normal.     Breath sounds: Normal breath sounds.   Abdominal:     General: Bowel sounds are normal.     Palpations: Abdomen is soft.  Skin:    General: Skin is warm and dry.  Neurological:     Mental Status: He is alert and oriented to person, place, and time.     Comments: Able to answer questions appropriately but did make a few comments that were nonsensical. When I told him he would be NPO the day of the procedure he stated "is that where the put me in the thing to warm me up for 24 hours before my procedure".      Imaging: CT Head Wo Contrast  Result Date: 05/31/2020 CLINICAL DATA:  70 year old sent from [facility] for evaluation of confusion of unknown duration. EXAM: CT HEAD WITHOUT CONTRAST TECHNIQUE: Contiguous axial images were obtained from the base of the skull through the vertex without intravenous contrast. COMPARISON:  CT head 03/15/2020.  MRI brain 03/16/2020. FINDINGS: Brain: Geographic low attenuation involving the LEFT temporal lobe, LEFT posterior parietal lobe and the entire LEFT occipital lobe (within the distribution of the LEFT posterior cerebral artery and a portion of the LEFT middle cerebral artery) with sparing of a few peripheral gyri. The patient had a small stroke in this distribution on the prior MRI in February, 2022, but today the distribution involves nearly the entire occipital lobe and part of the posterior parietal lobe. There is a small focus of acute hemorrhage inferiorly and medially in the occipital lobe (series 2, image 14 and series 4, image 42). At this point, there is no significant mass effect or midline shift. Ventricular system normal in size and appearance for age. Stable mild age related cortical atrophy. Vascular: Moderate BILATERAL carotid siphon and LEFT vertebral artery atherosclerosis. Hyperdense LEFT posterior cerebral artery (2/13). Skull: No skull fracture or other focal osseous abnormality involving the skull. Sinuses/Orbits: Visualized paranasal sinuses, bilateral  mastoid air cells and bilateral middle ear cavities well-aerated. Visualized orbits and globes normal in appearance. Other: None. IMPRESSION: 1. 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 2. Hyperdense LEFT posterior cerebral artery. 3. Very small associated acute hemorrhage in the inferomedial occipital lobe. 4. No significant mass effect or midline shift at this time. I telephoned these critical results at the time of interpretation on 05/31/2020 at 3:08 pm to provider [facility], who verbally acknowledged these results. Electronically Signed   By: Sherran Needs.D.  On: 05/31/2020 15:13   MR ANGIO HEAD WO CONTRAST  Result Date: 06/01/2020 CLINICAL DATA:  Follow-up examination for acute stroke. EXAM: MRI HEAD WITHOUT CONTRAST MRA HEAD WITHOUT CONTRAST MRA NECK WITHOUT AND WITH CONTRAST TECHNIQUE: Multiplanar, multi-echo pulse sequences of the brain and surrounding structures were acquired without intravenous contrast. Angiographic images of the Circle of Willis were acquired using MRA technique without intravenous contrast. Angiographic images of the neck were acquired using MRA technique without and with intravenous contrast. Carotid stenosis measurements (when applicable) are obtained utilizing NASCET criteria, using the distal internal carotid diameter as the denominator. CONTRAST:  8.43m GADAVIST GADOBUTROL 1 MMOL/ML IV SOLN COMPARISON:  Prior studies from 05/31/2020 as well as earlier. FINDINGS: MRI HEAD FINDINGS Brain: Generalized age-related cerebral atrophy. There has been interval evolution of previously identified left PCA distribution infarct, now late subacute to chronic in appearance. There are superimposed scattered areas of more acute appearing diffusion abnormality involving the left temporal occipital  region, corresponding with abnormality on most recent CT. Associated hyperintense T2/FLAIR signal corresponds with the areas of diffusion abnormality, suggesting acute to early subacute ischemic change. Minimal patchy involvement of the left basal ganglia noted. Few scattered serpiginous foci of susceptibility artifact and T1 hyperintensity seen, consistent with mild petechial blood products and/or laminar necrosis. The preponderance of this finding is associated with the more chronic left PCA distribution infarct. No frank hemorrhagic transformation or significant regional mass effect. No other areas of acute or subacute infarction elsewhere within the brain. Gray-white matter differentiation otherwise maintained. No mass lesion or significant mass effect. No midline shift or hydrocephalus. No extra-axial fluid collection. Pituitary gland suprasellar region normal. Midline structures intact. Vascular: Irregular abnormal flow void within the left V4 segment, consistent with slow flow and/or occlusion (series 10, image 2). Left PCA is likely occluded. Major intracranial vascular flow voids are otherwise maintained. Skull and upper cervical spine: Craniocervical junction within normal limits. Bone marrow signal intensity normal. No scalp soft tissue abnormality. Sinuses/Orbits: Globes and orbital soft tissues demonstrate no acute finding. Paranasal sinuses are largely clear. No significant mastoid effusion. Inner ear structures grossly normal. Other: None. MRA HEAD FINDINGS ANTERIOR CIRCULATION: Visualized distal cervical segments of both internal carotid arteries are patent with antegrade flow. Petrous, cavernous, and supraclinoid segments patent without flow-limiting stenosis. 2 mm focal outpouching extending laterally and slightly posteriorly from the cavernous left ICA likely reflects a small vascular infundibulum, seen on prior arteriogram (series 1, image 95). A1 segments patent bilaterally. Normal anterior  communicating artery complex. Both ACAs patent to their distal aspects without stenosis. Short-segment severe mid left M1 stenosis (series 1031, image 11). Mild to moderate stenosis at the distal right M1 segment (series 1037, image 10). Normal MCA bifurcations. MCA branches well perfused although demonstrate diffuse small vessel atheromatous irregularity. POSTERIOR CIRCULATION: Right V4 segment mildly irregular but patent without significant stenosis by MRA. Severe stenosis involving the intracranial left V4 segment with markedly attenuated flow. There remains some persistent antegrade flow to the vertebrobasilar junction. Both PICA origins remain patent and well perfused. 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 Basilar widely patent distally. Superior cerebellar arteries patent bilaterally. Right PCA supplied via the basilar as well as a small right posterior communicating artery. Severe stenosis at the distal right P2/P3 junction (series 1043, image 16). Right PCA patent distally. There is occlusion of the left PCA at the proximal left P2 segment. A small left PCOM remains patent. MRA NECK FINDINGS AORTIC ARCH: Visualized aortic arch  normal in caliber. Bovine branching pattern noted. No stenosis about the origin of the great vessels. RIGHT CAROTID SYSTEM: Right CCA patent to the bifurcation. No significant atheromatous irregularity or stenosis about the right carotid bulb. Right ICA patent distally without stenosis, evidence for dissection or occlusion. LEFT CAROTID SYSTEM: Left common carotid artery patent from its origin to the region of the left carotid bulb. 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 Area of signal loss measures 3.5 cm in craniocaudad dimension. Evaluation for potential intra stent stenosis limited by MRA, although robust flow seen within the ICA distal to the stent. No other stenosis,  evidence for dissection or occlusion. VERTEBRAL ARTERIES: Both vertebral arteries arise from the subclavian arteries. Focal tortuosity with short-segment of approximately 50% stenosis at the proximal right subclavian artery, proximal to the takeoff of the right vertebral artery (series 1009, image 11), similar to prior CTA. Relatively mild 30% atheromatous stenosis at the origins of the vertebral arteries not significantly changed. Vertebral arteries otherwise patent within the neck without stenosis, evidence for dissection or occlusion. IMPRESSION: MRI HEAD IMPRESSION: 1. 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 Mild patchy involvement at the left basal ganglia. No frank hemorrhagic transformation or significant regional mass effect. 2. Interval evolution of superimposed/underlying left PCA distribution infarct, now late subacute to chronic in appearance. Associated susceptibility artifact consistent with petechial blood products and/or laminar necrosis. MRA HEAD IMPRESSION: 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 2. 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 3. Short-segment severe mid left M1 stenosis, with short-segment mild to moderate distal right M1 stenosis. 4. Distal small vessel atheromatous irregularity throughout the intracranial circulation. MRA NECK IMPRESSION: 1. 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 Evaluation for potential intra stent stenosis limited by MRA, although robust flow seen within the ICA distal to the stent. 2. Wide patency of the right carotid artery system without stenosis. 3. Relatively mild 30% atheromatous stenosis at the origins of the vertebral arteries. 4. Short-segment 50% stenosis involving the  proximal right subclavian artery, proximal to the takeoff of the right vertebral artery. Electronically Signed   By: Jeannine Boga M.D.   On: 06/01/2020 04:49   MR ANGIO NECK W WO CONTRAST  Result Date: 06/01/2020 CLINICAL DATA:  Follow-up examination for acute stroke. EXAM: MRI HEAD WITHOUT CONTRAST MRA HEAD WITHOUT CONTRAST MRA NECK WITHOUT AND WITH CONTRAST TECHNIQUE: Multiplanar, multi-echo pulse sequences of the brain and surrounding structures were acquired without intravenous contrast. Angiographic images of the Circle of Willis were acquired using MRA technique without intravenous contrast. Angiographic images of the neck were acquired using MRA technique without and with intravenous contrast. Carotid stenosis measurements (when applicable) are obtained utilizing NASCET criteria, using the distal internal carotid diameter as the denominator. CONTRAST:  8.32m GADAVIST GADOBUTROL 1 MMOL/ML IV SOLN COMPARISON:  Prior studies from 05/31/2020 as well as earlier. FINDINGS: MRI HEAD FINDINGS Brain: Generalized age-related cerebral atrophy. There has been interval evolution of previously identified left PCA distribution infarct, now late subacute to chronic in appearance. There are superimposed scattered areas of more acute appearing diffusion abnormality involving the left temporal occipital region, corresponding with abnormality on most recent CT. Associated hyperintense T2/FLAIR signal corresponds with  the areas of diffusion abnormality, suggesting acute to early subacute ischemic change. Minimal patchy involvement of the left basal ganglia noted. Few scattered serpiginous foci of susceptibility artifact and T1 hyperintensity seen, consistent with mild petechial blood products and/or laminar necrosis. The preponderance of this finding is associated with the more chronic left PCA distribution infarct. No frank hemorrhagic transformation or significant regional mass effect. No other areas of acute or  subacute infarction elsewhere within the brain. Gray-white matter differentiation otherwise maintained. No mass lesion or significant mass effect. No midline shift or hydrocephalus. No extra-axial fluid collection. Pituitary gland suprasellar region normal. Midline structures intact. Vascular: Irregular abnormal flow void within the left V4 segment, consistent with slow flow and/or occlusion (series 10, image 2). Left PCA is likely occluded. Major intracranial vascular flow voids are otherwise maintained. Skull and upper cervical spine: Craniocervical junction within normal limits. Bone marrow signal intensity normal. No scalp soft tissue abnormality. Sinuses/Orbits: Globes and orbital soft tissues demonstrate no acute finding. Paranasal sinuses are largely clear. No significant mastoid effusion. Inner ear structures grossly normal. Other: None. MRA HEAD FINDINGS ANTERIOR CIRCULATION: Visualized distal cervical segments of both internal carotid arteries are patent with antegrade flow. Petrous, cavernous, and supraclinoid segments patent without flow-limiting stenosis. 2 mm focal outpouching extending laterally and slightly posteriorly from the cavernous left ICA likely reflects a small vascular infundibulum, seen on prior arteriogram (series 1, image 95). A1 segments patent bilaterally. Normal anterior communicating artery complex. Both ACAs patent to their distal aspects without stenosis. Short-segment severe mid left M1 stenosis (series 1031, image 11). Mild to moderate stenosis at the distal right M1 segment (series 1037, image 10). Normal MCA bifurcations. MCA branches well perfused although demonstrate diffuse small vessel atheromatous irregularity. POSTERIOR CIRCULATION: Right V4 segment mildly irregular but patent without significant stenosis by MRA. Severe stenosis involving the intracranial left V4 segment with markedly attenuated flow. There remains some persistent antegrade flow to the vertebrobasilar  junction. Both PICA origins remain patent and well perfused. 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 Basilar widely patent distally. Superior cerebellar arteries patent bilaterally. Right PCA supplied via the basilar as well as a small right posterior communicating artery. Severe stenosis at the distal right P2/P3 junction (series 1043, image 16). Right PCA patent distally. There is occlusion of the left PCA at the proximal left P2 segment. A small left PCOM remains patent. MRA NECK FINDINGS AORTIC ARCH: Visualized aortic arch normal in caliber. Bovine branching pattern noted. No stenosis about the origin of the great vessels. RIGHT CAROTID SYSTEM: Right CCA patent to the bifurcation. No significant atheromatous irregularity or stenosis about the right carotid bulb. Right ICA patent distally without stenosis, evidence for dissection or occlusion. LEFT CAROTID SYSTEM: Left common carotid artery patent from its origin to the region of the left carotid bulb. 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 Area of signal loss measures 3.5 cm in craniocaudad dimension. Evaluation for potential intra stent stenosis limited by MRA, although robust flow seen within the ICA distal to the stent. No other stenosis, evidence for dissection or occlusion. VERTEBRAL ARTERIES: Both vertebral arteries arise from the subclavian arteries. Focal tortuosity with short-segment of approximately 50% stenosis at the proximal right subclavian artery, proximal to the takeoff of the right vertebral artery (series 1009, image 11), similar to prior CTA. Relatively mild 30% atheromatous stenosis at the origins of the vertebral arteries not significantly changed. Vertebral  arteries otherwise patent within the neck without stenosis, evidence for dissection or occlusion. IMPRESSION: MRI HEAD IMPRESSION: 1. Acute to early subacute ischemic infarct  involving the left temporoccipital region, corresponding with abnormality seen on most recent CT. Mild patchy involvement at the left basal ganglia. No frank hemorrhagic transformation or significant regional mass effect. 2. Interval evolution of superimposed/underlying left PCA distribution infarct, now late subacute to chronic in appearance. Associated susceptibility artifact consistent with petechial blood products and/or laminar necrosis. MRA HEAD IMPRESSION: 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 2. 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 3. Short-segment severe mid left M1 stenosis, with short-segment mild to moderate distal right M1 stenosis. 4. Distal small vessel atheromatous irregularity throughout the intracranial circulation. MRA NECK IMPRESSION: 1. 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 Evaluation for potential intra stent stenosis limited by MRA, although robust flow seen within the ICA distal to the stent. 2. Wide patency of the right carotid artery system without stenosis. 3. Relatively mild 30% atheromatous stenosis at the origins of the vertebral arteries. 4. Short-segment 50% stenosis involving the proximal right subclavian artery, proximal to the takeoff of the right vertebral artery. Electronically Signed   By: Jeannine Boga M.D.   On: 06/01/2020 04:49   MR BRAIN WO CONTRAST  Result Date: 06/01/2020 CLINICAL DATA:  Follow-up examination for acute stroke. EXAM: MRI HEAD WITHOUT CONTRAST MRA HEAD WITHOUT CONTRAST MRA NECK WITHOUT AND WITH CONTRAST TECHNIQUE: Multiplanar, multi-echo pulse sequences of the brain and surrounding structures were acquired without intravenous contrast. Angiographic images of the Circle of Willis were acquired using MRA technique without intravenous contrast.  Angiographic images of the neck were acquired using MRA technique without and with intravenous contrast. Carotid stenosis measurements (when applicable) are obtained utilizing NASCET criteria, using the distal internal carotid diameter as the denominator. CONTRAST:  8.33m GADAVIST GADOBUTROL 1 MMOL/ML IV SOLN COMPARISON:  Prior studies from 05/31/2020 as well as earlier. FINDINGS: MRI HEAD FINDINGS Brain: Generalized age-related cerebral atrophy. There has been interval evolution of previously identified left PCA distribution infarct, now late subacute to chronic in appearance. There are superimposed scattered areas of more acute appearing diffusion abnormality involving the left temporal occipital region, corresponding with abnormality on most recent CT. Associated hyperintense T2/FLAIR signal corresponds with the areas of diffusion abnormality, suggesting acute to early subacute ischemic change. Minimal patchy involvement of the left basal ganglia noted. Few scattered serpiginous foci of susceptibility artifact and T1 hyperintensity seen, consistent with mild petechial blood products and/or laminar necrosis. The preponderance of this finding is associated with the more chronic left PCA distribution infarct. No frank hemorrhagic transformation or significant regional mass effect. No other areas of acute or subacute infarction elsewhere within the brain. Gray-white matter differentiation otherwise maintained. No mass lesion or significant mass effect. No midline shift or hydrocephalus. No extra-axial fluid collection. Pituitary gland suprasellar region normal. Midline structures intact. Vascular: Irregular abnormal flow void within the left V4 segment, consistent with slow flow and/or occlusion (series 10, image 2). Left PCA is likely occluded. Major intracranial vascular flow voids are otherwise maintained. Skull and upper cervical spine: Craniocervical junction within normal limits. Bone marrow signal intensity  normal. No scalp soft tissue abnormality. Sinuses/Orbits: Globes and orbital soft tissues demonstrate no acute finding. Paranasal sinuses are largely clear. No significant mastoid effusion. Inner ear  structures grossly normal. Other: None. MRA HEAD FINDINGS ANTERIOR CIRCULATION: Visualized distal cervical segments of both internal carotid arteries are patent with antegrade flow. Petrous, cavernous, and supraclinoid segments patent without flow-limiting stenosis. 2 mm focal outpouching extending laterally and slightly posteriorly from the cavernous left ICA likely reflects a small vascular infundibulum, seen on prior arteriogram (series 1, image 95). A1 segments patent bilaterally. Normal anterior communicating artery complex. Both ACAs patent to their distal aspects without stenosis. Short-segment severe mid left M1 stenosis (series 1031, image 11). Mild to moderate stenosis at the distal right M1 segment (series 1037, image 10). Normal MCA bifurcations. MCA branches well perfused although demonstrate diffuse small vessel atheromatous irregularity. POSTERIOR CIRCULATION: Right V4 segment mildly irregular but patent without significant stenosis by MRA. Severe stenosis involving the intracranial left V4 segment with markedly attenuated flow. There remains some persistent antegrade flow to the vertebrobasilar junction. Both PICA origins remain patent and well perfused. 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 Basilar widely patent distally. Superior cerebellar arteries patent bilaterally. Right PCA supplied via the basilar as well as a small right posterior communicating artery. Severe stenosis at the distal right P2/P3 junction (series 1043, image 16). Right PCA patent distally. There is occlusion of the left PCA at the proximal left P2 segment. A small left PCOM remains patent. MRA NECK FINDINGS AORTIC ARCH: Visualized aortic arch normal in caliber. Bovine branching pattern  noted. No stenosis about the origin of the great vessels. RIGHT CAROTID SYSTEM: Right CCA patent to the bifurcation. No significant atheromatous irregularity or stenosis about the right carotid bulb. Right ICA patent distally without stenosis, evidence for dissection or occlusion. LEFT CAROTID SYSTEM: Left common carotid artery patent from its origin to the region of the left carotid bulb. 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 Area of signal loss measures 3.5 cm in craniocaudad dimension. Evaluation for potential intra stent stenosis limited by MRA, although robust flow seen within the ICA distal to the stent. No other stenosis, evidence for dissection or occlusion. VERTEBRAL ARTERIES: Both vertebral arteries arise from the subclavian arteries. Focal tortuosity with short-segment of approximately 50% stenosis at the proximal right subclavian artery, proximal to the takeoff of the right vertebral artery (series 1009, image 11), similar to prior CTA. Relatively mild 30% atheromatous stenosis at the origins of the vertebral arteries not significantly changed. Vertebral arteries otherwise patent within the neck without stenosis, evidence for dissection or occlusion. IMPRESSION: MRI HEAD IMPRESSION: 1. 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 Mild patchy involvement at the left basal ganglia. No frank hemorrhagic transformation or significant regional mass effect. 2. Interval evolution of superimposed/underlying left PCA distribution infarct, now late subacute to chronic in appearance. Associated susceptibility artifact consistent with petechial blood products and/or laminar necrosis. MRA HEAD IMPRESSION: 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 2. 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 3. Short-segment severe mid left M1 stenosis, with short-segment mild to moderate distal right M1 stenosis. 4. Distal small vessel atheromatous irregularity throughout the intracranial circulation. MRA NECK IMPRESSION: 1. 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 Evaluation for potential intra stent stenosis limited by MRA, although robust flow seen within the  ICA distal to the stent. 2. Wide patency of the right carotid artery system without stenosis. 3. Relatively mild 30% atheromatous stenosis at the origins of the vertebral arteries. 4. Short-segment 50% stenosis involving the proximal right subclavian artery, proximal to the takeoff of the right vertebral artery. Electronically Signed   By: Jeannine Boga M.D.   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 EXAM: INTRAVSC STENT CERV CAROTID W/ EMB-PROT COMPARISON:  Diagnostic catheter arteriogram of May 02, 2020. MEDICATIONS: Heparin 3,000 units IA. Ancef 2 g IV antibiotic was administered within 1 hour of the procedure. ANESTHESIA/SEDATION: MAC anesthesia as per the department of Anesthesiology at [place]:  Isovue 300 approximately 90 mL. FLUOROSCOPY TIME:  Fluoroscopy Time: 41 minutes 0 seconds (1147 mGy). COMPLICATIONS: None immediate. TECHNIQUE: Informed written consent was obtained from the patient after a thorough discussion of the procedural risks, benefits and alternatives. All questions were addressed. Maximal Sterile Barrier Technique was utilized including caps, mask, sterile gowns, sterile gloves, sterile drape, hand hygiene and skin antiseptic. A timeout was performed prior to the initiation of the procedure. The right groin was prepped and draped in the  usual sterile fashion. Thereafter using modified Seldinger technique, transfemoral access into the right common femoral artery was obtained without difficulty. Over a 0.035 inch guidewire, an 8 French 5 cm Pinnacle sheath was inserted. Through this, and also over 0.035 inch guidewire, a 5 [place] JB 1 catheter was advanced to the aortic arch region and selectively positioned in left common carotid artery. FINDINGS: The left common carotid arteriogram demonstrates the left external carotid artery and its major branches to be widely patent. The left internal carotid artery just distal to the bulb has a severe high-grade stenosis secondary to a smooth arteriosclerotic plaque. No evidence of ulcerations or of intraluminal filling defects noted. The vessel is, otherwise, seen to opacify to the cranial skull base. The petrous, cavernous and supraclinoid segments are widely patent. The left middle cerebral artery opacifies into the capillary and venous phases. Again demonstrated is approximately 50-60% stenosis of the mid M1 segment. The left anterior cerebral artery opacifies into the capillary and venous phases. ENDOVASCULAR REVASCULARIZATION OF SEVERELY STENOTIC LEFT INTERNAL CAROTID ARTERY PROXIMALLY WITH DISTAL PROTECTION The diagnostic JB 1 catheter in the left common carotid artery was exchanged over a 0.035 inch 300 cm Rosen exchange guidewire for a 90 cm 6 [place] Cook shuttle sheath. The guidewire was removed. Good aspiration obtained from the hub of the [facility] shuttle sheath. A gentle control arteriogram performed through the sheath demonstrated no evidence of spasms, dissections or of intraluminal filling defects. Measurements were then performed of the left internal carotid artery its most normal segment just distal to the stenosis, and also at the cervical petrous junction. The left common carotid artery distally was also evaluated in the AP and lateral projections. A 4/7 French NAV Emboshield filter was then  prepped and purged with heparinized saline infusion in its housing. After clearing the air antegradely and retrogradely, the filter device was captured into the microcatheter delivery system. The combination of the 0.014 inch wire, and the delivery catheter was retrieved. A J configuration was given to the distal end of the 0.014 inch micro guidewire. Using biplane roadmap technique and constant fluoroscopic guidance in a coaxial manner and with constant heparinized saline infusion, the combination of the filter delivery system  was then advanced to just distal to the [place] shuttle sheath. Using a torque device, the wire was advanced with the delivery microcatheter across the high-grade stenosis without event. The combination was advanced to the distal cervical segment at the cranial skull base. The wire was in the horizontal petrous segment. The Emboshield was then delivered in the usual manner by retrieving the delivery microcatheter. This was then removed maintaining stability at the distal end of the micro guidewire. A control arteriogram performed through the [facility] shuttle sheath demonstrated no evidence of spasms, dissections or of the filling defects. A 6-8 mm x 40 mm Xact stent delivery system was then prepped and purged retrogradely with heparinized saline infusion. Using the rapid exchange technique, the stent delivery system was then advanced to the severe stenosis. The distal and proximal markers were then positioned adequate distance from the severe stenosis. Stent was then deployed in the usual manner without difficulty. The delivery apparatus was then retrieved and removed. A control arteriogram performed through the [facility] shuttle sheath demonstrated excellent apposition proximally and distally with modestly improved caliber of the previously noted high-grade stenosis. A 5 mm x 30 mm 0.014 Viatrac balloon was then prepped and purged anteriorly and retrogradely. Using the rapid exchange technique,  this was again advanced to the left internal carotid artery proximally within the stent. The proximal and distal markers were aligned. A control inflation was then performed with a micro inflation syringe device via micro tubing slowly to 9.1 atmospheres where it was maintained for approximately 1 minute. The balloon was deflated and retrieved and removed maintaining safe position of the tip of the distal Emboshield. A control arteriogram performed through the [facility] shuttle sheath demonstrated excellent apposition and nearly complete obliteration of the previously noted stenosis. More distally intracranially the distal internal carotid artery, and the left middle and left anterior cerebral artery demonstrated wide patency with again the left middle cerebral artery approximately 50-60% stenosis more regularly and clearly visible. An Emboshield capture microcatheter was then advanced again using the rapid exchange technique into the proximal marker on the Emboshield. This was then retrieved into the capture microcatheter by retrieving the micro guidewire. The combination was then retrieved under constant observation without any entanglement within the stent. Control arteriograms were then performed at 10, 20 and 30 minutes post stent angioplasty. These continued to demonstrate no evidence of intra stent filling defects or irregularities. However, patient was given a total of 3 mg of intra-arterial Integrilin in order to prevent platelet aggregation within the stent. Final control arteriogram performed through the [facility] shuttle sheath in the left common carotid artery continued to demonstrate excellent apposition of flow proximally within the stent, with no change intracranially in terms of intraluminal filling defects or of vessel occlusions. The [facility] shuttle sheath was then removed. 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 Following the manual compression, a small hematoma was noted which was flattened with manual compression. At the end of this, the groin puncture site appeared soft without any clinical evidence of pain. Distal pulses remained Dopplerable in both feet unchanged. The patient was then transferred to the PACU and then neuro ICU for overnight close neurologic observations, maintaining controlled blood pressure, and IV heparin. Overnight, the patient remained stable. The following morning, the IV heparin was stopped and the patient was switched to aspirin 81 mg a day, and 75 mg. An ultrasound of the right  groin obtained the day before right after the procedure, and the following morning continued to demonstrate stable hematoma in the right groin without evidence of pseudo aneurysm. Patient was then mobilized with help. He was able to take in a normal meal without event. 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 Neurologically, otherwise, continued to have difficulty with word finding though slightly improved according to the patient. The patient did have visual apraxia being unable to read correct time on the clock on the wall. Patient was then discharged home under the care of his daughter. Of importance was the patient maintaining adequate hydration, and to avoid heavy stooping, bending or lifting weights above 10 pounds for approximately 14 days. Patient was advised not to drive either. Also the patient was asked to avoid heavy lifting and climbing up and down staircases. He was asked to maintained close monitoring of the right groin. Should he develop further bruising, swelling, or pain with weight-bearing he was asked to report to the ER. Patient and the daughter expressed understanding and agreement with the above management plan. IMPRESSION: Status post endovascular revascularization of severely stenotic symptomatic left internal  carotid artery stenosis with distal protection with stent assisted angioplasty as described. PLAN: Follow-up in the clinic 2 weeks post discharge. Patient to take aspirin 81 mg a day, and Plavix 37.5 mg per day. Electronically Signed   By: Luanne Bras M.D.   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 EXAM: INTRAVSC STENT CERV CAROTID W/ EMB-PROT COMPARISON:  Diagnostic catheter arteriogram of May 02, 2020. MEDICATIONS: Heparin 3,000 units IA. Ancef 2 g IV antibiotic was administered within 1 hour of the procedure. ANESTHESIA/SEDATION: MAC anesthesia as per the department of Anesthesiology at [place]:  Isovue 300 approximately 90 mL. FLUOROSCOPY TIME:  Fluoroscopy Time: 41 minutes 0 seconds (1147 mGy). COMPLICATIONS: None immediate. TECHNIQUE: Informed written consent was obtained from the patient after a thorough discussion of the procedural risks, benefits and alternatives. All questions were addressed. Maximal Sterile Barrier Technique was utilized including caps, mask, sterile gowns, sterile gloves, sterile drape, hand hygiene and skin antiseptic. A timeout was performed prior to the initiation of the procedure. The right groin was prepped and draped in the usual sterile fashion. Thereafter using modified Seldinger technique, transfemoral access into the right common femoral artery was obtained without difficulty. Over a 0.035 inch guidewire, an 8 French 5 cm Pinnacle sheath was inserted. Through this, and also over 0.035 inch guidewire, a 5 [place] JB 1 catheter was advanced to the aortic arch region and selectively positioned in left common carotid artery. FINDINGS: The left common carotid arteriogram demonstrates the left external carotid artery and its major branches to be widely patent. The left internal carotid artery just distal to the bulb has a  severe high-grade stenosis secondary to a smooth arteriosclerotic plaque. No evidence of ulcerations or of intraluminal filling defects noted. The vessel is, otherwise, seen to opacify to the cranial skull base. The petrous, cavernous and supraclinoid segments are widely patent. The left middle cerebral artery opacifies into the capillary and venous phases. Again demonstrated is approximately 50-60% stenosis of the mid M1 segment. The left anterior cerebral artery opacifies into the capillary and venous phases. ENDOVASCULAR REVASCULARIZATION OF SEVERELY STENOTIC LEFT INTERNAL CAROTID ARTERY PROXIMALLY WITH DISTAL PROTECTION The diagnostic JB 1 catheter in  the left common carotid artery was exchanged over a 0.035 inch 300 cm Rosen exchange guidewire for a 90 cm 6 [place] Cook shuttle sheath. The guidewire was removed. Good aspiration obtained from the hub of the [facility] shuttle sheath. A gentle control arteriogram performed through the sheath demonstrated no evidence of spasms, dissections or of intraluminal filling defects. Measurements were then performed of the left internal carotid artery its most normal segment just distal to the stenosis, and also at the cervical petrous junction. The left common carotid artery distally was also evaluated in the AP and lateral projections. A 4/7 French NAV Emboshield filter was then prepped and purged with heparinized saline infusion in its housing. After clearing the air antegradely and retrogradely, the filter device was captured into the microcatheter delivery system. The combination of the 0.014 inch wire, and the delivery catheter was retrieved. A J configuration was given to the distal end of the 0.014 inch micro guidewire. Using biplane roadmap technique and constant fluoroscopic guidance in a coaxial manner and with constant heparinized saline infusion, the combination of the filter delivery system was then advanced to just distal to the [place] shuttle sheath. Using  a torque device, the wire was advanced with the delivery microcatheter across the high-grade stenosis without event. The combination was advanced to the distal cervical segment at the cranial skull base. The wire was in the horizontal petrous segment. The Emboshield was then delivered in the usual manner by retrieving the delivery microcatheter. This was then removed maintaining stability at the distal end of the micro guidewire. A control arteriogram performed through the [facility] shuttle sheath demonstrated no evidence of spasms, dissections or of the filling defects. A 6-8 mm x 40 mm Xact stent delivery system was then prepped and purged retrogradely with heparinized saline infusion. Using the rapid exchange technique, the stent delivery system was then advanced to the severe stenosis. The distal and proximal markers were then positioned adequate distance from the severe stenosis. Stent was then deployed in the usual manner without difficulty. The delivery apparatus was then retrieved and removed. A control arteriogram performed through the [facility] shuttle sheath demonstrated excellent apposition proximally and distally with modestly improved caliber of the previously noted high-grade stenosis. A 5 mm x 30 mm 0.014 Viatrac balloon was then prepped and purged anteriorly and retrogradely. Using the rapid exchange technique, this was again advanced to the left internal carotid artery proximally within the stent. The proximal and distal markers were aligned. A control inflation was then performed with a micro inflation syringe device via micro tubing slowly to 9.1 atmospheres where it was maintained for approximately 1 minute. The balloon was deflated and retrieved and removed maintaining safe position of the tip of the distal Emboshield. A control arteriogram performed through the [facility] shuttle sheath demonstrated excellent apposition and nearly complete obliteration of the previously noted stenosis. More distally  intracranially the distal internal carotid artery, and the left middle and left anterior cerebral artery demonstrated wide patency with again the left middle cerebral artery approximately 50-60% stenosis more regularly and clearly visible. An Emboshield capture microcatheter was then advanced again using the rapid exchange technique into the proximal marker on the Emboshield. This was then retrieved into the capture microcatheter by retrieving the micro guidewire. The combination was then retrieved under constant observation without any entanglement within the stent. Control arteriograms were then performed at 10, 20 and 30 minutes post stent angioplasty. These continued to demonstrate no evidence of intra stent filling defects  or irregularities. However, patient was given a total of 3 mg of intra-arterial Integrilin in order to prevent platelet aggregation within the stent. Final control arteriogram performed through the [facility] shuttle sheath in the left common carotid artery continued to demonstrate excellent apposition of flow proximally within the stent, with no change intracranially in terms of intraluminal filling defects or of vessel occlusions. [facility] [facility] shuttle sheath was then removed. 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 Following the manual compression, a small hematoma was noted which was flattened with manual compression. At the end of this, the groin puncture site appeared soft without any clinical evidence of pain. Distal pulses remained Dopplerable in both feet unchanged. The patient was then transferred to the PACU and then neuro ICU for overnight close neurologic observations, maintaining controlled blood pressure, and IV heparin. Overnight, the patient remained stable. The following morning, the IV heparin was stopped and the patient was switched to aspirin 81 mg a day, and 75 mg. An ultrasound  of the right groin obtained the day before right after the procedure, and the following morning continued to demonstrate stable hematoma in the right groin without evidence of pseudo aneurysm. Patient was then mobilized with help. He was able to take in a normal meal without event. 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 Neurologically, otherwise, continued to have difficulty with word finding though slightly improved according to the patient. The patient did have visual apraxia being unable to read correct time on the clock on the wall. Patient was then discharged home under the care of his daughter. Of importance was the patient maintaining adequate hydration, and to avoid heavy stooping, bending or lifting weights above 10 pounds for approximately 14 days. Patient was advised not to drive either. Also the patient was asked to avoid heavy lifting and climbing up and down staircases. He was asked to maintained close monitoring of the right groin. Should he develop further bruising, swelling, or pain with weight-bearing he was asked to report to the ER. Patient and the daughter expressed understanding and agreement with the above management plan. IMPRESSION: Status post endovascular revascularization of severely stenotic symptomatic left internal carotid artery stenosis with distal protection with stent assisted angioplasty as described. PLAN: Follow-up in the clinic 2 weeks post discharge. Patient to take aspirin 81 mg a day, and Plavix 37.5 mg per day. Electronically Signed   By: Luanne Bras M.D.   On: 05/22/2020 10:00   DG CHEST PORT 1 VIEW  Result Date: 05/31/2020 CLINICAL DATA:  Screening for metal prior to MRI. EXAM: PORTABLE CHEST 1 VIEW COMPARISON:  None. FINDINGS: Overlying monitoring devices in place. Lung volumes are low. No implanted medical device or radiopaque foreign body in the chest. Normal heart size. Aortic  atherosclerosis and tortuosity. No pleural effusion or pneumothorax. No focal airspace disease. No acute osseous abnormalities are seen. IMPRESSION: 1. No implanted medical device or radiopaque foreign body in the chest to preclude MRI imaging. 2. Low lung volumes without acute abnormality. Electronically Signed   By: Keith Rake M.D.   On: 05/31/2020 23:38   DG Abd Portable 1V  Result Date: 05/31/2020 CLINICAL DATA:  Screening for metal prior to MRI. EXAM: PORTABLE ABDOMEN - 1 VIEW COMPARISON:  None. FINDINGS: Overlying monitoring devices in place. No implanted medical device or radiopaque foreign body to preclude MRI imaging. Normal bowel gas  pattern. Advanced vascular calcifications. No radiopaque calculi. No acute osseous abnormalities are seen. IMPRESSION: No implanted medical device or radiopaque foreign body to preclude MRI imaging. Electronically Signed   By: Keith Rake M.D.   On: 05/31/2020 23:40   VAS [place] GROIN PSEUDOANEURYSM  Result Date: 05/21/2020  ARTERIAL PSEUDOANEURYSM  Patient Name:  [name]  Date of Exam:   05/21/2020 Medical Rec #: [id number]         Accession #:    [id number] Date of Birth: [birth year]        Patient Gender: M Patient Age:   69Y Exam Location:  [facility] Procedure:      VAS [place] GROIN PSEUDOANEURYSM Referring Phys: [id number] Ronney Lion --------------------------------------------------------------------------------  Exam: Right groin History: S/p catheterization S/P left ICA stent via right femoral. Performing Technologist: Oda Cogan RDMS, RVT  Examination Guidelines: A complete evaluation includes B-mode imaging, spectral Doppler, color Doppler, and power Doppler as needed of all accessible portions of each vessel. Bilateral testing is considered an integral part of a complete examination. Limited examinations for reoccurring indications may be performed as noted.  Findings: A mixed echogenic structure measuring approximately 1.0 cm x 1.3  cm is visualized at the Right groin with ultrasound characteristics of a hematoma. There is no significant change in this exam when compared to prior exam.  Summary: No evidence of a definitive pseudoaneurysm or AVF. A small hematoma was noted in the right groin. Diagnosing physician: Curt Jews MD Electronically signed by Curt Jews MD on 05/21/2020 at 31:16:05 PM.   --------------------------------------------------------------------------------    Final    VAS [place] GROIN PSEUDOANEURYSM  Result Date: 05/20/2020  ARTERIAL PSEUDOANEURYSM  Patient Name:  [name]  Date of Exam:   05/20/2020 Medical Rec #: [id number]         Accession #:    [id number] Date of Birth: 1950/05/20        Patient Gender: M Patient Age:   69Y Exam [facility]:  [facility] Procedure:      VAS [place] Gloriajean Dell Referring Phys: [id number] [facility] SUE-ELLEN MATTHEWS --------------------------------------------------------------------------------  Exam: Right groin Indications: Patient complains of bruising, hematoma vs. pseudoaneurysm. History: S/p catheterization. Comparison Study: No prior studies. Performing Technologist: Darlin Coco RDMS,RVT  Examination Guidelines: A complete evaluation includes B-mode imaging, spectral Doppler, color Doppler, and power Doppler as needed of all accessible portions of each vessel. Bilateral testing is considered an integral part of a complete examination. Limited examinations for reoccurring indications may be performed as noted. +------------+----------+----------+------+----------+ Right DuplexPSV (cm/s) Waveform PlaqueComment(s) +------------+----------+----------+------+----------+ CFA             72    monophasic                 +------------+----------+----------+------+----------+ PFA             83    monophasic                 +------------+----------+----------+------+----------+ Prox SFA        66    monophasic                  +------------+----------+----------+------+----------+ Right Vein comments: Patent, WNL  Findings: Appearance of residual neck arising from the common femoral artery. The neck measures approximately 0.5 cm wide and 1.5 cm long. No evidence of hematoma or pseudoaneurysm.  Diagnosing physician: Ruta Hinds MD Electronically signed by Ruta Hinds MD on 05/20/2020 at 2:28:42 PM.   --------------------------------------------------------------------------------    Final  IR ANGIO VERTEBRAL SEL SUBCLAVIAN INNOMINATE UNI R MOD SED  Result Date: 05/23/2020 CLINICAL DATA:  History of symptomatic severe left internal carotid artery proximal stenosis. EXAM: INTRAVSC STENT CERV CAROTID W/ EMB-PROT COMPARISON:  Diagnostic catheter arteriogram of May 02, 2020. MEDICATIONS: Heparin 3,000 units IA. Ancef 2 g IV antibiotic was administered within 1 hour of the procedure. ANESTHESIA/SEDATION: MAC anesthesia as per the department of Anesthesiology at [facility]:  Isovue 300 approximately 90 mL. FLUOROSCOPY TIME:  Fluoroscopy Time: 41 minutes 0 seconds (1147 mGy). COMPLICATIONS: None immediate. TECHNIQUE: Informed written consent was obtained from the patient after a thorough discussion of the procedural risks, benefits and alternatives. All questions were addressed. Maximal Sterile Barrier Technique was utilized including caps, mask, sterile gowns, sterile gloves, sterile drape, hand hygiene and skin antiseptic. A timeout was performed prior to the initiation of the procedure. The right groin was prepped and draped in the usual sterile fashion. Thereafter using modified Seldinger technique, transfemoral access into the right common femoral artery was obtained without difficulty. Over a 0.035 inch guidewire, an 8 French 5 cm Pinnacle sheath was inserted. Through this, and also over 0.035 inch guidewire, a 5 [place] JB 1 catheter was advanced to the aortic arch region and selectively positioned in left  common carotid artery. FINDINGS: The left common carotid arteriogram demonstrates the left external carotid artery and its major branches to be widely patent. The left internal carotid artery just distal to the bulb has a severe high-grade stenosis secondary to a smooth arteriosclerotic plaque. No evidence of ulcerations or of intraluminal filling defects noted. The vessel is, otherwise, seen to opacify to the cranial skull base. The petrous, cavernous and supraclinoid segments are widely patent. The left middle cerebral artery opacifies into the capillary and venous phases. Again demonstrated is approximately 50-60% stenosis of the mid M1 segment. The left anterior cerebral artery opacifies into the capillary and venous phases. ENDOVASCULAR REVASCULARIZATION OF SEVERELY STENOTIC LEFT INTERNAL CAROTID ARTERY PROXIMALLY WITH DISTAL PROTECTION The diagnostic JB 1 catheter in the left common carotid artery was exchanged over a 0.035 inch 300 cm Rosen exchange guidewire for a 90 cm 6 [place] Cook shuttle sheath. The guidewire was removed. Good aspiration obtained from the hub of the [facility] shuttle sheath. A gentle control arteriogram performed through the sheath demonstrated no evidence of spasms, dissections or of intraluminal filling defects. Measurements were then performed of the left internal carotid artery its most normal segment just distal to the stenosis, and also at the cervical petrous junction. The left common carotid artery distally was also evaluated in the AP and lateral projections. A 4/7 French NAV Emboshield filter was then prepped and purged with heparinized saline infusion in its housing. After clearing the air antegradely and retrogradely, the filter device was captured into the microcatheter delivery system. The combination of the 0.014 inch wire, and the delivery catheter was retrieved. A J configuration was given to the distal end of the 0.014 inch micro guidewire. Using biplane roadmap technique  and constant fluoroscopic guidance in a coaxial manner and with constant heparinized saline infusion, the combination of the filter delivery system was then advanced to just distal to the [place] shuttle sheath. Using a torque device, the wire was advanced with the delivery microcatheter across the high-grade stenosis without event. The combination was advanced to the distal cervical segment at the cranial skull base. The wire was in the horizontal petrous segment. The Emboshield was then delivered in the usual manner  by retrieving the delivery microcatheter. This was then removed maintaining stability at the distal end of the micro guidewire. A control arteriogram performed through the [facility] shuttle sheath demonstrated no evidence of spasms, dissections or of the filling defects. A 6-8 mm x 40 mm Xact stent delivery system was then prepped and purged retrogradely with heparinized saline infusion. Using the rapid exchange technique, the stent delivery system was then advanced to the severe stenosis. The distal and proximal markers were then positioned adequate distance from the severe stenosis. Stent was then deployed in the usual manner without difficulty. The delivery apparatus was then retrieved and removed. A control arteriogram performed through the [facility] shuttle sheath demonstrated excellent apposition proximally and distally with modestly improved caliber of the previously noted high-grade stenosis. A 5 mm x 30 mm 0.014 Viatrac balloon was then prepped and purged anteriorly and retrogradely. Using the rapid exchange technique, this was again advanced to the left internal carotid artery proximally within the stent. The proximal and distal markers were aligned. A control inflation was then performed with a micro inflation syringe device via micro tubing slowly to 9.1 atmospheres where it was maintained for approximately 1 minute. The balloon was deflated and retrieved and removed maintaining safe position  of the tip of the distal Emboshield. A control arteriogram performed through the [facility] shuttle sheath demonstrated excellent apposition and nearly complete obliteration of the previously noted stenosis. More distally intracranially the distal internal carotid artery, and the left middle and left anterior cerebral artery demonstrated wide patency with again the left middle cerebral artery approximately 50-60% stenosis more regularly and clearly visible. An Emboshield capture microcatheter was then advanced again using the rapid exchange technique into the proximal marker on the Emboshield. This was then retrieved into the capture microcatheter by retrieving the micro guidewire. The combination was then retrieved under constant observation without any entanglement within the stent. Control arteriograms were then performed at 10, 20 and 30 minutes post stent angioplasty. These continued to demonstrate no evidence of intra stent filling defects or irregularities. However, patient was given a total of 3 mg of intra-arterial Integrilin in order to prevent platelet aggregation within the stent. Final control arteriogram performed through the [facility] shuttle sheath in the left common carotid artery continued to demonstrate excellent apposition of flow proximally within the stent, with no change intracranially in terms of intraluminal filling defects or of vessel occlusions. The [facility] shuttle sheath was then removed. 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 Following the manual compression, a small hematoma was noted which was flattened with manual compression. At the end of this, the groin puncture site appeared soft without any clinical evidence of pain. Distal pulses remained Dopplerable in both feet unchanged. The patient was then transferred to the PACU and then neuro ICU for overnight close neurologic observations,  maintaining controlled blood pressure, and IV heparin. Overnight, the patient remained stable. The following morning, the IV heparin was stopped and the patient was switched to aspirin 81 mg a day, and 75 mg. An ultrasound of the right groin obtained the day before right after the procedure, and the following morning continued to demonstrate stable hematoma in the right groin without evidence of pseudo aneurysm. Patient was then mobilized with help. He was able to take in a normal meal without event. 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 Neurologically, otherwise, continued  to have difficulty with word finding though slightly improved according to the patient. The patient did have visual apraxia being unable to read correct time on the clock on the wall. Patient was then discharged home under the care of his daughter. Of importance was the patient maintaining adequate hydration, and to avoid heavy stooping, bending or lifting weights above 10 pounds for approximately 14 days. Patient was advised not to drive either. Also the patient was asked to avoid heavy lifting and climbing up and down staircases. He was asked to maintained close monitoring of the right groin. Should he develop further bruising, swelling, or pain with weight-bearing he was asked to report to the ER. Patient and the daughter expressed understanding and agreement with the above management plan. IMPRESSION: Status post endovascular revascularization of severely stenotic symptomatic left internal carotid artery stenosis with distal protection with stent assisted angioplasty as described. PLAN: Follow-up in the clinic 2 weeks post discharge. Patient to take aspirin 81 mg a day, and Plavix 37.5 mg per day. Electronically Signed   By: Luanne Bras M.D.   On: 05/22/2020 10:00    Labs:  CBC: Recent Labs    05/16/20 1413 05/21/20 0601 05/31/20 1411  06/01/20 0231  WBC 6.2 6.0 8.5 6.5  HGB 13.1 9.5* 10.8* 9.9*  HCT 39.5 28.5* 33.0* 30.3*  PLT 273 201 364 371    COAGS: Recent Labs    03/15/20 1902 05/02/20 0650 05/16/20 1413  INR 0.9 1.0 0.9  APTT 33  --   --     BMP: Recent Labs    05/16/20 1413 05/21/20 0601 05/31/20 1411 06/01/20 0231  NA 135 135 135 136  K 4.2 3.8 4.0 3.5  CL 98 102 99 102  CO2 _0 GLUCOSE 117* 127* 111* 109*  BUN 24* 11 35* 31*  CALCIUM 10.5* 9.3 10.5* 9.6  CREATININE 1.16 0.87 1.52* 1.19  GFRNONAA >60 >60 49* >60    LIVER FUNCTION TESTS: Recent Labs    03/15/20 1902 05/31/20 1411  BILITOT 1.4* 2.0*  AST 24 26  ALT 25 26  ALKPHOS 68 62  PROT 8.8* 7.3  ALBUMIN 5.0 4.1    TUMOR MARKERS: No results for input(s): AFPTM, CEA, CA199, CHROMGRNA in the last 8760 hours.  Assessment and Plan:  Cerebral infarction due to embolism of left middle cerebral artery: Eshaan D. Simington, 70 year old male, is tentatively scheduled for a diagnostic cerebral arteriogram with possible intervention 06/04/20, pending anesthesia availability. Telephone consent was obtained from the patient's daughter, Arturo Morton. She is familiar with Dr. Estanislado Pandy and familiar with this procedure. The procedure was also discussed with the patient at the bedside and he is in agreement to proceed.   Risks and benefits of this procedure were discussed with the patient's daughter including, but not limited to bleeding, infection, vascular injury or contrast induced renal failure.  This interventional procedure involves the use of X-rays and because of the nature of the planned procedure, it is possible that we will have prolonged use of X-ray fluoroscopy.  Potential radiation risks to you include (but are not limited to) the following: - A slightly elevated risk for cancer  several years later in life. This risk is typically less than 0.5% percent. This risk is low in comparison to the normal incidence of  human cancer, which is 33% for women and 50% for men according to the [employer]. - Radiation induced injury can include skin redness, resembling a rash, tissue breakdown /  ulcers and hair loss (which can be temporary or permanent).   The likelihood of either of these occurring depends on the difficulty of the procedure and whether you are sensitive to radiation due to previous procedures, disease, or genetic conditions.   IF your procedure requires a prolonged use of radiation, you will be notified and given written instructions for further action.  It is your responsibility to monitor the irradiated area for the 2 weeks following the procedure and to notify your physician if you are concerned that you have suffered a radiation induced injury.    All of the patient's daughter's questions were answered and she is agreeable to proceed. Orders have been placed for the patient to be NPO at midnight 06/04/20; lab orders have also been placed.   Consent signed and in the IR APP office.  Thank you for this interesting consult.  I greatly enjoyed meeting KEYANTE DURIO and look forward to participating in their care.  A copy of this report was sent to the requesting provider on this date.  Electronically Signed: Soyla Dryer, AGACNP-BC [phone] 06/01/2020, 10:54 AM   I spent a total of 40 Minutes    in face to face in clinical consultation, greater than 50% of which was counseling/coordinating care for diagnostic cerebral arteriogram with possible intervention.

## 2020-06-01 NOTE — Evaluation (Signed)
Physical Therapy Evaluation Patient Details Name: [name] MRN: [id number] DOB: 25-May-1950 Today's Date: 06/01/2020   History of Present Illness  70 y.o. male with history of prior PCA stroke and placement of ICA stent for left ICA stenosis on 5/2 by interventional radiology, was found by his friend to be altered. Patient has some expressive aphasia at baseline.  MRI suggested a new stroke.  Clinical Impression  Pt admitted with above diagnosis. Mobility-wise patient functioning quite well and tolerates some higher level balance and gait challenges. States he was living with his daughter but recently moved back into his own home alone. Unsure of baseline cognition. His greatest barrier is cognition and lack of safety awareness. Pt impulsive, forgetful of IV in place attempting to walk in room. He does not show any overt LOB however great difficulty following directional commands. Unable to recall any steps to take in the event of a fire at home. My concern would be living alone and having an emergency - leave the stove on/ water running, attempt to drive etc.  Pt currently with functional limitations due to the deficits listed below (see PT Problem List). Will continue to follow and monitor for physical changes during admission.     Follow Up Recommendations Supervision - Intermittent (due to lack of safety awareness)    Equipment Recommendations  None recommended by PT    Recommendations for Other Services       Precautions / Restrictions Precautions Precautions: Fall Restrictions Weight Bearing Restrictions: No      Mobility  Bed Mobility Overal bed mobility: Needs Assistance Bed Mobility: Supine to Sit     Supine to sit: Supervision     General bed mobility comments: In recliner    Transfers Overall transfer level: Needs assistance Equipment used: None Transfers: Sit to/from [employer] Sit to Stand: Supervision Stand pivot transfers:  Supervision       General transfer comment: supervision for safety, impulsive with standing and attempting to walk while therapist requests to wait while attempting to manage lines/leads. No over difficulty or instability noted with transition from recliner.  Ambulation/Gait Ambulation/Gait assistance: Supervision Gait Distance (Feet): 185 Feet Assistive device: None Gait Pattern/deviations: Step-through pattern;Drifts right/left     General Gait Details: Supervision for safety. Tolerated higher level dynamic challenges however limited due to poor understanding and incorrect execution of commands. Pt able to turn head Lt and Rt with some drift noted but able to self stabilize. Difficulty following instructions for turning left/right, looking up/down, raising arms while walking, and answering simple safety questions while mobile.  Stairs            Wheelchair Mobility    Modified Rankin (Stroke Patients Only) Modified Rankin (Stroke Patients Only) Pre-Morbid Rankin Score: No significant disability Modified Rankin: Moderate disability     Balance Overall balance assessment: Mild deficits observed, not formally tested (due to impaired ability to follow instructions fully with some simple and most complex challenges.)                               Standardized Balance Assessment Standardized Balance Assessment :  (attempted DGI but cannot follow instructions fully.)           Pertinent Vitals/Pain Pain Assessment: No/denies pain    Home Living Family/patient expects to be discharged to:: Private residence Living Arrangements: Children (staying with his daughter per the patient.  Was living alone prior to this.) Available  Help at Discharge: Friend(s);Available PRN/intermittently;Family Type of Home: House Home Access: Stairs to enter Entrance Stairs-Rails: None   Home Layout: One level Home Equipment: None      Prior Function Level of Independence:  Independent         Comments: Patient with difficulty expressing his current living environment.  States, he has been staying with his daughter a for a few days.  Information above is in regards to his home environment.     Hand Dominance   Dominant Hand: Right    Extremity/Trunk Assessment   Upper Extremity Assessment Upper Extremity Assessment: Defer to OT evaluation    Lower Extremity Assessment Lower Extremity Assessment: Overall WFL for tasks assessed    Cervical / Trunk Assessment Cervical / Trunk Assessment: Normal  Communication   Communication: Receptive difficulties;Expressive difficulties  Cognition Arousal/Alertness: Awake/alert Behavior During Therapy: Impulsive Overall Cognitive Status: Difficult to assess                                 General Comments: He is following commands, but is a little impulsive.      General Comments General comments (skin integrity, edema, etc.): Pt inconsistent with following commands but demonstrates good stability, strength, and ambulatory abilites without assistive device.    Exercises     Assessment/Plan    PT Assessment Patient needs continued PT services  PT Problem List Decreased safety awareness;Decreased cognition       PT Treatment Interventions Functional mobility training;Therapeutic activities;Gait training;DME instruction;Stair training;Therapeutic exercise;Balance training;Neuromuscular re-education;Cognitive remediation;Patient/family education    PT Goals (Current goals can be found in the Care Plan section)  Acute Rehab PT Goals Patient Stated Goal: Is ready to return home PT Goal Formulation: Patient unable to participate in goal setting Time For Goal Achievement: 06/15/20 Potential to Achieve Goals: Good    Frequency Min 4X/week   Barriers to discharge   safety concerns without appropriate supervision    Co-evaluation               AM-PAC PT "6 Clicks" Mobility   Outcome Measure Help needed turning from your back to your side while in a flat bed without using bedrails?: None Help needed moving from lying on your back to sitting on the side of a flat bed without using bedrails?: None Help needed moving to and from a bed to a chair (including a wheelchair)?: None Help needed standing up from a chair using your arms (e.g., wheelchair or bedside chair)?: None Help needed to walk in hospital room?: None Help needed climbing 3-5 steps with a railing? : A Little 6 Click Score: 23    End of Session Equipment Utilized During Treatment: Gait belt Activity Tolerance: Patient tolerated treatment well Patient left: in chair;with call bell/phone within reach;with chair alarm set   PT Visit Diagnosis: Other symptoms and signs involving the nervous system (T70.177)    Time: 9390-3009 PT Time Calculation (min) (ACUTE ONLY): 13 min   Charges:   PT Evaluation $PT Eval Low Complexity: 1 Low          Charlsie Merles, PT, DPT  [place] 06/01/2020, 12:31 PM

## 2020-06-01 NOTE — Progress Notes (Addendum)
Pt is in MRI at this time.   Pt returned from MRI 0145AM

## 2020-06-01 NOTE — Progress Notes (Addendum)
STROKE TEAM PROGRESS NOTE   INTERVAL HISTORY No one is at the bedside.     Vitals:   06/01/20 0003 06/01/20 0348 06/01/20 0425 06/01/20 0749  BP: 94/62 (!) 81/47 (!) 85/53 (!) 100/59  Pulse: 63 63  60  Resp: 14 16    Temp: 97.8 F (36.6 C) 98 F (36.7 C)  98.2 F (36.8 C)  TempSrc: Oral Oral  Oral  SpO2: 98% 94%  100%   CBC:  Recent Labs  Lab 05/31/20 1411 06/01/20 0231  WBC 8.5 6.5  NEUTROABS 6.7  --   HGB 10.8* 9.9*  HCT 33.0* 30.3*  MCV 99.1 97.4  PLT 364 371   Basic Metabolic Panel:  Recent Labs  Lab 05/31/20 1411 06/01/20 0231  NA 135 136  K 4.0 3.5  CL 99 102  CO2 28 28  GLUCOSE 111* 109*  BUN 35* 31*  CREATININE 1.52* 1.19  CALCIUM 10.5* 9.6    Lipid Panel:  Recent Labs  Lab 06/01/20 0231  CHOL 92  TRIG 40  HDL 42  CHOLHDL 2.2  VLDL 8  LDLCALC 42    HgbA1c:  Recent Labs  Lab 06/01/20 0231  HGBA1C 5.9*   Urine Drug Screen:  Recent Labs  Lab 05/31/20 1458  LABOPIA NONE DETECTED  COCAINSCRNUR NONE DETECTED  LABBENZ NONE DETECTED  AMPHETMU NONE DETECTED  THCU NONE DETECTED  LABBARB NONE DETECTED    Alcohol Level No results for input(s): ETH in the last 168 hours.  IMAGING past 24 hours CT Head Wo Contrast  Result Date: 05/31/2020 CLINICAL DATA:  70 [facility] of confusion of unknown duration. EXAM: CT HEAD WITHOUT CONTRAST TECHNIQUE: Contiguous axial images were obtained from the base of the skull through the vertex without intravenous contrast. COMPARISON:  CT head 03/15/2020.  MRI brain 03/16/2020. FINDINGS: Brain: Geographic low attenuation involving the LEFT temporal lobe, LEFT posterior parietal lobe and the entire LEFT occipital lobe (within the distribution of the LEFT posterior cerebral artery and a portion of the LEFT middle cerebral artery) with sparing of a few peripheral gyri. The patient had a small stroke in this distribution on the prior MRI in February, 2022, but today  the distribution involves nearly the entire occipital lobe and part of the posterior parietal lobe. There is a small focus of acute hemorrhage inferiorly and medially in the occipital lobe (series 2, image 14 and series 4, image 42). At this point, there is no significant mass effect or midline shift. Ventricular system normal in size and appearance for age. Stable mild age related cortical atrophy. Vascular: Moderate BILATERAL carotid siphon and LEFT vertebral artery atherosclerosis. Hyperdense LEFT posterior cerebral artery (2/13). Skull: No skull fracture or other focal osseous abnormality involving the skull. Sinuses/Orbits: Visualized paranasal sinuses, bilateral mastoid air cells and bilateral middle ear cavities well-aerated. Visualized orbits and globes normal in appearance. Other: None. IMPRESSION: 1. 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 2. Hyperdense LEFT posterior cerebral artery. 3. Very small associated acute hemorrhage in the inferomedial occipital lobe. 4. No significant mass effect or midline shift at this time. I telephoned these critical results at the time of interpretation on 05/31/2020 at 3:08 pm to [facility], who verbally acknowledged these results. Electronically Signed   By: Hulan Saashomas  Lawrence M.D.   On:  05/31/2020 15:13   MR ANGIO HEAD WO CONTRAST  Result Date: 06/01/2020 CLINICAL DATA:  Follow-up examination for acute stroke. EXAM: MRI HEAD WITHOUT CONTRAST MRA HEAD WITHOUT CONTRAST MRA NECK WITHOUT AND WITH CONTRAST TECHNIQUE: Multiplanar, multi-echo pulse sequences of the brain and surrounding structures were acquired without intravenous contrast. Angiographic images of the Circle of Willis were acquired using MRA technique without intravenous contrast. Angiographic images of the neck  were acquired using MRA technique without and with intravenous contrast. Carotid stenosis measurements (when applicable) are obtained utilizing NASCET criteria, using the distal internal carotid diameter as the denominator. CONTRAST:  8.41mL GADAVIST GADOBUTROL 1 MMOL/ML IV SOLN COMPARISON:  Prior studies from 05/31/2020 as well as earlier. FINDINGS: MRI HEAD FINDINGS Brain: Generalized age-related cerebral atrophy. There has been interval evolution of previously identified left PCA distribution infarct, now late subacute to chronic in appearance. There are superimposed scattered areas of more acute appearing diffusion abnormality involving the left temporal occipital region, corresponding with abnormality on most recent CT. Associated hyperintense T2/FLAIR signal corresponds with the areas of diffusion abnormality, suggesting acute to early subacute ischemic change. Minimal patchy involvement of the left basal ganglia noted. Few scattered serpiginous foci of susceptibility artifact and T1 hyperintensity seen, consistent with mild petechial blood products and/or laminar necrosis. The preponderance of this finding is associated with the more chronic left PCA distribution infarct. No frank hemorrhagic transformation or significant regional mass effect. No other areas of acute or subacute infarction elsewhere within the brain. Gray-white matter differentiation otherwise maintained. No mass lesion or significant mass effect. No midline shift or hydrocephalus. No extra-axial fluid collection. Pituitary gland suprasellar region normal. Midline structures intact. Vascular: Irregular abnormal flow void within the left V4 segment, consistent with slow flow and/or occlusion (series 10, image 2). Left PCA is likely occluded. Major intracranial vascular flow voids are otherwise maintained. Skull and upper cervical spine: Craniocervical junction within normal limits. Bone marrow signal intensity normal. No scalp soft tissue  abnormality. Sinuses/Orbits: Globes and orbital soft tissues demonstrate no acute finding. Paranasal sinuses are largely clear. No significant mastoid effusion. Inner ear structures grossly normal. Other: None. MRA HEAD FINDINGS ANTERIOR CIRCULATION: Visualized distal cervical segments of both internal carotid arteries are patent with antegrade flow. Petrous, cavernous, and supraclinoid segments patent without flow-limiting stenosis. 2 mm focal outpouching extending laterally and slightly posteriorly from the cavernous left ICA likely reflects a small vascular infundibulum, seen on prior arteriogram (series 1, image 95). A1 segments patent bilaterally. Normal anterior communicating artery complex. Both ACAs patent to their distal aspects without stenosis. Short-segment severe mid left M1 stenosis (series 1031, image 11). Mild to moderate stenosis at the distal right M1 segment (series 1037, image 10). Normal MCA bifurcations. MCA branches well perfused although demonstrate diffuse small vessel atheromatous irregularity. POSTERIOR CIRCULATION: Right V4 segment mildly irregular but patent without significant stenosis by MRA. Severe stenosis involving the intracranial left V4 segment with markedly attenuated flow. There remains some persistent antegrade flow to the vertebrobasilar junction. Both PICA origins remain patent and well perfused. 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 Basilar widely patent distally. Superior cerebellar arteries patent bilaterally. Right PCA supplied via the basilar as well as a small right posterior communicating artery. Severe stenosis at the distal right P2/P3 junction (series 1043, image 16). Right PCA patent distally. There is occlusion of the left PCA at the proximal left P2 segment. A small left PCOM remains patent. MRA NECK FINDINGS AORTIC ARCH: Visualized aortic arch normal  in caliber. Bovine branching pattern noted. No stenosis about the  origin of the great vessels. RIGHT CAROTID SYSTEM: Right CCA patent to the bifurcation. No significant atheromatous irregularity or stenosis about the right carotid bulb. Right ICA patent distally without stenosis, evidence for dissection or occlusion. LEFT CAROTID SYSTEM: Left common carotid artery patent from its origin to the region of the left carotid bulb. 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 Area of signal loss measures 3.5 cm in craniocaudad dimension. Evaluation for potential intra stent stenosis limited by MRA, although robust flow seen within the ICA distal to the stent. No other stenosis, evidence for dissection or occlusion. VERTEBRAL ARTERIES: Both vertebral arteries arise from the subclavian arteries. Focal tortuosity with short-segment of approximately 50% stenosis at the proximal right subclavian artery, proximal to the takeoff of the right vertebral artery (series 1009, image 11), similar to prior CTA. Relatively mild 30% atheromatous stenosis at the origins of the vertebral arteries not significantly changed. Vertebral arteries otherwise patent within the neck without stenosis, evidence for dissection or occlusion. IMPRESSION: MRI HEAD IMPRESSION: 1. 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 Mild patchy involvement at the left basal ganglia. No frank hemorrhagic transformation or significant regional mass effect. 2. Interval evolution of superimposed/underlying left PCA distribution infarct, now late subacute to chronic in appearance. Associated susceptibility artifact consistent with petechial blood products and/or laminar necrosis. MRA HEAD IMPRESSION: 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 2. 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 3. Short-segment severe mid left M1 stenosis, with short-segment mild to moderate distal right M1 stenosis. 4. Distal small vessel atheromatous irregularity throughout the intracranial circulation. MRA NECK IMPRESSION: 1. 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 Evaluation for potential intra stent stenosis limited by MRA, although robust flow seen within the ICA distal to the stent. 2. Wide patency of the right carotid artery system without stenosis. 3. Relatively mild 30% atheromatous stenosis at the origins of the vertebral arteries. 4. Short-segment 50% stenosis involving the proximal right subclavian artery, proximal to the takeoff of the right vertebral artery. Electronically Signed   By: Rise Mu M.D.   On: 06/01/2020 04:49   MR ANGIO NECK W WO CONTRAST  Result Date: 06/01/2020 CLINICAL DATA:  Follow-up examination for acute stroke. EXAM: MRI HEAD WITHOUT CONTRAST MRA HEAD WITHOUT CONTRAST MRA NECK WITHOUT AND WITH CONTRAST TECHNIQUE: Multiplanar, multi-echo pulse sequences of the brain and surrounding structures were acquired without intravenous contrast. Angiographic images of the Circle of Willis were acquired using MRA technique without intravenous contrast. Angiographic images of the neck were acquired using MRA technique without and with intravenous contrast. Carotid stenosis measurements (when applicable) are obtained utilizing NASCET criteria, using the distal internal carotid diameter as the denominator. CONTRAST:  8.97mL GADAVIST GADOBUTROL 1 MMOL/ML IV SOLN COMPARISON:  Prior studies from 05/31/2020 as well as earlier. FINDINGS: MRI HEAD FINDINGS Brain: Generalized age-related cerebral atrophy. There has been interval evolution of previously identified left PCA distribution infarct, now late subacute to chronic in appearance. There are superimposed scattered areas of more acute appearing  diffusion abnormality involving the left temporal occipital region, corresponding with abnormality on most recent CT. Associated hyperintense T2/FLAIR signal corresponds with  the areas of diffusion abnormality, suggesting acute to early subacute ischemic change. Minimal patchy involvement of the left basal ganglia noted. Few scattered serpiginous foci of susceptibility artifact and T1 hyperintensity seen, consistent with mild petechial blood products and/or laminar necrosis. The preponderance of this finding is associated with the more chronic left PCA distribution infarct. No frank hemorrhagic transformation or significant regional mass effect. No other areas of acute or subacute infarction elsewhere within the brain. Gray-white matter differentiation otherwise maintained. No mass lesion or significant mass effect. No midline shift or hydrocephalus. No extra-axial fluid collection. Pituitary gland suprasellar region normal. Midline structures intact. Vascular: Irregular abnormal flow void within the left V4 segment, consistent with slow flow and/or occlusion (series 10, image 2). Left PCA is likely occluded. Major intracranial vascular flow voids are otherwise maintained. Skull and upper cervical spine: Craniocervical junction within normal limits. Bone marrow signal intensity normal. No scalp soft tissue abnormality. Sinuses/Orbits: Globes and orbital soft tissues demonstrate no acute finding. Paranasal sinuses are largely clear. No significant mastoid effusion. Inner ear structures grossly normal. Other: None. MRA HEAD FINDINGS ANTERIOR CIRCULATION: Visualized distal cervical segments of both internal carotid arteries are patent with antegrade flow. Petrous, cavernous, and supraclinoid segments patent without flow-limiting stenosis. 2 mm focal outpouching extending laterally and slightly posteriorly from the cavernous left ICA likely reflects a small vascular infundibulum, seen on prior arteriogram (series 1,  image 95). A1 segments patent bilaterally. Normal anterior communicating artery complex. Both ACAs patent to their distal aspects without stenosis. Short-segment severe mid left M1 stenosis (series 1031, image 11). Mild to moderate stenosis at the distal right M1 segment (series 1037, image 10). Normal MCA bifurcations. MCA branches well perfused although demonstrate diffuse small vessel atheromatous irregularity. POSTERIOR CIRCULATION: Right V4 segment mildly irregular but patent without significant stenosis by MRA. Severe stenosis involving the intracranial left V4 segment with markedly attenuated flow. There remains some persistent antegrade flow to the vertebrobasilar junction. Both PICA origins remain patent and well perfused. 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 Basilar widely patent distally. Superior cerebellar arteries patent bilaterally. Right PCA supplied via the basilar as well as a small right posterior communicating artery. Severe stenosis at the distal right P2/P3 junction (series 1043, image 16). Right PCA patent distally. There is occlusion of the left PCA at the proximal left P2 segment. A small left PCOM remains patent. MRA NECK FINDINGS AORTIC ARCH: Visualized aortic arch normal in caliber. Bovine branching pattern noted. No stenosis about the origin of the great vessels. RIGHT CAROTID SYSTEM: Right CCA patent to the bifurcation. No significant atheromatous irregularity or stenosis about the right carotid bulb. Right ICA patent distally without stenosis, evidence for dissection or occlusion. LEFT CAROTID SYSTEM: Left common carotid artery patent from its origin to the region of the left carotid bulb. 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 Area of signal loss measures 3.5 cm in craniocaudad dimension. Evaluation for potential intra stent stenosis limited by MRA, although robust flow  seen within the ICA distal to the stent. No other stenosis, evidence for dissection or occlusion. VERTEBRAL ARTERIES: Both vertebral arteries arise from the subclavian arteries. Focal tortuosity with short-segment of approximately 50% stenosis at the proximal right subclavian artery, proximal to the takeoff of the right vertebral artery (series 1009, image 11), similar to prior CTA. Relatively mild 30% atheromatous stenosis at the origins of the vertebral arteries not significantly changed. Vertebral  arteries otherwise patent within the neck without stenosis, evidence for dissection or occlusion. IMPRESSION: MRI HEAD IMPRESSION: 1. 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 Mild patchy involvement at the left basal ganglia. No frank hemorrhagic transformation or significant regional mass effect. 2. Interval evolution of superimposed/underlying left PCA distribution infarct, now late subacute to chronic in appearance. Associated susceptibility artifact consistent with petechial blood products and/or laminar necrosis. MRA HEAD IMPRESSION: 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 2. 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 3. Short-segment severe mid left M1 stenosis, with short-segment mild to moderate distal right M1 stenosis. 4. Distal small vessel atheromatous irregularity throughout the intracranial circulation. MRA NECK IMPRESSION: 1. 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 Evaluation for potential intra stent stenosis limited by MRA, although robust flow seen within the ICA distal to the stent. 2. Wide patency of the right carotid artery system without stenosis. 3. Relatively mild 30% atheromatous stenosis at the origins of the  vertebral arteries. 4. Short-segment 50% stenosis involving the proximal right subclavian artery, proximal to the takeoff of the right vertebral artery. Electronically Signed   By: Rise Mu M.D.   On: 06/01/2020 04:49   MR BRAIN WO CONTRAST  Result Date: 06/01/2020 CLINICAL DATA:  Follow-up examination for acute stroke. EXAM: MRI HEAD WITHOUT CONTRAST MRA HEAD WITHOUT CONTRAST MRA NECK WITHOUT AND WITH CONTRAST TECHNIQUE: Multiplanar, multi-echo pulse sequences of the brain and surrounding structures were acquired without intravenous contrast. Angiographic images of the Circle of Willis were acquired using MRA technique without intravenous contrast. Angiographic images of the neck were acquired using MRA technique without and with intravenous contrast. Carotid stenosis measurements (when applicable) are obtained utilizing NASCET criteria, using the distal internal carotid diameter as the denominator. CONTRAST:  8.11mL GADAVIST GADOBUTROL 1 MMOL/ML IV SOLN COMPARISON:  Prior studies from 05/31/2020 as well as earlier. FINDINGS: MRI HEAD FINDINGS Brain: Generalized age-related cerebral atrophy. There has been interval evolution of previously identified left PCA distribution infarct, now late subacute to chronic in appearance. There are superimposed scattered areas of more acute appearing diffusion abnormality involving the left temporal occipital region, corresponding with abnormality on most recent CT. Associated hyperintense T2/FLAIR signal corresponds with the areas of diffusion abnormality, suggesting acute to early subacute ischemic change. Minimal patchy involvement of the left basal ganglia noted. Few scattered serpiginous foci of susceptibility artifact and T1 hyperintensity seen, consistent with mild petechial blood products and/or laminar necrosis. The preponderance of this finding is associated with the more chronic left PCA distribution infarct. No frank hemorrhagic transformation or  significant regional mass effect. No other areas of acute or subacute infarction elsewhere within the brain. Gray-white matter differentiation otherwise maintained. No mass lesion or significant mass effect. No midline shift or hydrocephalus. No extra-axial fluid collection. Pituitary gland suprasellar region normal. Midline structures intact. Vascular: Irregular abnormal flow void within the left V4 segment, consistent with slow flow and/or occlusion (series 10, image 2). Left PCA is likely occluded. Major intracranial vascular flow voids are otherwise maintained. Skull and upper cervical spine: Craniocervical junction within normal limits. Bone marrow signal intensity normal. No scalp soft tissue abnormality. Sinuses/Orbits: Globes and orbital soft tissues demonstrate no acute finding. Paranasal sinuses are largely clear. No significant mastoid effusion. Inner ear structures  grossly normal. Other: None. MRA HEAD FINDINGS ANTERIOR CIRCULATION: Visualized distal cervical segments of both internal carotid arteries are patent with antegrade flow. Petrous, cavernous, and supraclinoid segments patent without flow-limiting stenosis. 2 mm focal outpouching extending laterally and slightly posteriorly from the cavernous left ICA likely reflects a small vascular infundibulum, seen on prior arteriogram (series 1, image 95). A1 segments patent bilaterally. Normal anterior communicating artery complex. Both ACAs patent to their distal aspects without stenosis. Short-segment severe mid left M1 stenosis (series 1031, image 11). Mild to moderate stenosis at the distal right M1 segment (series 1037, image 10). Normal MCA bifurcations. MCA branches well perfused although demonstrate diffuse small vessel atheromatous irregularity. POSTERIOR CIRCULATION: Right V4 segment mildly irregular but patent without significant stenosis by MRA. Severe stenosis involving the intracranial left V4 segment with markedly attenuated flow. There  remains some persistent antegrade flow to the vertebrobasilar junction. Both PICA origins remain patent and well perfused. 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 Basilar widely patent distally. Superior cerebellar arteries patent bilaterally. Right PCA supplied via the basilar as well as a small right posterior communicating artery. Severe stenosis at the distal right P2/P3 junction (series 1043, image 16). Right PCA patent distally. There is occlusion of the left PCA at the proximal left P2 segment. A small left PCOM remains patent. MRA NECK FINDINGS AORTIC ARCH: Visualized aortic arch normal in caliber. Bovine branching pattern noted. No stenosis about the origin of the great vessels. RIGHT CAROTID SYSTEM: Right CCA patent to the bifurcation. No significant atheromatous irregularity or stenosis about the right carotid bulb. Right ICA patent distally without stenosis, evidence for dissection or occlusion. LEFT CAROTID SYSTEM: Left common carotid artery patent from its origin to the region of the left carotid bulb. 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 Area of signal loss measures 3.5 cm in craniocaudad dimension. Evaluation for potential intra stent stenosis limited by MRA, although robust flow seen within the ICA distal to the stent. No other stenosis, evidence for dissection or occlusion. VERTEBRAL ARTERIES: Both vertebral arteries arise from the subclavian arteries. Focal tortuosity with short-segment of approximately 50% stenosis at the proximal right subclavian artery, proximal to the takeoff of the right vertebral artery (series 1009, image 11), similar to prior CTA. Relatively mild 30% atheromatous stenosis at the origins of the vertebral arteries not significantly changed. Vertebral arteries otherwise patent within the neck without stenosis, evidence for dissection or occlusion. IMPRESSION: MRI  HEAD IMPRESSION: 1. 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 Mild patchy involvement at the left basal ganglia. No frank hemorrhagic transformation or significant regional mass effect. 2. Interval evolution of superimposed/underlying left PCA distribution infarct, now late subacute to chronic in appearance. Associated susceptibility artifact consistent with petechial blood products and/or laminar necrosis. MRA HEAD IMPRESSION: 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 2. 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 3. Short-segment severe mid left M1 stenosis, with short-segment mild to moderate distal right M1 stenosis. 4. Distal small vessel atheromatous irregularity throughout the intracranial circulation. MRA NECK IMPRESSION: 1. 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 Evaluation for potential intra stent stenosis limited by MRA, although robust flow seen within the ICA  distal to the stent. 2. Wide patency of the right carotid artery system without stenosis. 3. Relatively mild 30% atheromatous stenosis at the origins of the vertebral arteries. 4. Short-segment 50% stenosis involving the proximal right subclavian artery, proximal to the takeoff of the right vertebral artery. Electronically Signed   By: Rise Mu M.D.   On: 06/01/2020 04:49   DG CHEST PORT 1 VIEW  Result Date: 05/31/2020 CLINICAL DATA:  Screening for metal prior to MRI. EXAM: PORTABLE CHEST 1 VIEW COMPARISON:  None. FINDINGS: Overlying monitoring devices in place. Lung volumes are low. No implanted medical device or radiopaque foreign body in the chest. Normal heart size. Aortic atherosclerosis and tortuosity. No pleural effusion or pneumothorax. No focal  airspace disease. No acute osseous abnormalities are seen. IMPRESSION: 1. No implanted medical device or radiopaque foreign body in the chest to preclude MRI imaging. 2. Low lung volumes without acute abnormality. Electronically Signed   By: Narda Rutherford M.D.   On: 05/31/2020 23:38   DG Abd Portable 1V  Result Date: 05/31/2020 CLINICAL DATA:  Screening for metal prior to MRI. EXAM: PORTABLE ABDOMEN - 1 VIEW COMPARISON:  None. FINDINGS: Overlying monitoring devices in place. No implanted medical device or radiopaque foreign body to preclude MRI imaging. Normal bowel gas pattern. Advanced vascular calcifications. No radiopaque calculi. No acute osseous abnormalities are seen. IMPRESSION: No implanted medical device or radiopaque foreign body to preclude MRI imaging. Electronically Signed   By: Narda Rutherford M.D.   On: 05/31/2020 23:40    PHYSICAL EXAM  General: pt seen sitting up in chair. Watching television. Nurse at bedside.  HENT: Normal oropharynx and mucosa. Normal external appearance of ears and nose but patient appears to be significantly hard of heaering.  Neck: Supple, no pain or tenderness  CV: No JVD. No peripheral edema.  Pulmonary: Symmetric Chest rise. Normal respiratory effort.  Abdomen: Soft to touch, non-tender.  Ext: No cyanosis, edema, or deformity  Skin: No rash. Normal palpation of skin.   Musculoskeletal: Normal digits and nails by inspection. No clubbing.   Neurologic Examination  Mental status/Cognition: Alert, oriented to self, place,    Speech/language: He has some degree of fluent aphasia, oftentimes will get derailed in the middle of a statement.  He is able to answer some questions to some degree but  Cranial nerves:   CN II Pupils equal and reactive to light, he has a dense right hemianopia   CN III,IV,VI EOM intact, no gaze preference or deviation, no nystagmus    CN V normal sensation in V1, V2, and V3 segments bilaterally    CN VII no asymmetry, no  nasolabial fold flattening    CN VIII normal hearing to speech    CN IX & X normal palatal elevation, no uvular deviation    CN XI 5/5 head turn and 5/5 shoulder shrug bilaterally    CN XII midline tongue protrusion    Motor:  Normal tone, 5/5 throughout   Sensation:  Light touch    Pin prick    Temperature    Vibration   Proprioception    Coordination/Complex Motor:  - Finger to Nose normal bilaterally   ASSESSMENT/PLAN [name] is a 70 y.o. 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 He presents with increasing confusion for the last couple of days  with decreased po intake. Pt is unable to provide assistance with HPI.   He was initially seen in [facility] and then immediately transferred to the ED where a CTH without contrast demonstrated a large left parietal occipital infarct.  MRI brain shows early subacute ischemic infarct at left temporoccipital region without hemorrhagic transformation, as well as evolution of left pca territory infarct. MRA head and neck shows chronically occluded lt PCA, as well severe stenosis at distal RIGHT P2 /3 junction, severe mid LEFT m1 stenosis, and moderate stenosis at RIGHT m1. MRA neck shows vascular stent but unable to characterize potential intra stent stenosis, patient right carotid artery system.      Stroke left temporoccipital region.   Code Stroke  CT head  05/31/20   very large left subacute stroke at posterior cerebral artery and left mca distributions.     Spoke with Dr. Corliss Skains regarding patient's admission. Plan for repeat angiogram on 06/03/20   MRI   Brain 06/01/2020  1. Acute to early subacute ischemic infarct involving the left  temporoccipital region, corresponding with abnormality seen on most  recent CT. Mild patchy involvement at the left  basal ganglia. No  frank hemorrhagic transformation or significant regional mass  effect.  2. Interval evolution of superimposed/underlying left PCA  distribution infarct, now late subacute to chronic in appearance.  Associated susceptibility artifact consistent with petechial blood  products and/or laminar necrosis.    MRA HEAD IMPRESSION:06/01/20  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 2. 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 3. Short-segment severe mid left M1 stenosis, with short-segment mild to moderate distal right M1 stenosis. 4. Distal small vessel atheromatous irregularity throughout the intracranial circulation.   MRA NECK IMPRESSION: 06/01/20  1. 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 Evaluation for potential intra stent stenosis limited by MRA, although robust flow seen within the ICA distal to the stent. 2. Wide patency of the right carotid artery system without stenosis. 3. Relatively mild 30% atheromatous stenosis at the origins of the vertebral arteries. 4. Short-segment 50% stenosis involving the proximal right subclavian artery, proximal to the takeoff of the right vertebral artery.   2D Echo 02/2020   LVEF 55-60%. Bubble study negative  - no shunt  LDL 42  HgbA1c 5.9  VTE prophylaxis - lovenox    Diet   Diet Heart Room service appropriate? Yes; Fluid consistency: Thin     aspirin 81 mg daily and clopidogrel 75 mg daily prior to admission, now on aspirin 81 mg daily and plavix 37.5mg .    Therapy recommendations:  pending  Disposition:  pending  Hypertension  Home meds:  Hctz, lisinopril  Stable . Permissive hypertension (OK if < 220/120) but gradually normalize in 5-7 days . Long-term BP goal normotensive  Hyperlipidemia  Home meds:  lipitor  80mg ,  resumed in hospital  LDL 42, goal < 70   Continue statin at discharge    HgbA1c 5.9, goal < 7.0  CBGs Recent Labs    05/31/20 1338  GLUCAP 107*      SSI  Other Stroke Risk Factors   Advanced Age >/= 58   Former Cigarette smoker    Hx stroke/TIA     Hospital day # 1  ATTENDING NOTE: I reviewed above note and agree with the assessment and plan. Pt was  seen and examined.   70 year old male with history of hypertension, diabetes, CAD, left PCA stroke in 02/2020 with residual alexia and right hemianopia, left ICA stenosis status post angioplasty and stenting on 05/20/2020 admitted for increasing confusion and speech difficulty.  CT head showed large left parieto-occipital infarct.  MRI showed acute to early subacute large left temporal occipital infarct, chronic left PCA infarct.  MRA head and neck showed chronic left proximal P2 occlusion, severe stenosis distal right P2/P3.  Short segment severe mid left M1 stenosis.  Left ICA stenting seems patent, however cannot rule out IntraStent stenosis.  Carotid Doppler right ICA 40 to 59% stenosis, left ICA no significant stenosis.  A1c 5.9, LDL 42.  On exam, patient sitting in chair, awake alert, spontaneous speech fluent, however, not able to answer orientation questions, perseverated on numbers.  Difficulty with finger naming, able to repeat, not able to read with alexia.  Not able to follow two-step commands.  Right hemianopia but improved, able to see medial half with the visual field on the right.  No gaze palsy, visual field fall, facial symmetrical, arm or leg motor or sensory intact.  Finger-to-nose intact bilaterally.  Etiology for patient left MCA new stroke likely due to left M1 stenosis.  Likely not related to left ICA stenting procedure given patient's symptom-free after procedure.  Carotid Doppler did not suggest left ICA in-stent stenosis.  Discussed with Dr. Corliss Skains, will plan for cerebral angiogram on Monday in  preparation for possible left MCA stenting.  Patient after left ICA stenting, P2Y12 at 9, his Plavix decreased to 37.5 mg daily.  Repeat P2Y12 today showed 135.  Will give extra Plavix 37.5 tonight, repeat P2Y12 in am and increase daily Plavix dose to 75 mg from tomorrow.  Marvel Plan, MD PhD Stroke Neurology 06/01/2020 8:40 PM       To contact Stroke Continuity provider, please refer to [email or web address]. After hours, contact General Neurology

## 2020-06-01 NOTE — Evaluation (Signed)
Speech Language Pathology Evaluation Patient Details Name: [name] MRN: [id number] DOB: 1950-05-28 Today's Date: 06/01/2020 Time: 3662-9476 SLP Time Calculation (min) (ACUTE ONLY): 22 min  Problem List:  Patient Active Problem List   Diagnosis Date Noted  . Stroke (cerebrum) (HCC) 05/31/2020  . Internal carotid artery stenosis, left 05/20/2020  . Acute stroke due to occlusion of left posterior cerebral artery (HCC) 03/16/2020  . Essential hypertension 03/16/2020  . Polycythemia 03/16/2020  . Hypercalcemia 03/16/2020  . Controlled type 2 diabetes mellitus without complication, without long-term current use of insulin (HCC) 03/16/2020   Past Medical History:  Past Medical History:  Diagnosis Date  . Hypertension   . Myocardial infarction (HCC)    1990's  . Stroke [facility]) 03/15/2020   Past Surgical History:  Past Surgical History:  Procedure Laterality Date  . IR ANGIO INTRA EXTRACRAN SEL COM CAROTID INNOMINATE BILAT MOD SED  05/02/2020  . IR ANGIO VERTEBRAL SEL SUBCLAVIAN INNOMINATE UNI L MOD SED  05/02/2020  . IR ANGIO VERTEBRAL SEL SUBCLAVIAN INNOMINATE UNI R MOD SED  05/20/2020  . IR ANGIO VERTEBRAL SEL VERTEBRAL UNI R MOD SED  05/02/2020  . IR INTRAVSC STENT CERV CAROTID W/EMB-PROT MOD SED INCL ANGIO  05/20/2020  . IR RADIOLOGIST EVAL & MGMT  03/27/2020  . IR US GUIDE VASC ACCESS RIGHT  05/02/2020  . IR US GUIDE VASC ACCESS RIGHT  05/20/2020  . RADIOLOGY WITH ANESTHESIA Left 05/20/2020   Procedure: RADIOLOGY WITH ANESTHESIA  LEFT ICA STENTING;  Surgeon: Julieanne Cotton, MD;  Location: [facility] OR;  Service: Radiology;  Laterality: Left;   HPI:  70 y.o. male admitted with new CVA.  PMHx prior left PCA stoke, placement of ICA stent for left ICA stenosis 5/2. Baseline expressive aphasia. MRI: late subacute to chronic appearance of left PCA distribution infarct; acute-appearing abnormality left temporal-occipital region. Patchy involvement left basal ganglia.   Assessment / Plan /  Recommendation Clinical Impression  Pt presents with a significant and primary receptive aphasia (most consistent with a Wernicke's type though not a pure wernicke's). His speech is clear, output is fluent.  Comprehension is marked by poor accuracy when answering yes/no questions - biographical and after listening to short paragraph. He followed one-step commands; two-step commands were followed with 30% accuracy.  He had difficulty with single word and sentence repetition. He had significant difficulty discriminating body parts/left vs right - e.g, when asked to point to right ear or left shoulder, his accuracy was 0% over six trials.  Oral reading of single words was significantly impaired.  Naming objects led to evidence of semantic and phonemic paraphasias. Poor performance with divergent naming task - e.g., naming animals "pepper, feet, animals." Pt's output was vaguely related to auditory input. Comprehension improved when topics were social/overlearned and predictable (e.g, about toileting, eating, weather). However, when topic was out of context and when there was no accompanying visual information, pt's understanding was quite impaired. He showed fluctating recognition of difficulty - but talked primarily about what he intimated were visual deficits.  Given his high-level physical functioning, he will not qualify for CIR, however he needs intensive aphasia therapy.  This type of aphasia can be easily misinterpreted and impairments often overlooked. He will need 24 hour supervision for safety and OP SLP intervention.    SLP Assessment  SLP Recommendation/Assessment: Patient needs continued Speech Lanaguage Pathology Services SLP Visit Diagnosis: Aphasia (R47.01)    Follow Up Recommendations  Outpatient SLP    Frequency and Duration min 3x week  1 week      SLP Evaluation Cognition  Overall Cognitive Status: History of cognitive impairments - at baseline Behaviors:  Impulsive Safety/Judgment: Impaired       Comprehension  Auditory Comprehension Overall Auditory Comprehension: Impaired Yes/No Questions: Impaired Basic Immediate Environment Questions: 50-74% accurate Commands: Impaired One Step Basic Commands: 75-100% accurate Two Step Basic Commands: 0-24% accurate Conversation: Simple EffectiveTechniques: Extra processing time Visual Recognition/Discrimination Discrimination: Exceptions to [facility] L/R Discrimination: Unable to indentify Reading Comprehension Reading Status: Impaired Word level: Impaired    Expression Expression Primary Mode of Expression: Verbal Verbal Expression Overall Verbal Expression: Impaired Initiation: No impairment Level of Generative/Spontaneous Verbalization: Conversation Repetition: Impaired Level of Impairment: Word level Naming: Impairment Responsive: 0-25% accurate Confrontation: Impaired Verbal Errors: Semantic paraphasias;Phonemic paraphasias;Not aware of errors Pragmatics: Impairment Written Expression Dominant Hand: Right   Oral / Motor  Oral Motor/Sensory Function Overall Oral Motor/Sensory Function: Within functional limits Motor Speech Overall Motor Speech: Appears within functional limits for tasks assessed   GO                    [name] 06/01/2020, 2:29 PM  [name] L. [name], MA CCC/SLP Acute Rehabilitation Services Office number [phone] Pager [phone]

## 2020-06-01 NOTE — Evaluation (Signed)
Occupational Therapy Evaluation Patient Details Name: [name] MRN: [id number] DOB: 21-Jan-1950 Today's Date: 06/01/2020    History of Present Illness Per MD report: 70 y.o. male with history of prior PCA stroke and placement of ICA stent for left ICA stenosis on 5/2 by interventional radiology, was found by his friend to be altered.  Patient has some expressive aphasia at baseline.  MRI suggested a new stroke.   Clinical Impression   Patient admitted for the above diagnosis.  PTA he expresses staying with his daughter, but its not totally clear this is accurate.  He does own his home, and according to him was independent with all care and mobility.  Patient states he is not supposed to drive, but has in the remote past.  Primary barrier is linguist and safety concerns, but he is essentially supervision for all mobility and self care.  OT will follow in the acute setting to ensure max functional potential.      Follow Up Recommendations  No OT follow up;Supervision - Intermittent    Equipment Recommendations  None recommended by OT    Recommendations for Other Services       Precautions / Restrictions Precautions Precautions: Fall Restrictions Weight Bearing Restrictions: No      Mobility Bed Mobility Overal bed mobility: Needs Assistance Bed Mobility: Supine to Sit     Supine to sit: Supervision          Transfers Overall transfer level: Needs assistance Equipment used: None Transfers: Sit to/from Stand;Stand Pivot Transfers Sit to Stand: Supervision Stand pivot transfers: Supervision            Balance Overall balance assessment: No apparent balance deficits (not formally assessed)                                         ADL either performed or assessed with clinical judgement   ADL Overall ADL's : Needs assistance/impaired                                       General ADL Comments: patient needing setup and  supervision for lines and leads due to decreased safety and insight to lines.  No physical help needed.     Vision Baseline Vision/History: Wears glasses Wears Glasses: At all times Patient Visual Report: No change from baseline       Perception     Praxis      Pertinent Vitals/Pain Pain Assessment: No/denies pain     Hand Dominance Right   Extremity/Trunk Assessment Upper Extremity Assessment Upper Extremity Assessment: Overall WFL for tasks assessed   Lower Extremity Assessment Lower Extremity Assessment: Defer to PT evaluation   Cervical / Trunk Assessment Cervical / Trunk Assessment: Normal   Communication Communication Communication: Receptive difficulties;Expressive difficulties   Cognition Arousal/Alertness: Awake/alert Behavior During Therapy: Impulsive Overall Cognitive Status: Difficult to assess                                 General Comments: He is following commands, but is a little impulsive.                    Home Living Family/patient expects to be discharged to:: Private residence Living Arrangements: Children (staying  with his daughter per the patient.  Was living alone prior to this.) Available Help at Discharge: Friend(s);Available PRN/intermittently;Family Type of Home: House Home Access: Stairs to enter   Entrance Stairs-Rails: None Home Layout: One level     Bathroom Shower/Tub: Chief Strategy Officer: Standard     Home Equipment: None          Prior Functioning/Environment Level of Independence: Independent        Comments: Patient with difficulty expressing his currenlt living environment.  States, he has been staying with his daughter a for a few days.  Information above is in regards to his home environment.        OT Problem List: Decreased safety awareness      OT Treatment/Interventions: Self-care/ADL training;DME and/or AE instruction;Balance training;Cognitive  remediation/compensation;Therapeutic activities    OT Goals(Current goals can be found in the care plan section) Acute Rehab OT Goals Patient Stated Goal: Is ready to return home OT Goal Formulation: With patient Time For Goal Achievement: 06/15/20 Potential to Achieve Goals: Good  OT Frequency: Min 2X/week   Barriers to D/C:    none noted       Co-evaluation              AM-PAC OT "6 Clicks" Daily Activity     Outcome Measure Help from another person eating meals?: None Help from another person taking care of personal grooming?: None Help from another person toileting, which includes using toliet, bedpan, or urinal?: None Help from another person bathing (including washing, rinsing, drying)?: None Help from another person to put on and taking off regular upper body clothing?: None Help from another person to put on and taking off regular lower body clothing?: None 6 Click Score: 24   End of Session Nurse Communication: Mobility status  Activity Tolerance: Patient tolerated treatment well Patient left: in chair;with call bell/phone within reach;with chair alarm set  OT Visit Diagnosis: Other symptoms and signs involving cognitive function                Time: 0940-1008 OT Time Calculation (min): 28 min Charges:  OT General Charges $OT Visit: 1 Visit OT Evaluation $OT Eval Moderate Complexity: 1 Mod OT Treatments $Self Care/Home Management : 8-22 mins  06/01/2020  Rich, OTR/L  Acute Rehabilitation Services  Office:  [phone]   Suzanna Obey 06/01/2020, 10:25 AM

## 2020-06-01 NOTE — Progress Notes (Signed)
[facility] PROGRESS NOTE   CRYSTAL ELLWOOD WUJ:[id number] DOB: [birth year] DOA: 05/31/2020  PCP: Roger Kill, PA-C  Brief History/Interval Summary: 70 y.o. male with history of prior PCA stroke and placement of ICA stent for left ICA stenosis on 5/2 by interventional radiology, was found by his friend today to be altered.  He was also noted to have decreased oral intake.  Patient has some expressive aphasia at baseline.  Patient was hospitalized for further management.  MRI suggested a new stroke.  Reason for Visit: Acute stroke  Consultants: Neurology  Procedures: None yet  Antibiotics: Anti-infectives (From admission, onward)   None      Subjective/Interval History: Patient denies any headaches this morning.  Denies any dizziness or lightheadedness.  No chest pain.  No shortness of breath.  No nausea or vomiting.    Assessment/Plan:  Acute stroke MRI shows an acute to early subacute ischemic infarct involving the left temporal occipital region.  Patient has had multiple strokes in the last few months.  He underwent left ICA stent placement on 5/2 which was done by interventional radiology.  Patient was supposed to be on aspirin and Plavix which is being continued here.   LDL 42.  HbA1c 5.9.  Patient noted to be on statin at home. PT OT SLP evaluation. Patient seems to be more talkative today. Neurology has been consulted.  Further management per them.  Hypotension in the setting of known history of hypertension It appears that he was on HCTZ and lisinopril at home which have both been held due to borderline low blood pressures.  He has been getting IV fluids.  Does not appear to be particularly symptomatic due to the low blood pressure.  Continue to monitor closely.  Acute kidney injury Came in with a creatinine of 1.5.  Baseline appears to be 0.87.  Baseline around 0.8.  Per history he has had poor oral intake.  Could have been dehydrated.  Started on IV  fluids.  Creatinine noted to be improved today at 1.19.  Monitor urine output.  Replace potassium.  Normocytic anemia Hemoglobin was 13.1 in April.  Over the last 2 weeks it has been in the 9-10 range.  No overt bleeding noted.  We will check anemia panel.  Check stool for occult blood.  Diabetes mellitus type 2, diet controlled HbA1c 5.9.  Not noted to be on any glucose lowering agents at home.   DVT Prophylaxis: Lovenox Code Status: Full code Family Communication: No family at bedside Disposition Plan: To be determined  Status is: Inpatient  Remains inpatient appropriate because:Ongoing diagnostic testing needed not appropriate for outpatient work up, IV treatments appropriate due to intensity of illness or inability to take PO and Inpatient level of care appropriate due to severity of illness   Dispo: The patient is from: Home              Anticipated d/c is to: To be determined              Patient currently is not medically stable to d/c.   Difficult to place patient No      Medications:  Scheduled: .  stroke: mapping our early stages of recovery book   Does not apply Once  . aspirin  81 mg Oral Daily  . clopidogrel  37.5 mg Oral Daily  . enoxaparin (LOVENOX) injection  40 mg Subcutaneous Q24H   Continuous: . sodium chloride 125 mL/hr at 05/31/20 2137   NFA:[id number] **OR** acetaminophen (  TYLENOL) oral liquid 160 mg/5 mL **OR** acetaminophen   Objective:  Vital Signs  Vitals:   06/01/20 0003 06/01/20 0348 06/01/20 0425 06/01/20 0749  BP: 94/62 (!) 81/47 (!) 85/53 (!) 100/59  Pulse: 63 63  60  Resp: 14 16    Temp: 97.8 F (36.6 C) 98 F (36.7 C)  98.2 F (36.8 C)  TempSrc: Oral Oral  Oral  SpO2: 98% 94%  100%    Intake/Output Summary (Last 24 hours) at 06/01/2020 0939 Last data filed at 06/01/2020 0400 Gross per 24 hour  Intake 1814 ml  Output --  Net 1814 ml   There were no vitals filed for this visit.  General appearance: Awake alert.  In no  distress Resp: Clear to auscultation bilaterally.  Normal effort Cardio: S1-S2 is normal regular.  No S3-S4.  No rubs murmurs or bruit GI: Abdomen is soft.  Nontender nondistended.  Bowel sounds are present normal.  No masses organomegaly Extremities: No edema.  Moving all of his extremities Neurologic: Awake alert.  Has some expressive aphasia.  No facial asymmetry noted.  Moving all of his extremities equally    Lab Results:  Data Reviewed: I have personally reviewed following labs and imaging studies  CBC: Recent Labs  Lab 05/31/20 1411 06/01/20 0231  WBC 8.5 6.5  NEUTROABS 6.7  --   HGB 10.8* 9.9*  HCT 33.0* 30.3*  MCV 99.1 97.4  PLT 364 371    Basic Metabolic Panel: Recent Labs  Lab 05/31/20 1411 06/01/20 0231  NA 135 136  K 4.0 3.5  CL 99 102  CO2 28 28  GLUCOSE 111* 109*  BUN 35* 31*  CREATININE 1.52* 1.19  CALCIUM 10.5* 9.6    GFR: Estimated Creatinine Clearance: 60.5 mL/min (by C-G formula based on SCr of 1.19 mg/dL).  Liver Function Tests: Recent Labs  Lab 05/31/20 1411  AST 26  ALT 26  ALKPHOS 62  BILITOT 2.0*  PROT 7.3  ALBUMIN 4.1     HbA1C: Recent Labs    06/01/20 0231  HGBA1C 5.9*    CBG: Recent Labs  Lab 05/31/20 1338  GLUCAP 107*    Lipid Profile: Recent Labs    06/01/20 0231  CHOL 92  HDL 42  LDLCALC 42  TRIG 40  CHOLHDL 2.2     Recent Results (from the past 240 hour(s))  Resp Panel by RT-PCR (Flu A&B, Covid)     Status: None   Collection Time: 05/31/20  3:34 PM  Result Value Ref Range Status   SARS Coronavirus 2 by RT PCR NEGATIVE NEGATIVE Final    Comment: (NOTE) SARS-CoV-2 target nucleic acids are NOT DETECTED.  The SARS-CoV-2 RNA is generally detectable in upper respiratory specimens during the acute phase of infection. The lowest concentration of SARS-CoV-2 viral copies this assay can detect is 138 copies/mL. A negative result does not preclude SARS-Cov-2 infection and should not be used as the sole  basis for treatment or other patient management decisions. A negative result may occur with  improper specimen collection/handling, submission of specimen other than nasopharyngeal swab, presence of viral mutation(s) within the areas targeted by this assay, and inadequate number of viral copies(<138 copies/mL). A negative result must be combined with clinical observations, patient history, and epidemiological information. The expected result is Negative.  Fact Sheet for Patients:  BloggerCourse.com  Fact Sheet for Healthcare Providers:  SeriousBroker.it  This test is no t yet approved or cleared by the [place] FDA and  has been  authorized for detection and/or diagnosis of SARS-CoV-2 by FDA under an Emergency Use Authorization (EUA). This EUA will remain  in effect (meaning this test can be used) for the duration of the COVID-19 declaration under Section 564(b)(1) of the Act, 21 U.S.C.section 360bbb-3(b)(1), unless the authorization is terminated  or revoked sooner.       Influenza A by PCR NEGATIVE NEGATIVE Final   Influenza B by PCR NEGATIVE NEGATIVE Final    Comment: (NOTE) The Xpert Xpress SARS-CoV-2/FLU/RSV plus assay is intended as an aid in the diagnosis of influenza from Nasopharyngeal swab specimens and should not be used as a sole basis for treatment. Nasal washings and aspirates are unacceptable for Xpert Xpress SARS-CoV-2/FLU/RSV testing.  Fact Sheet for Patients: BloggerCourse.com  Fact Sheet for Healthcare Providers: SeriousBroker.it  This test is not yet approved or cleared by the [place] FDA and has been authorized for detection and/or diagnosis of SARS-CoV-2 by FDA under an Emergency Use Authorization (EUA). This EUA will remain in effect (meaning this test can be used) for the duration of the COVID-19 declaration under Section 564(b)(1) of the Act,  21 U.S.C. section 360bbb-3(b)(1), unless the authorization is terminated or revoked.  Performed at [facility], [address] [place], [place] [address]       Radiology Studies: CT Head Wo Contrast  Result Date: 05/31/2020 CLINICAL DATA:  70 year old sent from [facility] for evaluation of confusion of unknown duration. EXAM: CT HEAD WITHOUT CONTRAST TECHNIQUE: Contiguous axial images were obtained from the base of the skull through the vertex without intravenous contrast. COMPARISON:  CT head 03/15/2020.  MRI brain 03/16/2020. FINDINGS: Brain: Geographic low attenuation involving the LEFT temporal lobe, LEFT posterior parietal lobe and the entire LEFT occipital lobe (within the distribution of the LEFT posterior cerebral artery and a portion of the LEFT middle cerebral artery) with sparing of a few peripheral gyri. The patient had a small stroke in this distribution on the prior MRI in February, 2022, but today the distribution involves nearly the entire occipital lobe and part of the posterior parietal lobe. There is a small focus of acute hemorrhage inferiorly and medially in the occipital lobe (series 2, image 14 and series 4, image 42). At this point, there is no significant mass effect or midline shift. Ventricular system normal in size and appearance for age. Stable mild age related cortical atrophy. Vascular: Moderate BILATERAL carotid siphon and LEFT vertebral artery atherosclerosis. Hyperdense LEFT posterior cerebral artery (2/13). Skull: No skull fracture or other focal osseous abnormality involving the skull. Sinuses/Orbits: Visualized paranasal sinuses, bilateral mastoid air cells and bilateral middle ear cavities well-aerated. Visualized orbits and globes normal in appearance. Other: None. IMPRESSION: 1. Very large acute/subacute stroke in the LEFT posterior cerebral artery and LEFT middle cerebral artery distributions, with involvement of the LEFT temporal  lobe, LEFT posterior parietal lobe and the entire LEFT occipital lobe. 2. Hyperdense LEFT posterior cerebral artery. 3. Very small associated acute hemorrhage in the inferomedial occipital lobe. 4. No significant mass effect or midline shift at this time. I telephoned these critical results at the time of interpretation on 05/31/2020 at 3:08 pm to provider [facility], who verbally acknowledged these results. Electronically Signed   By: Hulan Saas M.D.   On: 05/31/2020 15:13   MR ANGIO HEAD WO CONTRAST  Result Date: 06/01/2020 CLINICAL DATA:  Follow-up examination for acute stroke. EXAM: MRI HEAD WITHOUT CONTRAST MRA HEAD WITHOUT CONTRAST MRA NECK WITHOUT AND WITH  CONTRAST TECHNIQUE: Multiplanar, multi-echo pulse sequences of the brain and surrounding structures were acquired without intravenous contrast. Angiographic images of the Circle of Willis were acquired using MRA technique without intravenous contrast. Angiographic images of the neck were acquired using MRA technique without and with intravenous contrast. Carotid stenosis measurements (when applicable) are obtained utilizing NASCET criteria, using the distal internal carotid diameter as the denominator. CONTRAST:  8.13mL GADAVIST GADOBUTROL 1 MMOL/ML IV SOLN COMPARISON:  Prior studies from 05/31/2020 as well as earlier. FINDINGS: MRI HEAD FINDINGS Brain: Generalized age-related cerebral atrophy. There has been interval evolution of previously identified left PCA distribution infarct, now late subacute to chronic in appearance. There are superimposed scattered areas of more acute appearing diffusion abnormality involving the left temporal occipital region, corresponding with abnormality on most recent CT. Associated hyperintense T2/FLAIR signal corresponds with the areas of diffusion abnormality, suggesting acute to early subacute ischemic change. Minimal patchy involvement of the left basal ganglia noted. Few scattered serpiginous foci of  susceptibility artifact and T1 hyperintensity seen, consistent with mild petechial blood products and/or laminar necrosis. The preponderance of this finding is associated with the more chronic left PCA distribution infarct. No frank hemorrhagic transformation or significant regional mass effect. No other areas of acute or subacute infarction elsewhere within the brain. Gray-white matter differentiation otherwise maintained. No mass lesion or significant mass effect. No midline shift or hydrocephalus. No extra-axial fluid collection. Pituitary gland suprasellar region normal. Midline structures intact. Vascular: Irregular abnormal flow void within the left V4 segment, consistent with slow flow and/or occlusion (series 10, image 2). Left PCA is likely occluded. Major intracranial vascular flow voids are otherwise maintained. Skull and upper cervical spine: Craniocervical junction within normal limits. Bone marrow signal intensity normal. No scalp soft tissue abnormality. Sinuses/Orbits: Globes and orbital soft tissues demonstrate no acute finding. Paranasal sinuses are largely clear. No significant mastoid effusion. Inner ear structures grossly normal. Other: None. MRA HEAD FINDINGS ANTERIOR CIRCULATION: Visualized distal cervical segments of both internal carotid arteries are patent with antegrade flow. Petrous, cavernous, and supraclinoid segments patent without flow-limiting stenosis. 2 mm focal outpouching extending laterally and slightly posteriorly from the cavernous left ICA likely reflects a small vascular infundibulum, seen on prior arteriogram (series 1, image 95). A1 segments patent bilaterally. Normal anterior communicating artery complex. Both ACAs patent to their distal aspects without stenosis. Short-segment severe mid left M1 stenosis (series 1031, image 11). Mild to moderate stenosis at the distal right M1 segment (series 1037, image 10). Normal MCA bifurcations. MCA branches well perfused although  demonstrate diffuse small vessel atheromatous irregularity. POSTERIOR CIRCULATION: Right V4 segment mildly irregular but patent without significant stenosis by MRA. Severe stenosis involving the intracranial left V4 segment with markedly attenuated flow. There remains some persistent antegrade flow to the vertebrobasilar junction. Both PICA origins remain patent and well perfused. 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 Basilar widely patent distally. Superior cerebellar arteries patent bilaterally. Right PCA supplied via the basilar as well as a small right posterior communicating artery. Severe stenosis at the distal right P2/P3 junction (series 1043, image 16). Right PCA patent distally. There is occlusion of the left PCA at the proximal left P2 segment. A small left PCOM remains patent. MRA NECK FINDINGS AORTIC ARCH: Visualized aortic arch normal in caliber. Bovine branching pattern noted. No stenosis about the origin of the great vessels. RIGHT CAROTID SYSTEM: Right CCA patent to the bifurcation. No significant atheromatous irregularity or stenosis about the right carotid bulb.  Right ICA patent distally without stenosis, evidence for dissection or occlusion. LEFT CAROTID SYSTEM: Left common carotid artery patent from its origin to the region of the left carotid bulb. 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 Area of signal loss measures 3.5 cm in craniocaudad dimension. Evaluation for potential intra stent stenosis limited by MRA, although robust flow seen within the ICA distal to the stent. No other stenosis, evidence for dissection or occlusion. VERTEBRAL ARTERIES: Both vertebral arteries arise from the subclavian arteries. Focal tortuosity with short-segment of approximately 50% stenosis at the proximal right subclavian artery, proximal to the takeoff of the right vertebral artery (series 1009, image  11), similar to prior CTA. Relatively mild 30% atheromatous stenosis at the origins of the vertebral arteries not significantly changed. Vertebral arteries otherwise patent within the neck without stenosis, evidence for dissection or occlusion. IMPRESSION: MRI HEAD IMPRESSION: 1. 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 Mild patchy involvement at the left basal ganglia. No frank hemorrhagic transformation or significant regional mass effect. 2. Interval evolution of superimposed/underlying left PCA distribution infarct, now late subacute to chronic in appearance. Associated susceptibility artifact consistent with petechial blood products and/or laminar necrosis. MRA HEAD IMPRESSION: 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 2. 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 3. Short-segment severe mid left M1 stenosis, with short-segment mild to moderate distal right M1 stenosis. 4. Distal small vessel atheromatous irregularity throughout the intracranial circulation. MRA NECK IMPRESSION: 1. 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 Evaluation for potential intra stent stenosis limited by MRA, although robust flow seen within the ICA distal to the stent. 2. Wide patency of the right carotid artery system without stenosis. 3. Relatively mild 30% atheromatous stenosis at the origins of the vertebral arteries. 4. Short-segment 50% stenosis involving the proximal right subclavian artery, proximal to the takeoff of the right vertebral artery. Electronically Signed   By: Rise Mu M.D.   On: 06/01/2020 04:49   MR ANGIO NECK W WO CONTRAST  Result Date: 06/01/2020 CLINICAL DATA:  Follow-up examination for acute stroke. EXAM: MRI HEAD  WITHOUT CONTRAST MRA HEAD WITHOUT CONTRAST MRA NECK WITHOUT AND WITH CONTRAST TECHNIQUE: Multiplanar, multi-echo pulse sequences of the brain and surrounding structures were acquired without intravenous contrast. Angiographic images of the Circle of Willis were acquired using MRA technique without intravenous contrast. Angiographic images of the neck were acquired using MRA technique without and with intravenous contrast. Carotid stenosis measurements (when applicable) are obtained utilizing NASCET criteria, using the distal internal carotid diameter as the denominator. CONTRAST:  8.28mL GADAVIST GADOBUTROL 1 MMOL/ML IV SOLN COMPARISON:  Prior studies from 05/31/2020 as well as earlier. FINDINGS: MRI HEAD FINDINGS Brain: Generalized age-related cerebral atrophy. There has been interval evolution of previously identified left PCA distribution infarct, now late subacute to chronic in appearance. There are superimposed scattered areas of more acute appearing diffusion abnormality involving the left temporal occipital region, corresponding with abnormality on most recent CT. Associated hyperintense T2/FLAIR signal corresponds with the areas of diffusion abnormality, suggesting acute to early subacute ischemic change. Minimal patchy involvement of the left basal ganglia noted. Few scattered serpiginous foci of susceptibility artifact and T1 hyperintensity seen, consistent with mild petechial  blood products and/or laminar necrosis. The preponderance of this finding is associated with the more chronic left PCA distribution infarct. No frank hemorrhagic transformation or significant regional mass effect. No other areas of acute or subacute infarction elsewhere within the brain. Gray-white matter differentiation otherwise maintained. No mass lesion or significant mass effect. No midline shift or hydrocephalus. No extra-axial fluid collection. Pituitary gland suprasellar region normal. Midline structures intact. Vascular:  Irregular abnormal flow void within the left V4 segment, consistent with slow flow and/or occlusion (series 10, image 2). Left PCA is likely occluded. Major intracranial vascular flow voids are otherwise maintained. Skull and upper cervical spine: Craniocervical junction within normal limits. Bone marrow signal intensity normal. No scalp soft tissue abnormality. Sinuses/Orbits: Globes and orbital soft tissues demonstrate no acute finding. Paranasal sinuses are largely clear. No significant mastoid effusion. Inner ear structures grossly normal. Other: None. MRA HEAD FINDINGS ANTERIOR CIRCULATION: Visualized distal cervical segments of both internal carotid arteries are patent with antegrade flow. Petrous, cavernous, and supraclinoid segments patent without flow-limiting stenosis. 2 mm focal outpouching extending laterally and slightly posteriorly from the cavernous left ICA likely reflects a small vascular infundibulum, seen on prior arteriogram (series 1, image 95). A1 segments patent bilaterally. Normal anterior communicating artery complex. Both ACAs patent to their distal aspects without stenosis. Short-segment severe mid left M1 stenosis (series 1031, image 11). Mild to moderate stenosis at the distal right M1 segment (series 1037, image 10). Normal MCA bifurcations. MCA branches well perfused although demonstrate diffuse small vessel atheromatous irregularity. POSTERIOR CIRCULATION: Right V4 segment mildly irregular but patent without significant stenosis by MRA. Severe stenosis involving the intracranial left V4 segment with markedly attenuated flow. There remains some persistent antegrade flow to the vertebrobasilar junction. Both PICA origins remain patent and well perfused. 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 Basilar widely patent distally. Superior cerebellar arteries patent bilaterally. Right PCA supplied via the basilar as well as a small right posterior  communicating artery. Severe stenosis at the distal right P2/P3 junction (series 1043, image 16). Right PCA patent distally. There is occlusion of the left PCA at the proximal left P2 segment. A small left PCOM remains patent. MRA NECK FINDINGS AORTIC ARCH: Visualized aortic arch normal in caliber. Bovine branching pattern noted. No stenosis about the origin of the great vessels. RIGHT CAROTID SYSTEM: Right CCA patent to the bifurcation. No significant atheromatous irregularity or stenosis about the right carotid bulb. Right ICA patent distally without stenosis, evidence for dissection or occlusion. LEFT CAROTID SYSTEM: Left common carotid artery patent from its origin to the region of the left carotid bulb. 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 Area of signal loss measures 3.5 cm in craniocaudad dimension. Evaluation for potential intra stent stenosis limited by MRA, although robust flow seen within the ICA distal to the stent. No other stenosis, evidence for dissection or occlusion. VERTEBRAL ARTERIES: Both vertebral arteries arise from the subclavian arteries. Focal tortuosity with short-segment of approximately 50% stenosis at the proximal right subclavian artery, proximal to the takeoff of the right vertebral artery (series 1009, image 11), similar to prior CTA. Relatively mild 30% atheromatous stenosis at the origins of the vertebral arteries not significantly changed. Vertebral arteries otherwise patent within the neck without stenosis, evidence for dissection or occlusion. IMPRESSION: MRI HEAD IMPRESSION: 1. Acute to early subacute ischemic infarct involving the left temporoccipital region, corresponding with abnormality seen on most recent  CT. Mild patchy involvement at the left basal ganglia. No frank hemorrhagic transformation or significant regional mass effect. 2. Interval evolution of superimposed/underlying left PCA distribution  infarct, now late subacute to chronic in appearance. Associated susceptibility artifact consistent with petechial blood products and/or laminar necrosis. MRA HEAD IMPRESSION: 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 2. 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 3. Short-segment severe mid left M1 stenosis, with short-segment mild to moderate distal right M1 stenosis. 4. Distal small vessel atheromatous irregularity throughout the intracranial circulation. MRA NECK IMPRESSION: 1. 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 Evaluation for potential intra stent stenosis limited by MRA, although robust flow seen within the ICA distal to the stent. 2. Wide patency of the right carotid artery system without stenosis. 3. Relatively mild 30% atheromatous stenosis at the origins of the vertebral arteries. 4. Short-segment 50% stenosis involving the proximal right subclavian artery, proximal to the takeoff of the right vertebral artery. Electronically Signed   By: Rise MuBenjamin  McClintock M.D.   On: 06/01/2020 04:49   MR BRAIN WO CONTRAST  Result Date: 06/01/2020 CLINICAL DATA:  Follow-up examination for acute stroke. EXAM: MRI HEAD WITHOUT CONTRAST MRA HEAD WITHOUT CONTRAST MRA NECK WITHOUT AND WITH CONTRAST TECHNIQUE: Multiplanar, multi-echo pulse sequences of the brain and surrounding structures were acquired without intravenous contrast. Angiographic images of the Circle of Willis were acquired using MRA technique without intravenous contrast. Angiographic images of the neck were acquired using MRA technique without and with intravenous contrast. Carotid stenosis measurements (when applicable) are obtained utilizing NASCET criteria, using the distal internal carotid diameter as the denominator. CONTRAST:  8.595mL GADAVIST GADOBUTROL 1 MMOL/ML  IV SOLN COMPARISON:  Prior studies from 05/31/2020 as well as earlier. FINDINGS: MRI HEAD FINDINGS Brain: Generalized age-related cerebral atrophy. There has been interval evolution of previously identified left PCA distribution infarct, now late subacute to chronic in appearance. There are superimposed scattered areas of more acute appearing diffusion abnormality involving the left temporal occipital region, corresponding with abnormality on most recent CT. Associated hyperintense T2/FLAIR signal corresponds with the areas of diffusion abnormality, suggesting acute to early subacute ischemic change. Minimal patchy involvement of the left basal ganglia noted. Few scattered serpiginous foci of susceptibility artifact and T1 hyperintensity seen, consistent with mild petechial blood products and/or laminar necrosis. The preponderance of this finding is associated with the more chronic left PCA distribution infarct. No frank hemorrhagic transformation or significant regional mass effect. No other areas of acute or subacute infarction elsewhere within the brain. Gray-white matter differentiation otherwise maintained. No mass lesion or significant mass effect. No midline shift or hydrocephalus. No extra-axial fluid collection. Pituitary gland suprasellar region normal. Midline structures intact. Vascular: Irregular abnormal flow void within the left V4 segment, consistent with slow flow and/or occlusion (series 10, image 2). Left PCA is likely occluded. Major intracranial vascular flow voids are otherwise maintained. Skull and upper cervical spine: Craniocervical junction within normal limits. Bone marrow signal intensity normal. No scalp soft tissue abnormality. Sinuses/Orbits: Globes and orbital soft tissues demonstrate no acute finding. Paranasal sinuses are largely clear. No significant mastoid effusion. Inner ear structures grossly normal. Other: None. MRA HEAD FINDINGS ANTERIOR CIRCULATION: Visualized distal  cervical segments of both internal carotid arteries are patent with antegrade flow. Petrous, cavernous, and supraclinoid segments patent without flow-limiting stenosis. 2 mm focal  outpouching extending laterally and slightly posteriorly from the cavernous left ICA likely reflects a small vascular infundibulum, seen on prior arteriogram (series 1, image 95). A1 segments patent bilaterally. Normal anterior communicating artery complex. Both ACAs patent to their distal aspects without stenosis. Short-segment severe mid left M1 stenosis (series 1031, image 11). Mild to moderate stenosis at the distal right M1 segment (series 1037, image 10). Normal MCA bifurcations. MCA branches well perfused although demonstrate diffuse small vessel atheromatous irregularity. POSTERIOR CIRCULATION: Right V4 segment mildly irregular but patent without significant stenosis by MRA. Severe stenosis involving the intracranial left V4 segment with markedly attenuated flow. There remains some persistent antegrade flow to the vertebrobasilar junction. Both PICA origins remain patent and well perfused. 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 Basilar widely patent distally. Superior cerebellar arteries patent bilaterally. Right PCA supplied via the basilar as well as a small right posterior communicating artery. Severe stenosis at the distal right P2/P3 junction (series 1043, image 16). Right PCA patent distally. There is occlusion of the left PCA at the proximal left P2 segment. A small left PCOM remains patent. MRA NECK FINDINGS AORTIC ARCH: Visualized aortic arch normal in caliber. Bovine branching pattern noted. No stenosis about the origin of the great vessels. RIGHT CAROTID SYSTEM: Right CCA patent to the bifurcation. No significant atheromatous irregularity or stenosis about the right carotid bulb. Right ICA patent distally without stenosis, evidence for dissection or occlusion. LEFT CAROTID  SYSTEM: Left common carotid artery patent from its origin to the region of the left carotid bulb. 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 Area of signal loss measures 3.5 cm in craniocaudad dimension. Evaluation for potential intra stent stenosis limited by MRA, although robust flow seen within the ICA distal to the stent. No other stenosis, evidence for dissection or occlusion. VERTEBRAL ARTERIES: Both vertebral arteries arise from the subclavian arteries. Focal tortuosity with short-segment of approximately 50% stenosis at the proximal right subclavian artery, proximal to the takeoff of the right vertebral artery (series 1009, image 11), similar to prior CTA. Relatively mild 30% atheromatous stenosis at the origins of the vertebral arteries not significantly changed. Vertebral arteries otherwise patent within the neck without stenosis, evidence for dissection or occlusion. IMPRESSION: MRI HEAD IMPRESSION: 1. 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 Mild patchy involvement at the left basal ganglia. No frank hemorrhagic transformation or significant regional mass effect. 2. Interval evolution of superimposed/underlying left PCA distribution infarct, now late subacute to chronic in appearance. Associated susceptibility artifact consistent with petechial blood products and/or laminar necrosis. MRA HEAD IMPRESSION: 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 2. 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 3. Short-segment severe mid left M1 stenosis, with short-segment mild to moderate distal right M1 stenosis. 4. Distal small vessel atheromatous irregularity throughout the intracranial circulation. MRA NECK IMPRESSION: 1. Abrupt signal loss  spanning the left carotid bulb through the proximal left ICA related to a vascular stent. Evaluation for potential intra stent stenosis limited by MRA, although robust flow seen within the ICA distal to the stent. 2. Wide patency of the right carotid artery system without stenosis. 3. Relatively mild 30% atheromatous stenosis at the origins of the vertebral arteries. 4. Short-segment 50% stenosis involving the proximal  right subclavian artery, proximal to the takeoff of the right vertebral artery. Electronically Signed   By: Rise Mu M.D.   On: 06/01/2020 04:49   DG CHEST PORT 1 VIEW  Result Date: 05/31/2020 CLINICAL DATA:  Screening for metal prior to MRI. EXAM: PORTABLE CHEST 1 VIEW COMPARISON:  None. FINDINGS: Overlying monitoring devices in place. Lung volumes are low. No implanted medical device or radiopaque foreign body in the chest. Normal heart size. Aortic atherosclerosis and tortuosity. No pleural effusion or pneumothorax. No focal airspace disease. No acute osseous abnormalities are seen. IMPRESSION: 1. No implanted medical device or radiopaque foreign body in the chest to preclude MRI imaging. 2. Low lung volumes without acute abnormality. Electronically Signed   By: Narda Rutherford M.D.   On: 05/31/2020 23:38   DG Abd Portable 1V  Result Date: 05/31/2020 CLINICAL DATA:  Screening for metal prior to MRI. EXAM: PORTABLE ABDOMEN - 1 VIEW COMPARISON:  None. FINDINGS: Overlying monitoring devices in place. No implanted medical device or radiopaque foreign body to preclude MRI imaging. Normal bowel gas pattern. Advanced vascular calcifications. No radiopaque calculi. No acute osseous abnormalities are seen. IMPRESSION: No implanted medical device or radiopaque foreign body to preclude MRI imaging. Electronically Signed   By: Narda Rutherford M.D.   On: 05/31/2020 23:40       LOS: 1 day   Janessa Mickle  [facility] Pager on [email or web address]  06/01/2020, 9:39 AM

## 2020-06-01 NOTE — Progress Notes (Signed)
VASCULAR LAB    Carotid duplex has been performed.  See CV proc for preliminary results.   Brysin Towery, RVT 06/01/2020, 1:32 PM

## 2020-06-01 NOTE — Progress Notes (Addendum)
Reported blood pressure to C. Margo Aye, MD. Pt doesn't have any assessment changes. See vitals. No new orders

## 2020-06-02 LAB — IRON AND TIBC
Iron: 109 ug/dL (ref 45–182)
Saturation Ratios: 32 % (ref 17.9–39.5)
TIBC: 337 ug/dL (ref 250–450)
UIBC: 228 ug/dL

## 2020-06-02 LAB — CBC
HCT: 31 % — ABNORMAL LOW (ref 39.0–52.0)
Hemoglobin: 10.2 g/dL — ABNORMAL LOW (ref 13.0–17.0)
MCH: 32.3 pg (ref 26.0–34.0)
MCHC: 32.9 g/dL (ref 30.0–36.0)
MCV: 98.1 fL (ref 80.0–100.0)
Platelets: 284 10*3/uL (ref 150–400)
RBC: 3.16 MIL/uL — ABNORMAL LOW (ref 4.22–5.81)
RDW: 14.5 % (ref 11.5–15.5)
WBC: 6.3 10*3/uL (ref 4.0–10.5)
nRBC: 0 % (ref 0.0–0.2)

## 2020-06-02 LAB — VITAMIN B12: Vitamin B-12: 688 pg/mL (ref 180–914)

## 2020-06-02 LAB — PLATELET INHIBITION P2Y12: Platelet Function  P2Y12: 112 [PRU] — ABNORMAL LOW (ref 182–335)

## 2020-06-02 LAB — FERRITIN: Ferritin: 446 ng/mL — ABNORMAL HIGH (ref 24–336)

## 2020-06-02 LAB — BASIC METABOLIC PANEL
Anion gap: 5 (ref 5–15)
BUN: 20 mg/dL (ref 8–23)
CO2: 29 mmol/L (ref 22–32)
Calcium: 10 mg/dL (ref 8.9–10.3)
Chloride: 103 mmol/L (ref 98–111)
Creatinine, Ser: 1.09 mg/dL (ref 0.61–1.24)
GFR, Estimated: >60 mL/min (ref >60)
Glucose, Bld: 106 mg/dL — ABNORMAL HIGH (ref 70–99)
Potassium: 3.9 mmol/L (ref 3.5–5.1)
Sodium: 137 mmol/L (ref 135–145)

## 2020-06-02 LAB — RETICULOCYTES
Immature Retic Fract: 6.6 % (ref 2.3–15.9)
RBC.: 3.14 MIL/uL — ABNORMAL LOW (ref 4.22–5.81)
Retic Count, Absolute: 69.1 10*3/uL (ref 19.0–186.0)
Retic Ct Pct: 2.2 % (ref 0.4–3.1)

## 2020-06-02 LAB — FOLATE: Folate: 28.8 ng/mL (ref >5.9)

## 2020-06-02 NOTE — Progress Notes (Signed)
[facility] PROGRESS NOTE   BALRAJ BRAYFIELD ZOX:[id number] DOB: Feb 26, 1950 DOA: 05/31/2020  PCP: Roger Kill, PA-C  Brief History/Interval Summary: 70 y.o. male with history of prior PCA stroke and placement of ICA stent for left ICA stenosis on 5/2 by interventional radiology, was found by his friend today to be altered.  He was also noted to have decreased oral intake.  Patient has some expressive aphasia at baseline.  Patient was hospitalized for further management.  MRI suggested a new stroke.  Reason for Visit: Acute stroke  Consultants: Neurology  Procedures: None yet  Antibiotics: Anti-infectives (From admission, onward)   Start     Dose/Rate Route Frequency Ordered Stop   06/04/20 0000  ceFAZolin (ANCEF) IVPB 2g/100 mL premix        2 g 200 mL/hr over 30 Minutes Intravenous To Radiology 06/01/20 1116 06/05/20 0000      Subjective/Interval History: Patient responses to questions are not appropriate at all times.  Seems to be comfortable.  Does not appear to be in any discomfort or distress.     Assessment/Plan:  Acute stroke MRI shows an acute to early subacute ischemic infarct involving the left temporal occipital region.  Patient has had multiple strokes in the last few months.  He underwent left ICA stent placement on 5/2 which was done by interventional radiology.  Patient was supposed to be on aspirin and Plavix which is being continued here.   LDL 42.  HbA1c 5.9.  Patient noted to be on statin at home. PT OT SLP evaluation is ongoing. Further management per neurology.  It looks like plan is for a cerebral angiogram in the next few days.  Carotid artery stenosis Underwent stent placement to left ICA on 5/2, by interventional radiology.  See above.  Wernicke's aphasia Patient's inability to answer questions most likely is due to his significant aphasia.  There is also significant receptive aphasia as per speech therapy.  This could potentially  be a safety issue especially as patient apparently lives by himself.  As per SLP patient will need 24-hour supervision.  Hypotension in the setting of known history of hypertension His antihypertensives including HCTZ and lisinopril were held.  He was given fluid boluses and started on IV fluids.  Blood pressures have improved.  Cut back on IV fluids.    Acute kidney injury Came in with a creatinine of 1.5.  Baseline appears to be 0.87.  Baseline around 0.8.  Per history he has had poor oral intake.  Could have been dehydrated.  Started on IV fluids.  Renal function has improved and back to baseline.  Potassium was repleted.   Normocytic anemia Hemoglobin was 13.1 in April.  Over the last 2 weeks it has been in the 9-10 range.  No overt bleeding noted.   Anemia panel shows ferritin 446, iron 109, TIBC 337, folate 28.8, vitamin B12 688.   Further evaluation in the outpatient setting.  Diabetes mellitus type 2, diet controlled HbA1c 5.9.  Not noted to be on any glucose lowering agents at home.  No need to place him on sliding scale coverage at this time.  Concern for depression Daughter raised concern that patient may be depressed.  Apparently he has been talking about death to his friends and family.  Unable to assess suicidal ideation at this time.  May benefit from being seen by psychiatry especially since his family appears to be quite concerned about this.  We will request psychiatry consultation in the  next 24 to 48 hours waiting and allowing his mental status to perhaps improve some more.   DVT Prophylaxis: Lovenox Code Status: Full code Family Communication: Discussed with his daughter yesterday. Disposition Plan: From a physical function standpoint patient appears to not require intensive rehabilitation however patient does have significant receptive aphasia which creates a safety issues especially since patient lives by himself.  He will need 24 7 supervision.  Status is:  Inpatient  Remains inpatient appropriate because:Ongoing diagnostic testing needed not appropriate for outpatient work up, IV treatments appropriate due to intensity of illness or inability to take PO and Inpatient level of care appropriate due to severity of illness   Dispo: The patient is from: Home              Anticipated d/c is to: To be determined              Patient currently is not medically stable to d/c.   Difficult to place patient No      Medications:  Scheduled: . aspirin  81 mg Oral Daily  . atorvastatin  80 mg Oral Daily  . clopidogrel  75 mg Oral Daily  . enoxaparin (LOVENOX) injection  40 mg Subcutaneous Q24H   Continuous: . [START ON 06/04/2020]  ceFAZolin (ANCEF) IV     UEA:[id number] **OR** acetaminophen (TYLENOL) oral liquid 160 mg/5 mL **OR** acetaminophen   Objective:  Vital Signs  Vitals:   06/01/20 2006 06/01/20 2322 06/02/20 0354 06/02/20 0804  BP: 98/63 (!) 97/59 115/63 105/62  Pulse: 62 (!) 55 66 61  Resp: Temp: (!) 97.5 F (36.4 C) 98.2 F (36.8 C) 98.1 F (36.7 C) 98.3 F (36.8 C)  TempSrc: Oral Oral Oral Oral  SpO2: 100% 96% 99% 100%    Intake/Output Summary (Last 24 hours) at 06/02/2020 9562 Last data filed at 06/02/2020 1308 Gross per 24 hour  Intake 870 ml  Output --  Net 870 ml   There were no vitals filed for this visit.  General appearance: Awake alert.  In no distress.  Distracted Resp: Clear to auscultation bilaterally.  Normal effort Cardio: S1-S2 is normal regular.  No S3-S4.  No rubs murmurs or bruit GI: Abdomen is soft.  Nontender nondistended.  Bowel sounds are present normal.  No masses organomegaly Extremities: No edema.  Moving all of his extremities Neurologic: Aphasia noted.  No facial asymmetry.  Moving all of his extremities.    Lab Results:  Data Reviewed: I have personally reviewed following labs and imaging studies  CBC: Recent Labs  Lab 05/31/20 1411 06/01/20 0231 06/02/20 0425   WBC 8.5 6.5 6.3  NEUTROABS 6.7  --   --   HGB 10.8* 9.9* 10.2*  HCT 33.0* 30.3* 31.0*  MCV 99.1 97.4 98.1  PLT 364 371 284    Basic Metabolic Panel: Recent Labs  Lab 05/31/20 1411 06/01/20 0231 06/02/20 0425  NA 135 136 137  K 4.0 3.5 3.9  CL 99 102 103  CO2 GLUCOSE 111* 109* 106*  BUN 35* 31* 20  CREATININE 1.52* 1.19 1.09  CALCIUM 10.5* 9.6 10.0    GFR: Estimated Creatinine Clearance: 66 mL/min (by C-G formula based on SCr of 1.09 mg/dL).  Liver Function Tests: Recent Labs  Lab 05/31/20 1411  AST 26  ALT 26  ALKPHOS 62  BILITOT 2.0*  PROT 7.3  ALBUMIN 4.1     HbA1C: Recent Labs    06/01/20 0231  HGBA1C  5.9*    CBG: Recent Labs  Lab 05/31/20 1338  GLUCAP 107*    Lipid Profile: Recent Labs    06/01/20 0231  CHOL 92  HDL 42  LDLCALC 42  TRIG 40  CHOLHDL 2.2     Recent Results (from the past 240 hour(s))  Resp Panel by RT-PCR (Flu A&B, Covid)     Status: None   Collection Time: 05/31/20  3:34 PM  Result Value Ref Range Status   SARS Coronavirus 2 by RT PCR NEGATIVE NEGATIVE Final    Comment: (NOTE) SARS-CoV-2 target nucleic acids are NOT DETECTED.  The SARS-CoV-2 RNA is generally detectable in upper respiratory specimens during the acute phase of infection. The lowest concentration of SARS-CoV-2 viral copies this assay can detect is 138 copies/mL. A negative result does not preclude SARS-Cov-2 infection and should not be used as the sole basis for treatment or other patient management decisions. A negative result may occur with  improper specimen collection/handling, submission of specimen other than nasopharyngeal swab, presence of viral mutation(s) within the areas targeted by this assay, and inadequate number of viral copies(<138 copies/mL). A negative result must be combined with clinical observations, patient history, and epidemiological information. The expected result is Negative.  Fact Sheet for Patients:   BloggerCourse.com  Fact Sheet for Healthcare Providers:  SeriousBroker.it  This test is no t yet approved or cleared by the [place] FDA and  has been authorized for detection and/or diagnosis of SARS-CoV-2 by FDA under an Emergency Use Authorization (EUA). This EUA will remain  in effect (meaning this test can be used) for the duration of the COVID-19 declaration under Section 564(b)(1) of the Act, 21 U.S.C.section 360bbb-3(b)(1), unless the authorization is terminated  or revoked sooner.       Influenza A by PCR NEGATIVE NEGATIVE Final   Influenza B by PCR NEGATIVE NEGATIVE Final    Comment: (NOTE) The Xpert Xpress SARS-CoV-2/FLU/RSV plus assay is intended as an aid in the diagnosis of influenza from Nasopharyngeal swab specimens and should not be used as a sole basis for treatment. Nasal washings and aspirates are unacceptable for Xpert Xpress SARS-CoV-2/FLU/RSV testing.  Fact Sheet for Patients: BloggerCourse.com  Fact Sheet for Healthcare Providers: SeriousBroker.it  This test is not yet approved or cleared by the [place] FDA and has been authorized for detection and/or diagnosis of SARS-CoV-2 by FDA under an Emergency Use Authorization (EUA). This EUA will remain in effect (meaning this test can be used) for the duration of the COVID-19 declaration under Section 564(b)(1) of the Act, 21 U.S.C. section 360bbb-3(b)(1), unless the authorization is terminated or revoked.  Performed at [facility], [address] [place], [place] [address]       Radiology Studies: CT Head Wo Contrast  Result Date: 05/31/2020 CLINICAL DATA:  70 year old sent from [facility] for evaluation of confusion of unknown duration. EXAM: CT HEAD WITHOUT CONTRAST TECHNIQUE: Contiguous axial images were obtained from the base of the skull through the vertex  without intravenous contrast. COMPARISON:  CT head 03/15/2020.  MRI brain 03/16/2020. FINDINGS: Brain: Geographic low attenuation involving the LEFT temporal lobe, LEFT posterior parietal lobe and the entire LEFT occipital lobe (within the distribution of the LEFT posterior cerebral artery and a portion of the LEFT middle cerebral artery) with sparing of a few peripheral gyri. The patient had a small stroke in this distribution on the prior MRI in February, 2022, but today the distribution involves nearly the entire occipital  lobe and part of the posterior parietal lobe. There is a small focus of acute hemorrhage inferiorly and medially in the occipital lobe (series 2, image 14 and series 4, image 42). At this point, there is no significant mass effect or midline shift. Ventricular system normal in size and appearance for age. Stable mild age related cortical atrophy. Vascular: Moderate BILATERAL carotid siphon and LEFT vertebral artery atherosclerosis. Hyperdense LEFT posterior cerebral artery (2/13). Skull: No skull fracture or other focal osseous abnormality involving the skull. Sinuses/Orbits: Visualized paranasal sinuses, bilateral mastoid air cells and bilateral middle ear cavities well-aerated. Visualized orbits and globes normal in appearance. Other: None. IMPRESSION: 1. 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 2. Hyperdense LEFT posterior cerebral artery. 3. Very small associated acute hemorrhage in the inferomedial occipital lobe. 4. No significant mass effect or midline shift at this time. I telephoned these critical results at the time of interpretation on 05/31/2020 at 3:08 pm to provider [facility], who verbally acknowledged these results. Electronically Signed   By: Hulan Saas M.D.   On: 05/31/2020 15:13    MR ANGIO HEAD WO CONTRAST  Result Date: 06/01/2020 CLINICAL DATA:  Follow-up examination for acute stroke. EXAM: MRI HEAD WITHOUT CONTRAST MRA HEAD WITHOUT CONTRAST MRA NECK WITHOUT AND WITH CONTRAST TECHNIQUE: Multiplanar, multi-echo pulse sequences of the brain and surrounding structures were acquired without intravenous contrast. Angiographic images of the Circle of Willis were acquired using MRA technique without intravenous contrast. Angiographic images of the neck were acquired using MRA technique without and with intravenous contrast. Carotid stenosis measurements (when applicable) are obtained utilizing NASCET criteria, using the distal internal carotid diameter as the denominator. CONTRAST:  8.33mL GADAVIST GADOBUTROL 1 MMOL/ML IV SOLN COMPARISON:  Prior studies from 05/31/2020 as well as earlier. FINDINGS: MRI HEAD FINDINGS Brain: Generalized age-related cerebral atrophy. There has been interval evolution of previously identified left PCA distribution infarct, now late subacute to chronic in appearance. There are superimposed scattered areas of more acute appearing diffusion abnormality involving the left temporal occipital region, corresponding with abnormality on most recent CT. Associated hyperintense T2/FLAIR signal corresponds with the areas of diffusion abnormality, suggesting acute to early subacute ischemic change. Minimal patchy involvement of the left basal ganglia noted. Few scattered serpiginous foci of susceptibility artifact and T1 hyperintensity seen, consistent with mild petechial blood products and/or laminar necrosis. The preponderance of this finding is associated with the more chronic left PCA distribution infarct. No frank hemorrhagic transformation or significant regional mass effect. No other areas of acute or subacute infarction elsewhere within the brain. Gray-white matter differentiation otherwise maintained. No mass lesion or significant mass effect. No midline shift or  hydrocephalus. No extra-axial fluid collection. Pituitary gland suprasellar region normal. Midline structures intact. Vascular: Irregular abnormal flow void within the left V4 segment, consistent with slow flow and/or occlusion (series 10, image 2). Left PCA is likely occluded. Major intracranial vascular flow voids are otherwise maintained. Skull and upper cervical spine: Craniocervical junction within normal limits. Bone marrow signal intensity normal. No scalp soft tissue abnormality. Sinuses/Orbits: Globes and orbital soft tissues demonstrate no acute finding. Paranasal sinuses are largely clear. No significant mastoid effusion. Inner ear structures grossly normal. Other: None. MRA HEAD FINDINGS ANTERIOR CIRCULATION: Visualized distal cervical segments of both internal carotid arteries are patent with antegrade flow. Petrous, cavernous, and supraclinoid segments  patent without flow-limiting stenosis. 2 mm focal outpouching extending laterally and slightly posteriorly from the cavernous left ICA likely reflects a small vascular infundibulum, seen on prior arteriogram (series 1, image 95). A1 segments patent bilaterally. Normal anterior communicating artery complex. Both ACAs patent to their distal aspects without stenosis. Short-segment severe mid left M1 stenosis (series 1031, image 11). Mild to moderate stenosis at the distal right M1 segment (series 1037, image 10). Normal MCA bifurcations. MCA branches well perfused although demonstrate diffuse small vessel atheromatous irregularity. POSTERIOR CIRCULATION: Right V4 segment mildly irregular but patent without significant stenosis by MRA. Severe stenosis involving the intracranial left V4 segment with markedly attenuated flow. There remains some persistent antegrade flow to the vertebrobasilar junction. Both PICA origins remain patent and well perfused. 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 Basilar widely patent  distally. Superior cerebellar arteries patent bilaterally. Right PCA supplied via the basilar as well as a small right posterior communicating artery. Severe stenosis at the distal right P2/P3 junction (series 1043, image 16). Right PCA patent distally. There is occlusion of the left PCA at the proximal left P2 segment. A small left PCOM remains patent. MRA NECK FINDINGS AORTIC ARCH: Visualized aortic arch normal in caliber. Bovine branching pattern noted. No stenosis about the origin of the great vessels. RIGHT CAROTID SYSTEM: Right CCA patent to the bifurcation. No significant atheromatous irregularity or stenosis about the right carotid bulb. Right ICA patent distally without stenosis, evidence for dissection or occlusion. LEFT CAROTID SYSTEM: Left common carotid artery patent from its origin to the region of the left carotid bulb. 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 Area of signal loss measures 3.5 cm in craniocaudad dimension. Evaluation for potential intra stent stenosis limited by MRA, although robust flow seen within the ICA distal to the stent. No other stenosis, evidence for dissection or occlusion. VERTEBRAL ARTERIES: Both vertebral arteries arise from the subclavian arteries. Focal tortuosity with short-segment of approximately 50% stenosis at the proximal right subclavian artery, proximal to the takeoff of the right vertebral artery (series 1009, image 11), similar to prior CTA. Relatively mild 30% atheromatous stenosis at the origins of the vertebral arteries not significantly changed. Vertebral arteries otherwise patent within the neck without stenosis, evidence for dissection or occlusion. IMPRESSION: MRI HEAD IMPRESSION: 1. 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 Mild patchy involvement at the left basal ganglia. No frank  hemorrhagic transformation or significant regional mass effect. 2. Interval evolution of superimposed/underlying left PCA distribution infarct, now late subacute to chronic in appearance. Associated susceptibility artifact consistent with petechial blood products and/or laminar necrosis. MRA HEAD IMPRESSION: 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 2. 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 3. Short-segment severe mid left M1 stenosis, with short-segment mild to moderate distal right M1 stenosis. 4. Distal small vessel atheromatous irregularity throughout the intracranial circulation. MRA NECK IMPRESSION: 1. 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 Evaluation for potential intra stent stenosis limited by MRA, although robust flow seen within the ICA distal to the stent. 2. Wide patency of the right carotid artery system without stenosis. 3. Relatively mild 30% atheromatous stenosis at the origins of the vertebral arteries.  4. Short-segment 50% stenosis involving the proximal right subclavian artery, proximal to the takeoff of the right vertebral artery. Electronically Signed   By: Rise Mu M.D.   On: 06/01/2020 04:49   MR ANGIO NECK W WO CONTRAST  Result Date: 06/01/2020 CLINICAL DATA:  Follow-up examination for acute stroke. EXAM: MRI HEAD WITHOUT CONTRAST MRA HEAD WITHOUT CONTRAST MRA NECK WITHOUT AND WITH CONTRAST TECHNIQUE: Multiplanar, multi-echo pulse sequences of the brain and surrounding structures were acquired without intravenous contrast. Angiographic images of the Circle of Willis were acquired using MRA technique without intravenous contrast. Angiographic images of the neck were acquired using MRA technique without and with intravenous contrast. Carotid stenosis measurements (when applicable) are  obtained utilizing NASCET criteria, using the distal internal carotid diameter as the denominator. CONTRAST:  8.8mL GADAVIST GADOBUTROL 1 MMOL/ML IV SOLN COMPARISON:  Prior studies from 05/31/2020 as well as earlier. FINDINGS: MRI HEAD FINDINGS Brain: Generalized age-related cerebral atrophy. There has been interval evolution of previously identified left PCA distribution infarct, now late subacute to chronic in appearance. There are superimposed scattered areas of more acute appearing diffusion abnormality involving the left temporal occipital region, corresponding with abnormality on most recent CT. Associated hyperintense T2/FLAIR signal corresponds with the areas of diffusion abnormality, suggesting acute to early subacute ischemic change. Minimal patchy involvement of the left basal ganglia noted. Few scattered serpiginous foci of susceptibility artifact and T1 hyperintensity seen, consistent with mild petechial blood products and/or laminar necrosis. The preponderance of this finding is associated with the more chronic left PCA distribution infarct. No frank hemorrhagic transformation or significant regional mass effect. No other areas of acute or subacute infarction elsewhere within the brain. Gray-white matter differentiation otherwise maintained. No mass lesion or significant mass effect. No midline shift or hydrocephalus. No extra-axial fluid collection. Pituitary gland suprasellar region normal. Midline structures intact. Vascular: Irregular abnormal flow void within the left V4 segment, consistent with slow flow and/or occlusion (series 10, image 2). Left PCA is likely occluded. Major intracranial vascular flow voids are otherwise maintained. Skull and upper cervical spine: Craniocervical junction within normal limits. Bone marrow signal intensity normal. No scalp soft tissue abnormality. Sinuses/Orbits: Globes and orbital soft tissues demonstrate no acute finding. Paranasal sinuses are largely clear. No  significant mastoid effusion. Inner ear structures grossly normal. Other: None. MRA HEAD FINDINGS ANTERIOR CIRCULATION: Visualized distal cervical segments of both internal carotid arteries are patent with antegrade flow. Petrous, cavernous, and supraclinoid segments patent without flow-limiting stenosis. 2 mm focal outpouching extending laterally and slightly posteriorly from the cavernous left ICA likely reflects a small vascular infundibulum, seen on prior arteriogram (series 1, image 95). A1 segments patent bilaterally. Normal anterior communicating artery complex. Both ACAs patent to their distal aspects without stenosis. Short-segment severe mid left M1 stenosis (series 1031, image 11). Mild to moderate stenosis at the distal right M1 segment (series 1037, image 10). Normal MCA bifurcations. MCA branches well perfused although demonstrate diffuse small vessel atheromatous irregularity. POSTERIOR CIRCULATION: Right V4 segment mildly irregular but patent without significant stenosis by MRA. Severe stenosis involving the intracranial left V4 segment with markedly attenuated flow. There remains some persistent antegrade flow to the vertebrobasilar junction. Both PICA origins remain patent and well perfused. 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 Basilar widely patent distally. Superior cerebellar arteries patent bilaterally. Right PCA supplied via the basilar as well as a small right posterior communicating artery. Severe stenosis at the distal right P2/P3 junction (series 1043, image 16).  Right PCA patent distally. There is occlusion of the left PCA at the proximal left P2 segment. A small left PCOM remains patent. MRA NECK FINDINGS AORTIC ARCH: Visualized aortic arch normal in caliber. Bovine branching pattern noted. No stenosis about the origin of the great vessels. RIGHT CAROTID SYSTEM: Right CCA patent to the bifurcation. No significant atheromatous irregularity or  stenosis about the right carotid bulb. Right ICA patent distally without stenosis, evidence for dissection or occlusion. LEFT CAROTID SYSTEM: Left common carotid artery patent from its origin to the region of the left carotid bulb. 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 Area of signal loss measures 3.5 cm in craniocaudad dimension. Evaluation for potential intra stent stenosis limited by MRA, although robust flow seen within the ICA distal to the stent. No other stenosis, evidence for dissection or occlusion. VERTEBRAL ARTERIES: Both vertebral arteries arise from the subclavian arteries. Focal tortuosity with short-segment of approximately 50% stenosis at the proximal right subclavian artery, proximal to the takeoff of the right vertebral artery (series 1009, image 11), similar to prior CTA. Relatively mild 30% atheromatous stenosis at the origins of the vertebral arteries not significantly changed. Vertebral arteries otherwise patent within the neck without stenosis, evidence for dissection or occlusion. IMPRESSION: MRI HEAD IMPRESSION: 1. 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 Mild patchy involvement at the left basal ganglia. No frank hemorrhagic transformation or significant regional mass effect. 2. Interval evolution of superimposed/underlying left PCA distribution infarct, now late subacute to chronic in appearance. Associated susceptibility artifact consistent with petechial blood products and/or laminar necrosis. MRA HEAD IMPRESSION: 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 2. 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 3. Short-segment severe mid left M1 stenosis, with short-segment mild to moderate distal right M1 stenosis.  4. Distal small vessel atheromatous irregularity throughout the intracranial circulation. MRA NECK IMPRESSION: 1. 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 Evaluation for potential intra stent stenosis limited by MRA, although robust flow seen within the ICA distal to the stent. 2. Wide patency of the right carotid artery system without stenosis. 3. Relatively mild 30% atheromatous stenosis at the origins of the vertebral arteries. 4. Short-segment 50% stenosis involving the proximal right subclavian artery, proximal to the takeoff of the right vertebral artery. Electronically Signed   By: Rise Mu M.D.   On: 06/01/2020 04:49   MR BRAIN WO CONTRAST  Result Date: 06/01/2020 CLINICAL DATA:  Follow-up examination for acute stroke. EXAM: MRI HEAD WITHOUT CONTRAST MRA HEAD WITHOUT CONTRAST MRA NECK WITHOUT AND WITH CONTRAST TECHNIQUE: Multiplanar, multi-echo pulse sequences of the brain and surrounding structures were acquired without intravenous contrast. Angiographic images of the Circle of Willis were acquired using MRA technique without intravenous contrast. Angiographic images of the neck were acquired using MRA technique without and with intravenous contrast. Carotid stenosis measurements (when applicable) are obtained utilizing NASCET criteria, using the distal internal carotid diameter as the denominator. CONTRAST:  8.79mL GADAVIST GADOBUTROL 1 MMOL/ML IV SOLN COMPARISON:  Prior studies from 05/31/2020 as well as earlier. FINDINGS: MRI HEAD FINDINGS Brain: Generalized age-related cerebral atrophy. There has been interval evolution of previously identified left PCA distribution infarct, now late subacute to chronic in appearance. There  are superimposed scattered areas of more acute appearing diffusion abnormality involving the left temporal occipital region, corresponding with abnormality on most recent CT. Associated hyperintense  T2/FLAIR signal corresponds with the areas of diffusion abnormality, suggesting acute to early subacute ischemic change. Minimal patchy involvement of the left basal ganglia noted. Few scattered serpiginous foci of susceptibility artifact and T1 hyperintensity seen, consistent with mild petechial blood products and/or laminar necrosis. The preponderance of this finding is associated with the more chronic left PCA distribution infarct. No frank hemorrhagic transformation or significant regional mass effect. No other areas of acute or subacute infarction elsewhere within the brain. Gray-white matter differentiation otherwise maintained. No mass lesion or significant mass effect. No midline shift or hydrocephalus. No extra-axial fluid collection. Pituitary gland suprasellar region normal. Midline structures intact. Vascular: Irregular abnormal flow void within the left V4 segment, consistent with slow flow and/or occlusion (series 10, image 2). Left PCA is likely occluded. Major intracranial vascular flow voids are otherwise maintained. Skull and upper cervical spine: Craniocervical junction within normal limits. Bone marrow signal intensity normal. No scalp soft tissue abnormality. Sinuses/Orbits: Globes and orbital soft tissues demonstrate no acute finding. Paranasal sinuses are largely clear. No significant mastoid effusion. Inner ear structures grossly normal. Other: None. MRA HEAD FINDINGS ANTERIOR CIRCULATION: Visualized distal cervical segments of both internal carotid arteries are patent with antegrade flow. Petrous, cavernous, and supraclinoid segments patent without flow-limiting stenosis. 2 mm focal outpouching extending laterally and slightly posteriorly from the cavernous left ICA likely reflects a small vascular infundibulum, seen on prior arteriogram (series 1, image 95). A1 segments patent bilaterally. Normal anterior communicating artery complex. Both ACAs patent to their distal aspects without  stenosis. Short-segment severe mid left M1 stenosis (series 1031, image 11). Mild to moderate stenosis at the distal right M1 segment (series 1037, image 10). Normal MCA bifurcations. MCA branches well perfused although demonstrate diffuse small vessel atheromatous irregularity. POSTERIOR CIRCULATION: Right V4 segment mildly irregular but patent without significant stenosis by MRA. Severe stenosis involving the intracranial left V4 segment with markedly attenuated flow. There remains some persistent antegrade flow to the vertebrobasilar junction. Both PICA origins remain patent and well perfused. 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 Basilar widely patent distally. Superior cerebellar arteries patent bilaterally. Right PCA supplied via the basilar as well as a small right posterior communicating artery. Severe stenosis at the distal right P2/P3 junction (series 1043, image 16). Right PCA patent distally. There is occlusion of the left PCA at the proximal left P2 segment. A small left PCOM remains patent. MRA NECK FINDINGS AORTIC ARCH: Visualized aortic arch normal in caliber. Bovine branching pattern noted. No stenosis about the origin of the great vessels. RIGHT CAROTID SYSTEM: Right CCA patent to the bifurcation. No significant atheromatous irregularity or stenosis about the right carotid bulb. Right ICA patent distally without stenosis, evidence for dissection or occlusion. LEFT CAROTID SYSTEM: Left common carotid artery patent from its origin to the region of the left carotid bulb. 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 Area of signal loss measures 3.5 cm in craniocaudad dimension. Evaluation for potential intra stent stenosis limited by MRA, although robust flow seen within the ICA distal to the stent. No other stenosis, evidence for dissection or occlusion. VERTEBRAL ARTERIES: Both vertebral  arteries arise from the subclavian arteries. Focal tortuosity with short-segment of approximately 50% stenosis at the proximal right subclavian artery, proximal to the  takeoff of the right vertebral artery (series 1009, image 11), similar to prior CTA. Relatively mild 30% atheromatous stenosis at the origins of the vertebral arteries not significantly changed. Vertebral arteries otherwise patent within the neck without stenosis, evidence for dissection or occlusion. IMPRESSION: MRI HEAD IMPRESSION: 1. 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 Mild patchy involvement at the left basal ganglia. No frank hemorrhagic transformation or significant regional mass effect. 2. Interval evolution of superimposed/underlying left PCA distribution infarct, now late subacute to chronic in appearance. Associated susceptibility artifact consistent with petechial blood products and/or laminar necrosis. MRA HEAD IMPRESSION: 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 2. 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 3. Short-segment severe mid left M1 stenosis, with short-segment mild to moderate distal right M1 stenosis. 4. Distal small vessel atheromatous irregularity throughout the intracranial circulation. MRA NECK IMPRESSION: 1. 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 Evaluation for potential intra stent stenosis limited by MRA, although robust flow seen within the ICA distal to the stent. 2. Wide patency of the right carotid artery system without stenosis. 3. Relatively mild 30% atheromatous stenosis at the origins of the vertebral arteries. 4. Short-segment 50% stenosis involving the proximal right subclavian artery, proximal to the takeoff of the right  vertebral artery. Electronically Signed   By: Rise Mu M.D.   On: 06/01/2020 04:49   DG CHEST PORT 1 VIEW  Result Date: 05/31/2020 CLINICAL DATA:  Screening for metal prior to MRI. EXAM: PORTABLE CHEST 1 VIEW COMPARISON:  None. FINDINGS: Overlying monitoring devices in place. Lung volumes are low. No implanted medical device or radiopaque foreign body in the chest. Normal heart size. Aortic atherosclerosis and tortuosity. No pleural effusion or pneumothorax. No focal airspace disease. No acute osseous abnormalities are seen. IMPRESSION: 1. No implanted medical device or radiopaque foreign body in the chest to preclude MRI imaging. 2. Low lung volumes without acute abnormality. Electronically Signed   By: Narda Rutherford M.D.   On: 05/31/2020 23:38   DG Abd Portable 1V  Result Date: 05/31/2020 CLINICAL DATA:  Screening for metal prior to MRI. EXAM: PORTABLE ABDOMEN - 1 VIEW COMPARISON:  None. FINDINGS: Overlying monitoring devices in place. No implanted medical device or radiopaque foreign body to preclude MRI imaging. Normal bowel gas pattern. Advanced vascular calcifications. No radiopaque calculi. No acute osseous abnormalities are seen. IMPRESSION: No implanted medical device or radiopaque foreign body to preclude MRI imaging. Electronically Signed   By: Narda Rutherford M.D.   On: 05/31/2020 23:40   VAS US CAROTID  Result Date: 06/01/2020 Carotid Arterial Duplex Study Patient Name:  TREVAUGHN SCHEAR  Date of Exam:   06/01/2020 Medical Rec #: [id number]         Accession #:    [id number] Date of Birth: Apr 26, 1950        Patient Gender: M Patient Age:   69Y [facility] Procedure:      VAS US CAROTID Referring Phys: [id number] [facility] --------------------------------------------------------------------------------  Indications:       CVA (left PCA/MCA stroke) and confusion. MRA of neck shows                    normal right ICA and abrupt signal loss at  the left  ICA bulb                    through the proximal ICA. Risk Factors:      Hypertension, past history of smoking, prior MI, coronary                    artery disease. Other Factors:     Status post angio and left carotid stenting 10 days ago. Comparison Study:  No prior study Performing Technologist: Sherren Kernsandace Kanady RVS  Examination Guidelines: A complete evaluation includes B-mode imaging, spectral Doppler, color Doppler, and power Doppler as needed of all accessible portions of each vessel. Bilateral testing is considered an integral part of a complete examination. Limited examinations for reoccurring indications may be performed as noted.  Right Carotid Findings: +----------+--------+--------+--------+--------------------------+---------+           PSV cm/sEDV cm/sStenosisPlaque Description        Comments  +----------+--------+--------+--------+--------------------------+---------+ CCA Prox  108     20                                                  +----------+--------+--------+--------+--------------------------+---------+ CCA Distal121     22                                                  +----------+--------+--------+--------+--------------------------+---------+ ICA Prox  134     44      40-59%  heterogenous and irregularShadowing +----------+--------+--------+--------+--------------------------+---------+ ICA Mid   92      29                                                  +----------+--------+--------+--------+--------------------------+---------+ ICA Distal124     35                                                  +----------+--------+--------+--------+--------------------------+---------+ ECA       159     24                                                  +----------+--------+--------+--------+--------------------------+---------+ +----------+--------+-------+--------+-------------------+           PSV cm/sEDV cmsDescribeArm Pressure (mmHG)  +----------+--------+-------+--------+-------------------+ [id number]                                         +----------+--------+-------+--------+-------------------+ +---------+--------+--+--------+--+ VertebralPSV cm/s50EDV cm/s19 +---------+--------+--+--------+--+  Left Carotid Findings: +----------+--------+--------+--------+----------------------+--------+           PSV cm/sEDV cm/sStenosisPlaque Description    Comments +----------+--------+--------+--------+----------------------+--------+ CCA Prox  78      14              heterogenous                   +----------+--------+--------+--------+----------------------+--------+  CCA Distal75      18              heterogenous                   +----------+--------+--------+--------+----------------------+--------+ ICA Prox  68      18              calcific and irregularstent    +----------+--------+--------+--------+----------------------+--------+ ICA Mid   82      22                                             +----------+--------+--------+--------+----------------------+--------+ ICA Distal86      29                                             +----------+--------+--------+--------+----------------------+--------+ ECA       114     12                                             +----------+--------+--------+--------+----------------------+--------+ +----------+--------+--------+--------+-------------------+           PSV cm/sEDV cm/sDescribeArm Pressure (mmHG) +----------+--------+--------+--------+-------------------+ Subclavian127                                         +----------+--------+--------+--------+-------------------+ +---------+--------+--+--------+-+ VertebralPSV cm/s30EDV cm/s5 +---------+--------+--+--------+-+  Left Stent(s): +---------------+--+--+-------------++---------------+ Prox to Stent  9315<50% stenosis                 +---------------+--+--+-------------++---------------+ Proximal Stent 6818<50% stenosis                +---------------+--+--+-------------++---------------+ Mid Stent      8222<50% stenosisShadowing noted +---------------+--+--+-------------++---------------+ Distal Stent   8527<50% stenosis                +---------------+--+--+-------------++---------------+ Distal to Stent8629<50% stenosis                +---------------+--+--+-------------++---------------+ Shadowing noted proximal to mid stent. Normal waveforms noted pre and post shadowing.   Summary: Right Carotid: Velocities in the right ICA are consistent with a 40-59%                stenosis. Left Carotid: Velocities in the left ICA are consistent with a 1-39% stenosis.               <50% stenosis noted in left carotid, post stenting. Shadowing               noted proximal to mid stent. Normal waveforms noted pre and post               shadowing. Vertebrals:  Bilateral vertebral arteries demonstrate antegrade flow. Subclavians: Normal flow hemodynamics were seen in bilateral subclavian              arteries. *See table(s) above for measurements and observations.     Preliminary        LOS: 2 days   Daesean Lazarz Rito Ehrlich  [facility] Pager on [email or web address]  06/02/2020, 9:38 AM

## 2020-06-02 NOTE — Progress Notes (Addendum)
STROKE TEAM PROGRESS NOTE   INTERVAL HISTORY No one is at the bedside.  He is sitting in chair watching television. Pt with fluent aphasia. Pleasant.      Vitals:   06/01/20 2322 06/02/20 0354 06/02/20 0804 06/02/20 1134  BP: (!) 97/59 115/63 105/62 130/63  Pulse: (!) 55 66 61 60  Resp: 14 18    Temp: 98.2 F (36.8 C) 98.1 F (36.7 C) 98.3 F (36.8 C) 98.4 F (36.9 C)  TempSrc: Oral Oral Oral Oral  SpO2: 96% 99% 100% 100%   CBC:  Recent Labs  Lab 05/31/20 1411 06/01/20 0231 06/02/20 0425  WBC 8.5 6.5 6.3  NEUTROABS 6.7  --   --   HGB 10.8* 9.9* 10.2*  HCT 33.0* 30.3* 31.0*  MCV 99.1 97.4 98.1  PLT 364 371 284   Basic Metabolic Panel:  Recent Labs  Lab 06/01/20 0231 06/02/20 0425  NA 136 137  K 3.5 3.9  CL 102 103  CO2 28 29  GLUCOSE 109* 106*  BUN 31* 20  CREATININE 1.19 1.09  CALCIUM 9.6 10.0    Lipid Panel:  Recent Labs  Lab 06/01/20 0231  CHOL 92  TRIG 40  HDL 42  CHOLHDL 2.2  VLDL 8  LDLCALC 42    HgbA1c:  Recent Labs  Lab 06/01/20 0231  HGBA1C 5.9*   Urine Drug Screen:  Recent Labs  Lab 05/31/20 1458  LABOPIA NONE DETECTED  COCAINSCRNUR NONE DETECTED  LABBENZ NONE DETECTED  AMPHETMU NONE DETECTED  THCU NONE DETECTED  LABBARB NONE DETECTED    Alcohol Level No results for input(s): ETH in the last 168 hours.  IMAGING past 24 hours No results found.  PHYSICAL EXAM  General: pt seen sitting up in chair. Watching television. Nurse at bedside.  HENT: Normal oropharynx and mucosa. Normal external appearance of ears and nose but patient appears to be significantly hard of heaering.  Neck: Supple, no pain or tenderness  CV: No JVD. No peripheral edema.  Pulmonary: Symmetric Chest rise. Normal respiratory effort.  Abdomen: Soft to touch, non-tender.  Ext: No cyanosis, edema, or deformity  Skin: No rash. Normal palpation of skin.   Musculoskeletal: Normal digits and nails by inspection. No clubbing.   Neurologic Examination  Mental  status/Cognition: Alert, oriented to self, place,    Speech/language: He has some degree of fluent aphasia, oftentimes will get derailed in the middle of a statement.  He is able to answer some questions to some degree but  Cranial nerves:   CN II Pupils equal and reactive to light, he has a persistently dense right hemianopia   CN III,IV,VI EOM intact, no gaze preference or deviation, no nystagmus    CN V normal sensation in V1, V2, and V3 segments bilaterally    CN VII no asymmetry, no nasolabial fold flattening    CN VIII normal hearing to speech    CN IX & X normal palatal elevation, no uvular deviation    CN XI 5/5 head turn and 5/5 shoulder shrug bilaterally    CN XII midline tongue protrusion    Motor:  Normal tone, 5/5 throughout   Sensation:  Light touch    Pin prick    Temperature    Vibration   Proprioception    Coordination/Complex Motor:  - Finger to Nose normal bilaterally   ASSESSMENT/PLAN [name] is a 70 y.o. 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 He presents with increasing confusion for the last couple of days prior to admission  with decreased po intake   He was initially seen in [facility] and then immediately transferred to the ED where a CTH without contrast demonstrated a large left parietal occipital infarct.  MRI brain shows early subacute ischemic infarct at left temporoccipital region without hemorrhagic transformation, as well as evolution of left pca territory infarct. MRA head and neck shows chronically occluded lt PCA, as well severe stenosis at distal RIGHT P2 /3 junction, severe mid LEFT m1 stenosis, and moderate stenosis at RIGHT m1. MRA neck shows vascular stent but unable to characterize potential intra stent stenosis, patient right carotid artery system.       Stroke left temporoccipital region.   Code Stroke  CT head  05/31/20   very large left subacute stroke at posterior cerebral artery and left mca distributions.     Spoke with Dr. Corliss Skains regarding patient's admission. Plan for repeat angiogram on 06/03/20   MRI   Brain 06/01/2020  1. Acute to early subacute ischemic infarct involving the left  temporoccipital region, corresponding with abnormality seen on most  recent CT. Mild patchy involvement at the left basal ganglia. No  frank hemorrhagic transformation or significant regional mass  effect.  2. Interval evolution of superimposed/underlying left PCA  distribution infarct, now late subacute to chronic in appearance.  Associated susceptibility artifact consistent with petechial blood  products and/or laminar necrosis.    MRA HEAD IMPRESSION:06/01/20  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 2. 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 3. Short-segment severe mid left M1 stenosis, with short-segment mild to moderate distal right M1 stenosis. 4. Distal small vessel atheromatous irregularity throughout the intracranial circulation.   MRA NECK IMPRESSION: 06/01/20  1. 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 Evaluation for potential intra stent stenosis limited by MRA, although robust flow seen within the ICA distal to the stent. 2. Wide patency of the right carotid artery system without stenosis. 3. Relatively mild 30% atheromatous stenosis at the origins of the vertebral arteries. 4. Short-segment 50% stenosis involving the proximal right subclavian artery, proximal to the takeoff of the right vertebral artery.   2D Echo 02/2020   LVEF 55-60%. Bubble study negative  - no shunt  LDL 42  HgbA1c 5.9  VTE prophylaxis - lovenox     Diet   Diet Heart Room service appropriate? Yes; Fluid consistency: Thin   Diet NPO time specified Except for: Sips with Meds    aspirin 81 mg daily and clopidogrel 75 mg daily prior to admission, now on aspirin 81 mg daily and plavix 37.5mg .    Therapy recommendations:  pending  Disposition:  pending  Hypertension  Home meds:  Hctz, lisinopril  Stable . Permissive hypertension (OK if < 220/120) but gradually normalize in 5-7 days . Long-term BP goal normotensive  Hyperlipidemia  Home meds:  lipitor 80mg ,  resumed in hospital  LDL 42, goal < 70   Continue statin at discharge    HgbA1c 5.9, goal < 7.0  CBGs Recent Labs    05/31/20 1338  GLUCAP 107*      SSI  Other Stroke Risk Factors   Advanced Age >/=  29   Former Cigarette smoker    Hx stroke/TIA   P2Y12 5/14 was 135; pt received total dose 75mg  plavix. Repeat P2Y12 today is 112. Pt to receive 75mg  daily plavix.   Hospital day # 2  ATTENDING NOTE: I reviewed above note and agree with the assessment and plan. Pt was seen and examined.   Pt sitting in chair, family at the bedside. No acute event overnight. Neuro unchanged. P2Y12 = 112 today. Will continue plavix 75 daily. Pending P2Y12 repeat in am and also pending cerebral angiogram in am.   For detailed assessment and plan, please refer to above as I have made changes wherever appropriate.   , MD PhD Stroke Neurology 06/02/2020 6:15 PM     To contact Stroke Continuity provider, please refer to [facility] Plan. After hours, contact General Neurology

## 2020-06-03 NOTE — Progress Notes (Signed)
Physical Therapy Treatment Patient Details Name: [name] MRN: [id number] DOB: 17-Feb-1950 Today's Date: 06/03/2020    History of Present Illness 70 yo male presenting to ED after being found with AMS by friend. 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 PMH including PCA stroke and placement of ICA stent for left ICA stenosis on 5/2 by interventional radiology.    PT Comments    Pt was assisted to get up and walk on the hallway, and note his stability in walking was fairly good.  However, he is demonstratng issues with being able to balance both physically and following the directions.  He is unsafe prospectively at home due to inability to use information and follow safety instructions.  Will focus on his independent balance and safety with gait.  Pt is going to potentially need supervision permanently due to safety adherence.  Follow Up Recommendations  Supervision - Intermittent     Equipment Recommendations  None recommended by PT    Recommendations for Other Services       Precautions / Restrictions Precautions Precautions: Fall Precaution Comments: pt is strggling to sequence balance skills Restrictions Weight Bearing Restrictions: No Other Position/Activity Restrictions: reminders for upright posture    Mobility  Bed Mobility Overal bed mobility: Needs Assistance             General bed mobility comments: In recliner    Transfers Overall transfer level: Needs assistance Equipment used: None Transfers: Sit to/from Stand Sit to Stand: Supervision Stand pivot transfers: Supervision          Ambulation/Gait Ambulation/Gait assistance: Supervision Gait Distance (Feet): 200 Feet Assistive device: None Gait Pattern/deviations: Step-through pattern;Narrow base of support Gait velocity: reduced and variable Gait velocity interpretation: <1.31 ft/sec, indicative of household ambulator General Gait  Details: pt cannot demonstrate balance skills with narrow base standing, tandem standing and SLS.   Stairs             Wheelchair Mobility    Modified Rankin (Stroke Patients Only)       Balance Overall balance assessment: Needs assistance (pt is unable to balance with highest level berg balance skills) Sitting-balance support: Feet supported Sitting balance-Leahy Scale: Good     Standing balance support: No upper extremity supported Standing balance-Leahy Scale: Fair                              Cognition Arousal/Alertness: Awake/alert Behavior During Therapy: Impulsive Overall Cognitive Status: History of cognitive impairments - at baseline Area of Impairment: Problem solving;Awareness;Safety/judgement;Following commands;Memory;Attention;Orientation                 Orientation Level: Situation Current Attention Level: Selective Memory: Decreased recall of precautions;Decreased short-term memory Following Commands: Follows one step commands inconsistently;Follows one step commands with increased time Safety/Judgement: Decreased awareness of safety   Problem Solving: Requires verbal cues;Requires tactile cues General Comments: pt recalled his room location after walk on hallway      Exercises      General Comments General comments (skin integrity, edema, etc.): pt is unable to follow the conversation about home and following cues      Pertinent Vitals/Pain Pain Assessment: No/denies pain    Home Living                      Prior Function            PT Goals (current  goals can now be found in the care plan section) Acute Rehab PT Goals Patient Stated Goal: Eager to return home PT Goal Formulation: With patient Progress towards PT goals: Progressing toward goals    Frequency    Min 4X/week      PT Plan Current plan remains appropriate    Co-evaluation              AM-PAC PT "6 Clicks" Mobility   Outcome  Measure  Help needed turning from your back to your side while in a flat bed without using bedrails?: None Help needed moving from lying on your back to sitting on the side of a flat bed without using bedrails?: None Help needed moving to and from a bed to a chair (including a wheelchair)?: None Help needed standing up from a chair using your arms (e.g., wheelchair or bedside chair)?: None Help needed to walk in hospital room?: A Little   6 Click Score: 19    End of Session Equipment Utilized During Treatment: Gait belt Activity Tolerance: Patient tolerated treatment well Patient left: with call bell/phone within reach;in chair Nurse Communication: Mobility status PT Visit Diagnosis: Other symptoms and signs involving the nervous system (R29.898)     Time: 5400-8676 PT Time Calculation (min) (ACUTE ONLY): 19 min  Charges:  $Neuromuscular Re-education: 8-22 mins                 Ivar Drape 06/03/2020, 9:42 PM Samul Dada, PT MS Acute Rehab Dept. Number: [facility] [id number] and [facility] [phone]

## 2020-06-03 NOTE — Evaluation (Addendum)
Occupational Therapy Treatment Patient Details Name: [name] MRN: [id number] DOB: 1950-01-21 Today's Date: 06/03/2020    History of Present Illness 70 yo male presenting to ED after being found with AMS by friend. 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 PMH including PCA stroke and placement of ICA stent for left ICA stenosis on 5/2 by interventional radiology.   Clinical Impression   Pt progressing towards OT goals. Pt performing ADLs and functional mobility at Supervision level. Pt performing two part path finding task; requiring Max cues to locate first destination but then able to backtrack to his room. Pt presenting with slight impulsivity, decreased ST memory, and decreased attention. Would benefit form further acute OT to address cognition with IADLs (I.e. medication management, money management, cooking, etc). Continue to recommend dc to home with follow up at OP OT; may need SNF if unable to have family support.     Follow Up Recommendations  Outpatient OT;Supervision/Assistance - 24 hour (If unable to have 24/7, will need post-acute rehab (SNF))    Equipment Recommendations  None recommended by OT    Recommendations for Other Services       Precautions / Restrictions Precautions Precautions: Fall      Mobility Bed Mobility               General bed mobility comments: In recliner    Transfers Overall transfer level: Needs assistance Equipment used: None Transfers: Sit to/from Stand Sit to Stand: Supervision         General transfer comment: Supervision for safety    Balance Overall balance assessment: Mild deficits observed, not formally tested                                         ADL either performed or assessed with clinical judgement   ADL Overall ADL's : Needs assistance/impaired                         Toilet Transfer:  Supervision/safety;Ambulation Toilet Transfer Details (indicate cue type and reason): Supervision for safety         Functional mobility during ADLs: Supervision/safety General ADL Comments: Pt performing simpel path finding task with two steps "go to second set of elevators and turn around to return to room." Pt requiring Max cues for locating first destination and then was able to back track to his room     Vision         Perception     Praxis      Pertinent Vitals/Pain Pain Assessment: No/denies pain     Hand Dominance     Extremity/Trunk Assessment Upper Extremity Assessment Upper Extremity Assessment: Overall WFL for tasks assessed   Lower Extremity Assessment Lower Extremity Assessment: Defer to PT evaluation       Communication     Cognition Arousal/Alertness: Awake/alert Behavior During Therapy: Impulsive Overall Cognitive Status: History of cognitive impairments - at baseline Area of Impairment: Memory;Attention                   Current Attention Level: Selective Memory: Decreased short-term memory         General Comments: Pt performing path finding task with Max cues to locate second set of elevators and then turn around; as he was instructed. Pt was able to return to his room.  However, pt unable to verablize which room number was his and he ask reassuring questions throughout like "what next" or "tell me what you want". Pt slightly impulsive and with decreased ST memory and attention.   General Comments  Pt inconsistent when confirming home information. Attmepting to confirm that he is living with his daughter; however, pt reporting his house in being "redone" so he is at home but also at daughters. Reports he will go home at dc and handout with his buddies.    Exercises     Shoulder Instructions      Home Living                                          Prior Functioning/Environment                   OT Problem  List:        OT Treatment/Interventions:      OT Goals(Current goals can be found in the care plan section) Acute Rehab OT Goals Patient Stated Goal: Eager to return home OT Goal Formulation: With patient Time For Goal Achievement: 06/15/20 Potential to Achieve Goals: Good  OT Frequency: Min 2X/week   Barriers to D/C:            Co-evaluation              AM-PAC OT "6 Clicks" Daily Activity     Outcome Measure Help from another person eating meals?: None Help from another person taking care of personal grooming?: None Help from another person toileting, which includes using toliet, bedpan, or urinal?: A Little Help from another person bathing (including washing, rinsing, drying)?: A Little Help from another person to put on and taking off regular upper body clothing?: None Help from another person to put on and taking off regular lower body clothing?: A Little 6 Click Score: 21   End of Session Equipment Utilized During Treatment: Gait belt Nurse Communication: Mobility status  Activity Tolerance: Patient tolerated treatment well Patient left: in chair;with call bell/phone within reach  OT Visit Diagnosis: Other symptoms and signs involving cognitive function                Time: 0349-1791 OT Time Calculation (min): 14 min Charges:  OT General Charges $OT Visit: 1 Visit OT Treatments $Self Care/Home Management : 8-22 mins  [name] MSOT, OTR/L Acute Rehab Pager: [phone] Office: [phone]  Theodoro Grist [name] 06/03/2020, 9:33 AM

## 2020-06-03 NOTE — NC FL2 (Signed)
[place] MEDICAID FL2 LEVEL OF CARE SCREENING TOOL     IDENTIFICATION  Patient Name: [name] Birthdate: April 03, 1950 Sex: male Admission Date (Current Location): 05/31/2020  [facility] and [place] Number:  Producer, television/film/video and Address:  [facility] [place], [address] [place], [place] [address]      Provider Number: [id number]  Attending Physician Name and Address:  Osvaldo Shipper, MD  Relative Name and Phone Number:       Current Level of Care: Hospital Recommended Level of Care: Skilled Nursing Facility Prior Approval Number:    Date Approved/Denied:   PASRR Number: [id number]  Discharge Plan: SNF    Current Diagnoses: Patient Active Problem List   Diagnosis Date Noted  . Stroke (cerebrum) (HCC) 05/31/2020  . Internal carotid artery stenosis, left 05/20/2020  . Acute stroke due to occlusion of left posterior cerebral artery (HCC) 03/16/2020  . Essential hypertension 03/16/2020  . Polycythemia 03/16/2020  . Hypercalcemia 03/16/2020  . Controlled type 2 diabetes mellitus without complication, without long-term current use of insulin (HCC) 03/16/2020    Orientation RESPIRATION BLADDER Height & Weight     Self  Normal Continent Weight:   Height:     BEHAVIORAL SYMPTOMS/MOOD NEUROLOGICAL BOWEL NUTRITION STATUS      Continent Diet (heart healthy with thin liquids)  AMBULATORY STATUS COMMUNICATION OF NEEDS Skin   Supervision Verbally Normal                       Personal Care Assistance Level of Assistance  Bathing,Feeding,Dressing Bathing Assistance: Limited assistance Feeding assistance: Independent Dressing Assistance: Limited assistance     Functional Limitations Info  Sight,Hearing,Speech Sight Info: Impaired Hearing Info: Adequate Speech Info: Impaired (expressive aphasia)    SPECIAL CARE FACTORS FREQUENCY  Speech therapy,PT (By licensed PT),OT (By licensed OT)     PT Frequency: 5x/wk OT Frequency: 5x/wk      Speech Therapy Frequency: 5x/wk      Contractures Contractures Info: Not present    Additional Factors Info  Code Status,Allergies Code Status Info: Full Allergies Info: Ciprodex/ metformin           Current Medications (06/03/2020):  This is the current hospital active medication list Current Facility-Administered Medications  Medication Dose Route Frequency Provider Last Rate Last Admin  . acetaminophen (TYLENOL) tablet 650 mg  650 mg Oral Q4H PRN Pieter Partridge, MD       Or  . acetaminophen (TYLENOL) 160 MG/5ML solution 650 mg  650 mg Per Tube Q4H PRN Pieter Partridge, MD       Or  . acetaminophen (TYLENOL) suppository 650 mg  650 mg Rectal Q4H PRN Leandro Reasoner Tublu, MD      . aspirin chewable tablet 81 mg  81 mg Oral Daily Leandro Reasoner Tublu, MD   81 mg at 06/03/20 0846  . atorvastatin (LIPITOR) tablet 80 mg  80 mg Oral Daily Osvaldo Shipper, MD   80 mg at 06/03/20 0846  . [START ON 06/04/2020] ceFAZolin (ANCEF) IVPB 2g/100 mL premix  2 g Intravenous to XRAY Chalmers, Asher Muir R, NP      . clopidogrel (PLAVIX) tablet 75 mg  75 mg Oral Daily Marvel Plan, MD   75 mg at 06/03/20 0846  . enoxaparin (LOVENOX) injection 40 mg  40 mg Subcutaneous Q24H Leandro Reasoner Tublu, MD   40 mg at 06/02/20 2108     Discharge Medications: Please see discharge summary for a list of  discharge medications.  Relevant Imaging Results:  Relevant Lab Results:   Additional Information SS#: [id number]  Kermit Balo, RN

## 2020-06-03 NOTE — Care Management Important Message (Signed)
Important Message  Patient Details  Name: [name] MRN: [id number] Date of Birth: [birth year]   Medicare Important Message Given:  Yes     Ocean Kearley Stefan Church 06/03/2020, 3:32 PM

## 2020-06-03 NOTE — BH Assessment (Signed)
Care Management - Follow Up [facility] Discharges   Per chart review, patient is psychiatrically cleared.  Patient remains in the ED.    TOC is now working with the patient.

## 2020-06-03 NOTE — Progress Notes (Signed)
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  Subjective:  Patient awake and alert sitting in chair watching TV. Demonstrates intermittent confusion and intermittently follows simple commands. Speech clear but has word finding/identifying difficultly. Moving all extremities.   Allergies: Ciprodex [ciprofloxacin-dexamethasone] and Metformin and related  Medications: Prior to Admission medications   Medication Sig Start Date End Date Taking? Authorizing Provider  acetaminophen (TYLENOL) 500 MG tablet Take 1,000 mg by mouth every 6 (six) hours as needed for moderate pain or headache.   Yes [provider]  aspirin 81 MG chewable tablet Chew 1 tablet (81 mg total) by mouth daily. 05/22/20  Yes Orianna Biskup M, PA-C  atorvastatin (LIPITOR) 80 MG tablet Take 1 tablet (80 mg total) by mouth daily. 03/17/20  Yes Azucena FallenLancaster, William C, MD  b complex vitamins capsule Take 1 capsule by mouth daily.   Yes [provider]  clopidogrel (PLAVIX) 75 MG tablet Take 0.5 tablets (37.5 mg total) by mouth daily. 05/21/20 08/19/20 Yes Baxter Gonzalez M, PA-C  hydrochlorothiazide (HYDRODIURIL) 25 MG tablet Take 1 tablet (25 mg total) by mouth daily. 03/18/20  Yes Pahwani, Daleen Boavi, MD  lisinopril (ZESTRIL) 40 MG tablet Take 40 mg by mouth daily.   Yes [provider]  potassium chloride SA (KLOR-CON) 20 MEQ tablet Take 1 tablet (20 mEq total) by mouth daily. 03/22/20  Yes Azucena FallenLancaster, William C, MD  traZODone (DESYREL) 50 MG tablet Take 50 mg by mouth at bedtime as needed for sleep. 04/28/20  Yes [provider]  VITAMIN D-VITAMIN K PO Take 1 capsule by mouth daily.   Yes [provider]     Vital Signs: BP 105/61   Pulse 65   Temp 97.9 F (36.6 C) (Oral)   Resp 18   SpO2  100%   Physical Exam Vitals and nursing note reviewed.  Constitutional:      General: He is not in acute distress. Pulmonary:     Effort: Pulmonary effort is normal. No respiratory distress.  Skin:    General: Skin is warm and dry.  Neurological:     Mental Status: He is alert.     Comments: Alert and awake. Intermittent confusion, intermittently follows simple commands. Speech clear but has word finding/identifying difficultly. PERRL bilaterally. Moving all extremities. Fine motor and coordination intact, however intermittently follows simple commands.     Imaging: CT Head Wo Contrast  Result Date: 05/31/2020 CLINICAL DATA:  70 year old [facility] evaluation of confusion of unknown duration. EXAM: CT HEAD WITHOUT CONTRAST TECHNIQUE: Contiguous axial images were obtained from the base of the skull through the vertex without intravenous contrast. COMPARISON:  CT head 03/15/2020.  MRI brain 03/16/2020. FINDINGS: Brain: Geographic low attenuation involving the LEFT temporal lobe, LEFT posterior parietal lobe and the entire LEFT occipital lobe (within the distribution of the LEFT posterior cerebral artery and a portion of the LEFT middle cerebral artery) with sparing of a few peripheral gyri. The patient had a small stroke in this distribution on the prior MRI in February, 2022, but today the distribution involves nearly the entire occipital lobe and part of the posterior parietal lobe. There is a small focus of acute hemorrhage inferiorly and medially in the occipital lobe (series 2, image 14 and series 4, image 42). At this point, there is no  significant mass effect or midline shift. Ventricular system normal in size and appearance for age. Stable mild age related cortical atrophy. Vascular: Moderate BILATERAL carotid siphon and LEFT vertebral artery atherosclerosis. Hyperdense LEFT posterior cerebral artery (2/13). Skull: No skull fracture or other focal  osseous abnormality involving the skull. Sinuses/Orbits: Visualized paranasal sinuses, bilateral mastoid air cells and bilateral middle ear cavities well-aerated. Visualized orbits and globes normal in appearance. Other: None. IMPRESSION: 1. 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 2. Hyperdense LEFT posterior cerebral artery. 3. Very small associated acute hemorrhage in the inferomedial occipital lobe. 4. No significant mass effect or midline shift at this time. I telephoned these critical results at the time of interpretation on 05/31/2020 at 3:08 pm to provider [facility] [place], who verbally acknowledged these results. Electronically Signed   By: Hulan Saas M.D.   On: 05/31/2020 15:13   MR ANGIO HEAD WO CONTRAST  Result Date: 06/01/2020 CLINICAL DATA:  Follow-up examination for acute stroke. EXAM: MRI HEAD WITHOUT CONTRAST MRA HEAD WITHOUT CONTRAST MRA NECK WITHOUT AND WITH CONTRAST TECHNIQUE: Multiplanar, multi-echo pulse sequences of the brain and surrounding structures were acquired without intravenous contrast. Angiographic images of the Circle of Willis were acquired using MRA technique without intravenous contrast. Angiographic images of the neck were acquired using MRA technique without and with intravenous contrast. Carotid stenosis measurements (when applicable) are obtained utilizing NASCET criteria, using the distal internal carotid diameter as the denominator. CONTRAST:  8.38mL GADAVIST GADOBUTROL 1 MMOL/ML IV SOLN COMPARISON:  Prior studies from 05/31/2020 as well as earlier. FINDINGS: MRI HEAD FINDINGS Brain: Generalized age-related cerebral atrophy. There has been interval evolution of previously identified left PCA distribution infarct, now late subacute to chronic in appearance. There are superimposed  scattered areas of more acute appearing diffusion abnormality involving the left temporal occipital region, corresponding with abnormality on most recent CT. Associated hyperintense T2/FLAIR signal corresponds with the areas of diffusion abnormality, suggesting acute to early subacute ischemic change. Minimal patchy involvement of the left basal ganglia noted. Few scattered serpiginous foci of susceptibility artifact and T1 hyperintensity seen, consistent with mild petechial blood products and/or laminar necrosis. The preponderance of this finding is associated with the more chronic left PCA distribution infarct. No frank hemorrhagic transformation or significant regional mass effect. No other areas of acute or subacute infarction elsewhere within the brain. Gray-white matter differentiation otherwise maintained. No mass lesion or significant mass effect. No midline shift or hydrocephalus. No extra-axial fluid collection. Pituitary gland suprasellar region normal. Midline structures intact. Vascular: Irregular abnormal flow void within the left V4 segment, consistent with slow flow and/or occlusion (series 10, image 2). Left PCA is likely occluded. Major intracranial vascular flow voids are otherwise maintained. Skull and upper cervical spine: Craniocervical junction within normal limits. Bone marrow signal intensity normal. No scalp soft tissue abnormality. Sinuses/Orbits: Globes and orbital soft tissues demonstrate no acute finding. Paranasal sinuses are largely clear. No significant mastoid effusion. Inner ear structures grossly normal. Other: None. MRA HEAD FINDINGS ANTERIOR CIRCULATION: Visualized distal cervical segments of both internal carotid arteries are patent with antegrade flow. Petrous, cavernous, and supraclinoid segments patent without flow-limiting stenosis. 2 mm focal outpouching extending laterally and slightly posteriorly from the cavernous left ICA likely reflects a small vascular infundibulum,  seen on prior arteriogram (series 1, image 95). A1 segments patent bilaterally. Normal  anterior communicating artery complex. Both ACAs patent to their distal aspects without stenosis. Short-segment severe mid left M1 stenosis (series 1031, image 11). Mild to moderate stenosis at the distal right M1 segment (series 1037, image 10). Normal MCA bifurcations. MCA branches well perfused although demonstrate diffuse small vessel atheromatous irregularity. POSTERIOR CIRCULATION: Right V4 segment mildly irregular but patent without significant stenosis by MRA. Severe stenosis involving the intracranial left V4 segment with markedly attenuated flow. There remains some persistent antegrade flow to the vertebrobasilar junction. Both PICA origins remain patent and well perfused. 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 Basilar widely patent distally. Superior cerebellar arteries patent bilaterally. Right PCA supplied via the basilar as well as a small right posterior communicating artery. Severe stenosis at the distal right P2/P3 junction (series 1043, image 16). Right PCA patent distally. There is occlusion of the left PCA at the proximal left P2 segment. A small left PCOM remains patent. MRA NECK FINDINGS AORTIC ARCH: Visualized aortic arch normal in caliber. Bovine branching pattern noted. No stenosis about the origin of the great vessels. RIGHT CAROTID SYSTEM: Right CCA patent to the bifurcation. No significant atheromatous irregularity or stenosis about the right carotid bulb. Right ICA patent distally without stenosis, evidence for dissection or occlusion. LEFT CAROTID SYSTEM: Left common carotid artery patent from its origin to the region of the left carotid bulb. 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 Area of signal loss measures 3.5 cm in craniocaudad dimension. Evaluation for potential intra stent stenosis  limited by MRA, although robust flow seen within the ICA distal to the stent. No other stenosis, evidence for dissection or occlusion. VERTEBRAL ARTERIES: Both vertebral arteries arise from the subclavian arteries. Focal tortuosity with short-segment of approximately 50% stenosis at the proximal right subclavian artery, proximal to the takeoff of the right vertebral artery (series 1009, image 11), similar to prior CTA. Relatively mild 30% atheromatous stenosis at the origins of the vertebral arteries not significantly changed. Vertebral arteries otherwise patent within the neck without stenosis, evidence for dissection or occlusion. IMPRESSION: MRI HEAD IMPRESSION: 1. 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 Mild patchy involvement at the left basal ganglia. No frank hemorrhagic transformation or significant regional mass effect. 2. Interval evolution of superimposed/underlying left PCA distribution infarct, now late subacute to chronic in appearance. Associated susceptibility artifact consistent with petechial blood products and/or laminar necrosis. MRA HEAD IMPRESSION: 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 2. 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 3. Short-segment severe mid left M1 stenosis, with short-segment mild to moderate distal right M1 stenosis. 4. Distal small vessel atheromatous irregularity throughout the intracranial circulation. MRA NECK IMPRESSION: 1. 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 Evaluation for potential intra stent stenosis limited by MRA, although robust flow seen within the ICA distal to the stent. 2. Wide patency of the right carotid artery system without stenosis. 3. Relatively mild 30%  atheromatous stenosis at the origins of the vertebral arteries. 4. Short-segment 50% stenosis involving the proximal right subclavian artery, proximal to the takeoff of the right vertebral artery. Electronically Signed   By: Rise Mu M.D.   On: 06/01/2020 04:49   MR ANGIO  NECK W WO CONTRAST  Result Date: 06/01/2020 CLINICAL DATA:  Follow-up examination for acute stroke. EXAM: MRI HEAD WITHOUT CONTRAST MRA HEAD WITHOUT CONTRAST MRA NECK WITHOUT AND WITH CONTRAST TECHNIQUE: Multiplanar, multi-echo pulse sequences of the brain and surrounding structures were acquired without intravenous contrast. Angiographic images of the Circle of Willis were acquired using MRA technique without intravenous contrast. Angiographic images of the neck were acquired using MRA technique without and with intravenous contrast. Carotid stenosis measurements (when applicable) are obtained utilizing NASCET criteria, using the distal internal carotid diameter as the denominator. CONTRAST:  8.77mL GADAVIST GADOBUTROL 1 MMOL/ML IV SOLN COMPARISON:  Prior studies from 05/31/2020 as well as earlier. FINDINGS: MRI HEAD FINDINGS Brain: Generalized age-related cerebral atrophy. There has been interval evolution of previously identified left PCA distribution infarct, now late subacute to chronic in appearance. There are superimposed scattered areas of more acute appearing diffusion abnormality involving the left temporal occipital region, corresponding with abnormality on most recent CT. Associated hyperintense T2/FLAIR signal corresponds with the areas of diffusion abnormality, suggesting acute to early subacute ischemic change. Minimal patchy involvement of the left basal ganglia noted. Few scattered serpiginous foci of susceptibility artifact and T1 hyperintensity seen, consistent with mild petechial blood products and/or laminar necrosis. The preponderance of this finding is associated with the more chronic left PCA distribution  infarct. No frank hemorrhagic transformation or significant regional mass effect. No other areas of acute or subacute infarction elsewhere within the brain. Gray-white matter differentiation otherwise maintained. No mass lesion or significant mass effect. No midline shift or hydrocephalus. No extra-axial fluid collection. Pituitary gland suprasellar region normal. Midline structures intact. Vascular: Irregular abnormal flow void within the left V4 segment, consistent with slow flow and/or occlusion (series 10, image 2). Left PCA is likely occluded. Major intracranial vascular flow voids are otherwise maintained. Skull and upper cervical spine: Craniocervical junction within normal limits. Bone marrow signal intensity normal. No scalp soft tissue abnormality. Sinuses/Orbits: Globes and orbital soft tissues demonstrate no acute finding. Paranasal sinuses are largely clear. No significant mastoid effusion. Inner ear structures grossly normal. Other: None. MRA HEAD FINDINGS ANTERIOR CIRCULATION: Visualized distal cervical segments of both internal carotid arteries are patent with antegrade flow. Petrous, cavernous, and supraclinoid segments patent without flow-limiting stenosis. 2 mm focal outpouching extending laterally and slightly posteriorly from the cavernous left ICA likely reflects a small vascular infundibulum, seen on prior arteriogram (series 1, image 95). A1 segments patent bilaterally. Normal anterior communicating artery complex. Both ACAs patent to their distal aspects without stenosis. Short-segment severe mid left M1 stenosis (series 1031, image 11). Mild to moderate stenosis at the distal right M1 segment (series 1037, image 10). Normal MCA bifurcations. MCA branches well perfused although demonstrate diffuse small vessel atheromatous irregularity. POSTERIOR CIRCULATION: Right V4 segment mildly irregular but patent without significant stenosis by MRA. Severe stenosis involving the intracranial left V4  segment with markedly attenuated flow. There remains some persistent antegrade flow to the vertebrobasilar junction. Both PICA origins remain patent and well perfused. 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 Basilar widely patent distally. Superior cerebellar arteries patent bilaterally. Right PCA supplied via the basilar as well as a small right posterior communicating artery. Severe stenosis at the distal right P2/P3 junction (series 1043, image 16). Right PCA patent distally. There is occlusion of the left PCA at the proximal left P2 segment. A small left PCOM remains patent. MRA NECK FINDINGS AORTIC ARCH: Visualized aortic arch normal in caliber. Bovine branching pattern  noted. No stenosis about the origin of the great vessels. RIGHT CAROTID SYSTEM: Right CCA patent to the bifurcation. No significant atheromatous irregularity or stenosis about the right carotid bulb. Right ICA patent distally without stenosis, evidence for dissection or occlusion. LEFT CAROTID SYSTEM: Left common carotid artery patent from its origin to the region of the left carotid bulb. 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 Area of signal loss measures 3.5 cm in craniocaudad dimension. Evaluation for potential intra stent stenosis limited by MRA, although robust flow seen within the ICA distal to the stent. No other stenosis, evidence for dissection or occlusion. VERTEBRAL ARTERIES: Both vertebral arteries arise from the subclavian arteries. Focal tortuosity with short-segment of approximately 50% stenosis at the proximal right subclavian artery, proximal to the takeoff of the right vertebral artery (series 1009, image 11), similar to prior CTA. Relatively mild 30% atheromatous stenosis at the origins of the vertebral arteries not significantly changed. Vertebral arteries otherwise patent within the neck without stenosis, evidence  for dissection or occlusion. IMPRESSION: MRI HEAD IMPRESSION: 1. 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 Mild patchy involvement at the left basal ganglia. No frank hemorrhagic transformation or significant regional mass effect. 2. Interval evolution of superimposed/underlying left PCA distribution infarct, now late subacute to chronic in appearance. Associated susceptibility artifact consistent with petechial blood products and/or laminar necrosis. MRA HEAD IMPRESSION: 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 2. 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 3. Short-segment severe mid left M1 stenosis, with short-segment mild to moderate distal right M1 stenosis. 4. Distal small vessel atheromatous irregularity throughout the intracranial circulation. MRA NECK IMPRESSION: 1. 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 Evaluation for potential intra stent stenosis limited by MRA, although robust flow seen within the ICA distal to the stent. 2. Wide patency of the right carotid artery system without stenosis. 3. Relatively mild 30% atheromatous stenosis at the origins of the vertebral arteries. 4. Short-segment 50% stenosis involving the proximal right subclavian artery, proximal to the takeoff of the right vertebral artery. Electronically Signed   By: Rise Mu M.D.   On: 06/01/2020 04:49   MR BRAIN WO CONTRAST  Result Date: 06/01/2020 CLINICAL DATA:  Follow-up examination for acute stroke. EXAM: MRI HEAD WITHOUT CONTRAST MRA HEAD WITHOUT CONTRAST MRA NECK WITHOUT AND WITH CONTRAST TECHNIQUE: Multiplanar, multi-echo pulse sequences of the brain and surrounding structures were acquired without intravenous contrast. Angiographic  images of the Circle of Willis were acquired using MRA technique without intravenous contrast. Angiographic images of the neck were acquired using MRA technique without and with intravenous contrast. Carotid stenosis measurements (when applicable) are obtained utilizing NASCET criteria, using the distal internal carotid diameter as the denominator. CONTRAST:  8.29mL GADAVIST GADOBUTROL 1 MMOL/ML IV SOLN COMPARISON:  Prior studies from 05/31/2020 as well as earlier. FINDINGS: MRI HEAD FINDINGS Brain: Generalized age-related cerebral atrophy. There has been interval evolution of previously identified left PCA distribution infarct, now late subacute to chronic in appearance. There are superimposed scattered areas of more acute appearing diffusion abnormality involving the left temporal occipital region, corresponding with abnormality on most recent CT. Associated hyperintense T2/FLAIR signal corresponds with the areas of diffusion abnormality, suggesting acute to  early subacute ischemic change. Minimal patchy involvement of the left basal ganglia noted. Few scattered serpiginous foci of susceptibility artifact and T1 hyperintensity seen, consistent with mild petechial blood products and/or laminar necrosis. The preponderance of this finding is associated with the more chronic left PCA distribution infarct. No frank hemorrhagic transformation or significant regional mass effect. No other areas of acute or subacute infarction elsewhere within the brain. Gray-white matter differentiation otherwise maintained. No mass lesion or significant mass effect. No midline shift or hydrocephalus. No extra-axial fluid collection. Pituitary gland suprasellar region normal. Midline structures intact. Vascular: Irregular abnormal flow void within the left V4 segment, consistent with slow flow and/or occlusion (series 10, image 2). Left PCA is likely occluded. Major intracranial vascular flow voids are otherwise maintained. Skull and  upper cervical spine: Craniocervical junction within normal limits. Bone marrow signal intensity normal. No scalp soft tissue abnormality. Sinuses/Orbits: Globes and orbital soft tissues demonstrate no acute finding. Paranasal sinuses are largely clear. No significant mastoid effusion. Inner ear structures grossly normal. Other: None. MRA HEAD FINDINGS ANTERIOR CIRCULATION: Visualized distal cervical segments of both internal carotid arteries are patent with antegrade flow. Petrous, cavernous, and supraclinoid segments patent without flow-limiting stenosis. 2 mm focal outpouching extending laterally and slightly posteriorly from the cavernous left ICA likely reflects a small vascular infundibulum, seen on prior arteriogram (series 1, image 95). A1 segments patent bilaterally. Normal anterior communicating artery complex. Both ACAs patent to their distal aspects without stenosis. Short-segment severe mid left M1 stenosis (series 1031, image 11). Mild to moderate stenosis at the distal right M1 segment (series 1037, image 10). Normal MCA bifurcations. MCA branches well perfused although demonstrate diffuse small vessel atheromatous irregularity. POSTERIOR CIRCULATION: Right V4 segment mildly irregular but patent without significant stenosis by MRA. Severe stenosis involving the intracranial left V4 segment with markedly attenuated flow. There remains some persistent antegrade flow to the vertebrobasilar junction. Both PICA origins remain patent and well perfused. 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 Basilar widely patent distally. Superior cerebellar arteries patent bilaterally. Right PCA supplied via the basilar as well as a small right posterior communicating artery. Severe stenosis at the distal right P2/P3 junction (series 1043, image 16). Right PCA patent distally. There is occlusion of the left PCA at the proximal left P2 segment. A small left PCOM remains patent.  MRA NECK FINDINGS AORTIC ARCH: Visualized aortic arch normal in caliber. Bovine branching pattern noted. No stenosis about the origin of the great vessels. RIGHT CAROTID SYSTEM: Right CCA patent to the bifurcation. No significant atheromatous irregularity or stenosis about the right carotid bulb. Right ICA patent distally without stenosis, evidence for dissection or occlusion. LEFT CAROTID SYSTEM: Left common carotid artery patent from its origin to the region of the left carotid bulb. 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 Area of signal loss measures 3.5 cm in craniocaudad dimension. Evaluation for potential intra stent stenosis limited by MRA, although robust flow seen within the ICA distal to the stent. No other stenosis, evidence for dissection or occlusion. VERTEBRAL ARTERIES: Both vertebral arteries arise from the subclavian arteries. Focal tortuosity with short-segment of approximately 50% stenosis at the proximal right subclavian artery, proximal to the takeoff of the right vertebral artery (series 1009, image 11), similar to prior CTA. Relatively mild 30% atheromatous stenosis at the origins of the vertebral arteries not significantly changed. Vertebral arteries otherwise patent within the neck without stenosis,  evidence for dissection or occlusion. IMPRESSION: MRI HEAD IMPRESSION: 1. 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 Mild patchy involvement at the left basal ganglia. No frank hemorrhagic transformation or significant regional mass effect. 2. Interval evolution of superimposed/underlying left PCA distribution infarct, now late subacute to chronic in appearance. Associated susceptibility artifact consistent with petechial blood products and/or laminar necrosis. MRA HEAD IMPRESSION: 1. 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 2. 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 3. Short-segment severe mid left M1 stenosis, with short-segment mild to moderate distal right M1 stenosis. 4. Distal small vessel atheromatous irregularity throughout the intracranial circulation. MRA NECK IMPRESSION: 1. 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 Evaluation for potential intra stent stenosis limited by MRA, although robust flow seen within the ICA distal to the stent. 2. Wide patency of the right carotid artery system without stenosis. 3. Relatively mild 30% atheromatous stenosis at the origins of the vertebral arteries. 4. Short-segment 50% stenosis involving the proximal right subclavian artery, proximal to the takeoff of the right vertebral artery. Electronically Signed   By: Rise Mu M.D.   On: 06/01/2020 04:49   DG CHEST PORT 1 VIEW  Result Date: 05/31/2020 CLINICAL DATA:  Screening for metal prior to MRI. EXAM: PORTABLE CHEST 1 VIEW COMPARISON:  None. FINDINGS: Overlying monitoring devices in place. Lung volumes are low. No implanted medical device or radiopaque foreign body in the chest. Normal heart size. Aortic atherosclerosis and tortuosity. No pleural effusion or pneumothorax. No focal airspace disease. No acute osseous abnormalities are seen. IMPRESSION: 1. No implanted medical device or radiopaque foreign body in the chest to preclude MRI imaging. 2. Low lung volumes without acute abnormality. Electronically Signed   By: Narda Rutherford M.D.   On: 05/31/2020 23:38   DG Abd Portable 1V  Result Date: 05/31/2020 CLINICAL DATA:  Screening for metal prior to MRI. EXAM: PORTABLE ABDOMEN - 1 VIEW COMPARISON:  None. FINDINGS: Overlying monitoring devices in place. No implanted medical device or radiopaque foreign body to preclude MRI imaging. Normal bowel gas  pattern. Advanced vascular calcifications. No radiopaque calculi. No acute osseous abnormalities are seen. IMPRESSION: No implanted medical device or radiopaque foreign body to preclude MRI imaging. Electronically Signed   By: Narda Rutherford M.D.   On: 05/31/2020 23:40   VAS US CAROTID  Result Date: 06/01/2020 Carotid Arterial Duplex Study Patient Name:  SCHYLAR WUEBKER  Date of Exam:   06/01/2020 Medical Rec #: [id number]         Accession #:    [id number] Date of Birth: 05/24/1950        Patient Gender: M Patient Age:   69Y Exam Location:  [facility] Procedure:      VAS US CAROTID Referring Phys: [id number] [facility] --------------------------------------------------------------------------------  Indications:       CVA (left PCA/MCA stroke) and confusion. MRA of neck shows                    normal right ICA and abrupt signal loss at the left ICA bulb                    through the proximal ICA. Risk Factors:      Hypertension, past history  of smoking, prior MI, coronary                    artery disease. Other Factors:     Status post angio and left carotid stenting 10 days ago. Comparison Study:  No prior study Performing Technologist: Sherren Kerns RVS  Examination Guidelines: A complete evaluation includes B-mode imaging, spectral Doppler, color Doppler, and power Doppler as needed of all accessible portions of each vessel. Bilateral testing is considered an integral part of a complete examination. Limited examinations for reoccurring indications may be performed as noted.  Right Carotid Findings: +----------+--------+--------+--------+--------------------------+---------+           PSV cm/sEDV cm/sStenosisPlaque Description        Comments  +----------+--------+--------+--------+--------------------------+---------+ CCA Prox  108     20                                                  +----------+--------+--------+--------+--------------------------+---------+ CCA  Distal121     22                                                  +----------+--------+--------+--------+--------------------------+---------+ ICA Prox  134     44      40-59%  heterogenous and irregularShadowing +----------+--------+--------+--------+--------------------------+---------+ ICA Mid   92      29                                                  +----------+--------+--------+--------+--------------------------+---------+ ICA Distal124     35                                                  +----------+--------+--------+--------+--------------------------+---------+ ECA       159     24                                                  +----------+--------+--------+--------+--------------------------+---------+ +----------+--------+-------+--------+-------------------+           PSV cm/sEDV cmsDescribeArm Pressure (mmHG) +----------+--------+-------+--------+-------------------+ [id number]                                         +----------+--------+-------+--------+-------------------+ +---------+--------+--+--------+--+ VertebralPSV cm/s50EDV cm/s19 +---------+--------+--+--------+--+  Left Carotid Findings: +----------+--------+--------+--------+----------------------+--------+           PSV cm/sEDV cm/sStenosisPlaque Description    Comments +----------+--------+--------+--------+----------------------+--------+ CCA Prox  78      14              heterogenous                   +----------+--------+--------+--------+----------------------+--------+ CCA Distal75      18              heterogenous                   +----------+--------+--------+--------+----------------------+--------+  ICA Prox  68      18              calcific and irregularstent    +----------+--------+--------+--------+----------------------+--------+ ICA Mid   82      22                                              +----------+--------+--------+--------+----------------------+--------+ ICA Distal86      29                                             +----------+--------+--------+--------+----------------------+--------+ ECA       114     12                                             +----------+--------+--------+--------+----------------------+--------+ +----------+--------+--------+--------+-------------------+           PSV cm/sEDV cm/sDescribeArm Pressure (mmHG) +----------+--------+--------+--------+-------------------+ Subclavian127                                         +----------+--------+--------+--------+-------------------+ +---------+--------+--+--------+-+ VertebralPSV cm/s30EDV cm/s5 +---------+--------+--+--------+-+  Left Stent(s): +---------------+--+--+-------------++---------------+ Prox to Stent  9315<50% stenosis                +---------------+--+--+-------------++---------------+ Proximal Stent 6818<50% stenosis                +---------------+--+--+-------------++---------------+ Mid Stent      8222<50% stenosisShadowing noted +---------------+--+--+-------------++---------------+ Distal Stent   8527<50% stenosis                +---------------+--+--+-------------++---------------+ Distal to Stent8629<50% stenosis                +---------------+--+--+-------------++---------------+ Shadowing noted proximal to mid stent. Normal waveforms noted pre and post shadowing.   Summary: Right Carotid: Velocities in the right ICA are consistent with a 40-59%                stenosis. Left Carotid: Velocities in the left ICA are consistent with a 1-39% stenosis.               <50% stenosis noted in left carotid, post stenting. Shadowing               noted proximal to mid stent. Normal waveforms noted pre and post               shadowing. Vertebrals:  Bilateral vertebral arteries demonstrate antegrade flow. Subclavians: Normal flow hemodynamics  were seen in bilateral subclavian              arteries. *See table(s) above for measurements and observations.     Preliminary     Labs:  CBC: Recent Labs    05/21/20 0601 05/31/20 1411 06/01/20 0231 06/02/20 0425  WBC 6.0 8.5 6.5 6.3  HGB 9.5* 10.8* 9.9* 10.2*  HCT 28.5* 33.0* 30.3* 31.0*  PLT 201 364 371 284    COAGS: Recent Labs    03/15/20 1902 05/02/20 0650 05/16/20 1413  INR 0.9 1.0 0.9  APTT 33  --   --  BMP: Recent Labs    05/21/20 0601 05/31/20 1411 06/01/20 0231 06/02/20 0425  NA 135 135 136 137  K 3.8 4.0 3.5 3.9  CL 102 99 102 103  CO2 26 28 28 29   GLUCOSE 127* 111* 109* 106*  BUN 11 35* 31* 20  CALCIUM 9.3 10.5* 9.6 10.0  CREATININE 0.87 1.52* 1.19 1.09  GFRNONAA >60 49* >60 >60    LIVER FUNCTION TESTS: Recent Labs    03/15/20 1902 05/31/20 1411  BILITOT 1.4* 2.0*  AST 24 26  ALT 25 26  ALKPHOS 68 62  PROT 8.8* 7.3  ALBUMIN 5.0 4.1    Assessment and Plan:  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 Patient with intermittent confusion and intermittently follows simple commands, speech clear but has word finding/identifying difficultly, moving all extremities. Plan for image-guided cerebral arteriogram with possible revascularization (angioplasty, stent placement) of left MCA stenosis in IR with Dr. Juventino Slovak tentatively for tomorrow 06/04/2020 at 1115 pending IR scheduling. Consent in chart. Patient will be NPO at midnight. Afebrile and WBCs WNL. Ok to proceed with Plavix/Aspirin per Dr. Corliss Skains; will hold Lovenox per IR protocol. P2Y12 112 yesterday, repeat ordered for tomorrow AM. INR pending. COVID negative 05/31/2020. Further plans per [facility]/neurology- appreciate and agree with management. NIR to follow.   Electronically Signed: Elwin Mocha, PA-C 06/03/2020, 11:43 AM   I spent a total of 15 Minutes at the the patient's bedside AND on the patient's  hospital floor or unit, greater than 50% of which was counseling/coordinating care for left MCA stenosis/revascularization.

## 2020-06-03 NOTE — Progress Notes (Signed)
[facility] PROGRESS NOTE   SZYMON FOILES OHY:[id number] DOB: November 25, 1950 DOA: 05/31/2020  PCP: Roger Kill, PA-C  Brief History/Interval Summary: 70 y.o. male with history of prior PCA stroke and placement of ICA stent for left ICA stenosis on 5/2 by interventional radiology, was found by his friend today to be altered.  He was also noted to have decreased oral intake.  Patient has some expressive aphasia at baseline.  Patient was hospitalized for further management.  MRI suggested a new stroke.  Reason for Visit: Acute stroke  Consultants: Neurology.  Interventional radiology  Procedures: Cerebral angiogram is planned for 5/17  Antibiotics: Anti-infectives (From admission, onward)   Start     Dose/Rate Route Frequency Ordered Stop   06/04/20 0000  ceFAZolin (ANCEF) IVPB 2g/100 mL premix        2 g 200 mL/hr over 30 Minutes Intravenous To Radiology 06/01/20 1116 06/05/20 0000      Subjective/Interval History: Patient noted to be sitting up in the chair today.  Denies any pain issues.  His responses to questions are not appropriate at all times.  This is due to his aphasia.   Assessment/Plan:  Acute stroke MRI showed an acute to early subacute ischemic infarct involving the left temporal occipital region.  Patient has had multiple strokes in the last few months.  He underwent left ICA stent placement on 5/2 which was done by interventional radiology.  Patient was supposed to be on aspirin and Plavix which is being continued here.   LDL 42.  HbA1c 5.9.  Patient noted to be on statin at home. PT OT SLP evaluation is ongoing.  From a physical functioning standpoint he seems to be doing well but due to the significant aphasia is thought to require 24/7 supervision. Neurology is following.  Plan is for a cerebral angiogram tomorrow.  Carotid artery stenosis Underwent stent placement to left ICA on 5/2, by interventional radiology.  See above.  Wernicke's  aphasia Patient's inability to answer questions most likely is due to his significant aphasia.  There is also significant receptive aphasia as per speech therapy.  This could potentially be a safety issue especially as patient apparently lives by himself.  As per SLP patient will need 24-hour supervision.  Hypotension in the setting of known history of hypertension His antihypertensives including HCTZ and lisinopril were held.  He was given fluid boluses and started on IV fluids.  Blood pressures have improved.  We will stop IV fluids. Hypotension thought to be due to hypovolemia.  Acute kidney injury Came in with a creatinine of 1.5.  Baseline appears to be 0.87.  Baseline around 0.8.  Per history he has had poor oral intake.  Could have been dehydrated.  Started on IV fluids.  Renal function has improved and back to baseline.    Normocytic anemia Hemoglobin was 13.1 in April.  Over the last 2 weeks it has been in the 9-10 range.  No overt bleeding noted.   Anemia panel shows ferritin 446, iron 109, TIBC 337, folate 28.8, vitamin B12 688.   Further evaluation in the outpatient setting.  Diabetes mellitus type 2, diet controlled HbA1c 5.9.  Not noted to be on any glucose lowering agents at home.  No need to place him on sliding scale coverage at this time.  Concern for depression Daughter raised concern that patient may be depressed.  Apparently he has been talking about death to his friends and family.  Unable to assess suicidal ideation  at this time.   May benefit from being seen by psychiatry especially since his family appears to be quite concerned about this.   We will request psychiatry consultation in the next 24 to 48 hours waiting and allowing his mental status to perhaps improve some more.   DVT Prophylaxis: Lovenox Code Status: Full code Family Communication: Daughter being updated periodically. Disposition Plan: From a physical function standpoint patient appears to not require  intensive rehabilitation however patient does have significant receptive aphasia which creates a safety issues especially since patient lives by himself.  He will need 24 7 supervision.  Status is: Inpatient  Remains inpatient appropriate because:Ongoing diagnostic testing needed not appropriate for outpatient work up, IV treatments appropriate due to intensity of illness or inability to take PO and Inpatient level of care appropriate due to severity of illness   Dispo: The patient is from: Home              Anticipated d/c is to: To be determined              Patient currently is not medically stable to d/c.   Difficult to place patient No      Medications:  Scheduled: . aspirin  81 mg Oral Daily  . atorvastatin  80 mg Oral Daily  . clopidogrel  75 mg Oral Daily  . enoxaparin (LOVENOX) injection  40 mg Subcutaneous Q24H   Continuous: . [START ON 06/04/2020]  ceFAZolin (ANCEF) IV     TGG:YIRSWNIOEVOJJ **OR** acetaminophen (TYLENOL) oral liquid 160 mg/5 mL **OR** acetaminophen   Objective:  Vital Signs  Vitals:   06/02/20 1527 06/02/20 1940 06/02/20 2335 06/03/20 0435  BP: 115/66 125/64 112/70 116/71  Pulse: 60 64 (!) 59 60  Resp:  16 18 16   Temp: 97.8 F (36.6 C) 98.5 F (36.9 C) 98.2 F (36.8 C) 98.3 F (36.8 C)  TempSrc: Oral Oral Oral Oral  SpO2: 100% 100% 100% 98%    Intake/Output Summary (Last 24 hours) at 06/03/2020 0912 Last data filed at 06/02/2020 1721 Gross per 24 hour  Intake 300 ml  Output --  Net 300 ml   There were no vitals filed for this visit.   General appearance: Awake alert.  In no distress.  Expressive aphasia noted Resp: Clear to auscultation bilaterally.  Normal effort Cardio: S1-S2 is normal regular.  No S3-S4.  No rubs murmurs or bruit GI: Abdomen is soft.  Nontender nondistended.  Bowel sounds are present normal.  No masses organomegaly Extremities: No edema.   Neurologic: No obvious deficits except for his aphasia.    Lab  Results:  Data Reviewed: I have personally reviewed following labs and imaging studies  CBC: Recent Labs  Lab 05/31/20 1411 06/01/20 0231 06/02/20 0425  WBC 8.5 6.5 6.3  NEUTROABS 6.7  --   --   HGB 10.8* 9.9* 10.2*  HCT 33.0* 30.3* 31.0*  MCV 99.1 97.4 98.1  PLT 364 371 284    Basic Metabolic Panel: Recent Labs  Lab 05/31/20 1411 06/01/20 0231 06/02/20 0425  NA 135 136 137  K 4.0 3.5 3.9  CL 99 102 103  CO2 28 28 29   GLUCOSE 111* 109* 106*  BUN 35* 31* 20  CREATININE 1.52* 1.19 1.09  CALCIUM 10.5* 9.6 10.0    GFR: CrCl cannot be calculated (Unknown ideal weight.).  Liver Function Tests: Recent Labs  Lab 05/31/20 1411  AST 26  ALT 26  ALKPHOS 62  BILITOT 2.0*  PROT 7.3  ALBUMIN 4.1     HbA1C: Recent Labs    06/01/20 0231  HGBA1C 5.9*    CBG: Recent Labs  Lab 05/31/20 1338  GLUCAP 107*    Lipid Profile: Recent Labs    06/01/20 0231  CHOL 92  HDL 42  LDLCALC 42  TRIG 40  CHOLHDL 2.2     Recent Results (from the past 240 hour(s))  Resp Panel by RT-PCR (Flu A&B, Covid)     Status: None   Collection Time: 05/31/20  3:34 PM  Result Value Ref Range Status   SARS Coronavirus 2 by RT PCR NEGATIVE NEGATIVE Final    Comment: (NOTE) SARS-CoV-2 target nucleic acids are NOT DETECTED.  The SARS-CoV-2 RNA is generally detectable in upper respiratory specimens during the acute phase of infection. The lowest concentration of SARS-CoV-2 viral copies this assay can detect is 138 copies/mL. A negative result does not preclude SARS-Cov-2 infection and should not be used as the sole basis for treatment or other patient management decisions. A negative result may occur with  improper specimen collection/handling, submission of specimen other than nasopharyngeal swab, presence of viral mutation(s) within the areas targeted by this assay, and inadequate number of viral copies(<138 copies/mL). A negative result must be combined with clinical  observations, patient history, and epidemiological information. The expected result is Negative.  Fact Sheet for Patients:  BloggerCourse.comhttps://www.fda.gov/media/152166/download  Fact Sheet for Healthcare Providers:  SeriousBroker.ithttps://www.fda.gov/media/152162/download  This test is no t yet approved or cleared by the [place] FDA and  has been authorized for detection and/or diagnosis of SARS-CoV-2 by FDA under an Emergency Use Authorization (EUA). This EUA will remain  in effect (meaning this test can be used) for the duration of the COVID-19 declaration under Section 564(b)(1) of the Act, 21 U.S.C.section 360bbb-3(b)(1), unless the authorization is terminated  or revoked sooner.       Influenza A by PCR NEGATIVE NEGATIVE Final   Influenza B by PCR NEGATIVE NEGATIVE Final    Comment: (NOTE) The Xpert Xpress SARS-CoV-2/FLU/RSV plus assay is intended as an aid in the diagnosis of influenza from Nasopharyngeal swab specimens and should not be used as a sole basis for treatment. Nasal washings and aspirates are unacceptable for Xpert Xpress SARS-CoV-2/FLU/RSV testing.  Fact Sheet for Patients: BloggerCourse.comhttps://www.fda.gov/media/152166/download  Fact Sheet for Healthcare Providers: SeriousBroker.ithttps://www.fda.gov/media/152162/download  This test is not yet approved or cleared by the [place] FDA and has been authorized for detection and/or diagnosis of SARS-CoV-2 by FDA under an Emergency Use Authorization (EUA). This EUA will remain in effect (meaning this test can be used) for the duration of the COVID-19 declaration under Section 564(b)(1) of the Act, 21 U.S.C. section 360bbb-3(b)(1), unless the authorization is terminated or revoked.  Performed at [facility], [address] [place], [place] [address]       Radiology Studies: VAS US CAROTID  Result Date: 06/01/2020 Carotid Arterial Duplex Study Patient Name:  Scotty CourtRANDALL D Geimer  Date of Exam:   06/01/2020 Medical Rec #: [id number]          Accession #:    [id number] Date of Birth: Mar 13, 1950        Patient Gender: M Patient Age:   69Y [facility] Procedure:      VAS US CAROTID Referring Phys: [id number] [facility] --------------------------------------------------------------------------------  Indications:       CVA (left PCA/MCA stroke) and confusion. MRA of neck shows  normal right ICA and abrupt signal loss at the left ICA bulb                    through the proximal ICA. Risk Factors:      Hypertension, past history of smoking, prior MI, coronary                    artery disease. Other Factors:     Status post angio and left carotid stenting 10 days ago. Comparison Study:  No prior study Performing Technologist: Sherren Kerns RVS  Examination Guidelines: A complete evaluation includes B-mode imaging, spectral Doppler, color Doppler, and power Doppler as needed of all accessible portions of each vessel. Bilateral testing is considered an integral part of a complete examination. Limited examinations for reoccurring indications may be performed as noted.  Right Carotid Findings: +----------+--------+--------+--------+--------------------------+---------+           PSV cm/sEDV cm/sStenosisPlaque Description        Comments  +----------+--------+--------+--------+--------------------------+---------+ CCA Prox  108     20                                                  +----------+--------+--------+--------+--------------------------+---------+ CCA Distal121     22                                                  +----------+--------+--------+--------+--------------------------+---------+ ICA Prox  134     44      40-59%  heterogenous and irregularShadowing +----------+--------+--------+--------+--------------------------+---------+ ICA Mid   92      29                                                   +----------+--------+--------+--------+--------------------------+---------+ ICA Distal124     35                                                  +----------+--------+--------+--------+--------------------------+---------+ ECA       159     24                                                  +----------+--------+--------+--------+--------------------------+---------+ +----------+--------+-------+--------+-------------------+           PSV cm/sEDV cmsDescribeArm Pressure (mmHG) +----------+--------+-------+--------+-------------------+ [id number]                                         +----------+--------+-------+--------+-------------------+ +---------+--------+--+--------+--+ VertebralPSV cm/s50EDV cm/s19 +---------+--------+--+--------+--+  Left Carotid Findings: +----------+--------+--------+--------+----------------------+--------+           PSV cm/sEDV cm/sStenosisPlaque Description    Comments +----------+--------+--------+--------+----------------------+--------+ CCA Prox  78      14  heterogenous                   +----------+--------+--------+--------+----------------------+--------+ CCA Distal75      18              heterogenous                   +----------+--------+--------+--------+----------------------+--------+ ICA Prox  68      18              calcific and irregularstent    +----------+--------+--------+--------+----------------------+--------+ ICA Mid   82      22                                             +----------+--------+--------+--------+----------------------+--------+ ICA Distal86      29                                             +----------+--------+--------+--------+----------------------+--------+ ECA       114     12                                             +----------+--------+--------+--------+----------------------+--------+  +----------+--------+--------+--------+-------------------+           PSV cm/sEDV cm/sDescribeArm Pressure (mmHG) +----------+--------+--------+--------+-------------------+ Subclavian127                                         +----------+--------+--------+--------+-------------------+ +---------+--------+--+--------+-+ VertebralPSV cm/s30EDV cm/s5 +---------+--------+--+--------+-+  Left Stent(s): +---------------+--+--+-------------++---------------+ Prox to Stent  9315<50% stenosis                +---------------+--+--+-------------++---------------+ Proximal Stent 6818<50% stenosis                +---------------+--+--+-------------++---------------+ Mid Stent      8222<50% stenosisShadowing noted +---------------+--+--+-------------++---------------+ Distal Stent   8527<50% stenosis                +---------------+--+--+-------------++---------------+ Distal to Stent8629<50% stenosis                +---------------+--+--+-------------++---------------+ Shadowing noted proximal to mid stent. Normal waveforms noted pre and post shadowing.   Summary: Right Carotid: Velocities in the right ICA are consistent with a 40-59%                stenosis. Left Carotid: Velocities in the left ICA are consistent with a 1-39% stenosis.               <50% stenosis noted in left carotid, post stenting. Shadowing               noted proximal to mid stent. Normal waveforms noted pre and post               shadowing. Vertebrals:  Bilateral vertebral arteries demonstrate antegrade flow. Subclavians: Normal flow hemodynamics were seen in bilateral subclavian              arteries. *See table(s) above for measurements and observations.     Preliminary        LOS: 3 days   Osvaldo Shipper  [facility] Web designer on [email or web address]  06/03/2020, 9:12 AM

## 2020-06-03 NOTE — TOC Initial Note (Addendum)
Transition of Care [facility]) - Initial/Assessment Note    Patient Details  Name: [name] MRN: [id number] Date of Birth: 06-17-1950  [facility] CM/SW Contact:    Kermit Balo, RN Phone Number: 06/03/2020, 11:09 AM  Clinical Narrative:                 Patient with recommendations for supervision at home but pt lives home alone. CM spoke to the patient and then his daughter over the phone. She is requesting SNF rehab to get her father in a better position to return home. Pt is in agreement.  Pt continues with cognitive deficits. ST:  He will need 24 hour supervision for safety. CM has completed FL2 and faxed him out in the [place] area per daughter request.  Once we have a facility selected will initiate insurance auth.  ToC following.  1445: CM has emailed bed offers for SNF to pts daughter per her request. CM also sent information on a Place for Mom so she can look at long term needs.  Expected Discharge Plan: Skilled Nursing Facility Barriers to Discharge: Continued Medical Work up   Patient Goals and CMS Choice   CMS Medicare.gov Compare Post Acute Care list provided to:: Patient Choice offered to / list presented to : Patient,Adult Children  Expected Discharge Plan and Services Expected Discharge Plan: Skilled Nursing Facility In-house Referral: Clinical Social Work Discharge Planning Services: CM Consult Post Acute Care Choice: Skilled Nursing Facility Living arrangements for the past 2 months: Single Family Home                                      Prior Living Arrangements/Services Living arrangements for the past 2 months: Single Family Home Lives with:: Self Patient language and need for interpreter reviewed:: Yes Do you feel safe going back to the place where you live?: Yes      Need for Family Participation in Patient Care: Yes (Comment) Care giver support system in place?: No (comment)   Criminal Activity/Legal Involvement  Pertinent to Current Situation/Hospitalization: Yes - Comment as needed  Activities of Daily Living Home Assistive Devices/Equipment: Eyeglasses ADL Screening (condition at time of admission) Patient's cognitive ability adequate to safely complete daily activities?: Yes Is the patient deaf or have difficulty hearing?: No Does the patient have difficulty seeing, even when wearing glasses/contacts?: Yes Does the patient have difficulty concentrating, remembering, or making decisions?: Yes Patient able to express need for assistance with ADLs?: Yes Does the patient have difficulty dressing or bathing?: No Independently performs ADLs?: Yes (appropriate for developmental age) Does the patient have difficulty walking or climbing stairs?: No Weakness of Legs: None Weakness of Arms/Hands: None  Permission Sought/Granted                  Emotional Assessment Appearance:: Appears stated age Attitude/Demeanor/Rapport: Engaged Affect (typically observed): Accepting Orientation: : Oriented to Self   Psych Involvement: No (comment)  Admission diagnosis:  Stroke (cerebrum) (HCC) [I63.9] Altered mental status, unspecified altered mental status type [R41.82] Cerebrovascular accident (CVA), unspecified mechanism (HCC) [I63.9] Patient Active Problem List   Diagnosis Date Noted  . Stroke (cerebrum) (HCC) 05/31/2020  . Internal carotid artery stenosis, left 05/20/2020  . Acute stroke due to occlusion of left posterior cerebral artery (HCC) 03/16/2020  . Essential hypertension 03/16/2020  . Polycythemia 03/16/2020  . Hypercalcemia 03/16/2020  . Controlled type 2  diabetes mellitus without complication, without long-term current use of insulin (HCC) 03/16/2020   PCP:  Roger Kill, PA-C Pharmacy:   [facility] [phone] - [place], [place] - [address] AT [address] OF [facility] [address] [facility] [address] [place] [address] Phone: [phone] Fax:  [phone]     Social Determinants of Health (SDOH) Interventions    Readmission Risk Interventions No flowsheet data found.

## 2020-06-04 ENCOUNTER — Encounter (HOSPITAL_COMMUNITY): Payer: Self-pay | Admitting: Internal Medicine

## 2020-06-04 ENCOUNTER — Encounter (HOSPITAL_COMMUNITY)
Admission: EM | Disposition: A | Discharge status: Rehabilitation Facility | Payer: Self-pay | Source: Home / Self Care | Attending: Internal Medicine

## 2020-06-04 ENCOUNTER — Inpatient Hospital Stay (HOSPITAL_COMMUNITY): Payer: Medicare HMO | Admitting: Certified Registered Nurse Anesthetist

## 2020-06-04 ENCOUNTER — Inpatient Hospital Stay (HOSPITAL_COMMUNITY): Payer: Medicare HMO

## 2020-06-04 DIAGNOSIS — Z8673 Personal history of transient ischemic attack (TIA), and cerebral infarction without residual deficits: Secondary | ICD-10-CM

## 2020-06-04 DIAGNOSIS — I6602 Occlusion and stenosis of left middle cerebral artery: Secondary | ICD-10-CM | POA: Diagnosis present

## 2020-06-04 HISTORY — PX: IR ANGIO INTRA EXTRACRAN SEL INTERNAL CAROTID UNI L MOD SED: IMG5361

## 2020-06-04 HISTORY — PX: RADIOLOGY WITH ANESTHESIA: SHX6223

## 2020-06-04 HISTORY — PX: IR US GUIDE VASC ACCESS RIGHT: IMG2390

## 2020-06-04 HISTORY — PX: IR INTRA CRAN STENT: IMG2345

## 2020-06-04 LAB — CBC WITH DIFFERENTIAL/PLATELET
Abs Immature Granulocytes: 0.02 10*3/uL (ref 0.00–0.07)
Basophils Absolute: 0 10*3/uL (ref 0.0–0.1)
Basophils Relative: 1 %
Eosinophils Absolute: 0.2 10*3/uL (ref 0.0–0.5)
Eosinophils Relative: 3 %
HCT: 28.2 % — ABNORMAL LOW (ref 39.0–52.0)
Hemoglobin: 9.5 g/dL — ABNORMAL LOW (ref 13.0–17.0)
Immature Granulocytes: 0 %
Lymphocytes Relative: 28 %
Lymphs Abs: 1.8 10*3/uL (ref 0.7–4.0)
MCH: 32.6 pg (ref 26.0–34.0)
MCHC: 33.7 g/dL (ref 30.0–36.0)
MCV: 96.9 fL (ref 80.0–100.0)
Monocytes Absolute: 0.4 10*3/uL (ref 0.1–1.0)
Monocytes Relative: 6 %
Neutro Abs: 4.1 10*3/uL (ref 1.7–7.7)
Neutrophils Relative %: 62 %
Platelets: 252 10*3/uL (ref 150–400)
RBC: 2.91 MIL/uL — ABNORMAL LOW (ref 4.22–5.81)
RDW: 14.2 % (ref 11.5–15.5)
WBC: 6.5 10*3/uL (ref 4.0–10.5)
nRBC: 0 % (ref 0.0–0.2)

## 2020-06-04 LAB — TYPE AND SCREEN
ABO/RH(D): A POS
Antibody Screen: NEGATIVE

## 2020-06-04 LAB — GLUCOSE, CAPILLARY: Glucose-Capillary: 117 mg/dL — ABNORMAL HIGH (ref 70–99)

## 2020-06-04 LAB — POCT ACTIVATED CLOTTING TIME
Activated Clotting Time: 178 seconds
Activated Clotting Time: 184 seconds

## 2020-06-04 LAB — PROTIME-INR
INR: 0.9 (ref 0.8–1.2)
Prothrombin Time: 12.5 seconds (ref 11.4–15.2)

## 2020-06-04 LAB — PLATELET INHIBITION P2Y12: Platelet Function  P2Y12: 101 [PRU] — ABNORMAL LOW (ref 182–335)

## 2020-06-04 LAB — HEPARIN LEVEL (UNFRACTIONATED): Heparin Unfractionated: 0.15 IU/mL — ABNORMAL LOW (ref 0.30–0.70)

## 2020-06-04 IMAGING — XA IR INTRACRANIAL STENT (INCL PTA)
1 of 2 series · 10 of 24 positions shown · non-contrast
Comparison: MRI MRA of the brain [DATE].

CLINICAL DATA: Symptomatic left middle cerebral artery M1 stenosis.

EXAM:
INTRACRANIAL STENT (INCL PTA)
TECHNIQUE: Informed written consent was obtained from the patient after a
thorough discussion of the procedural risks, benefits and
alternatives. All questions were addressed. Maximal Sterile Barrier
Technique was utilized including caps, mask, sterile gowns, sterile
gloves, sterile drape, hand hygiene and skin antiseptic. A timeout
was performed prior to the initiation of the procedure.

[Series 300: ir intra cran stent · 10 of 193 slices shown]
[im 1/193]
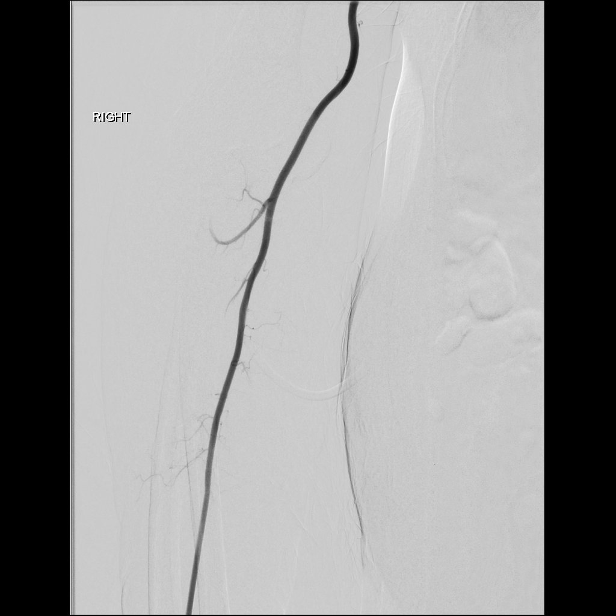
[im 18/193]
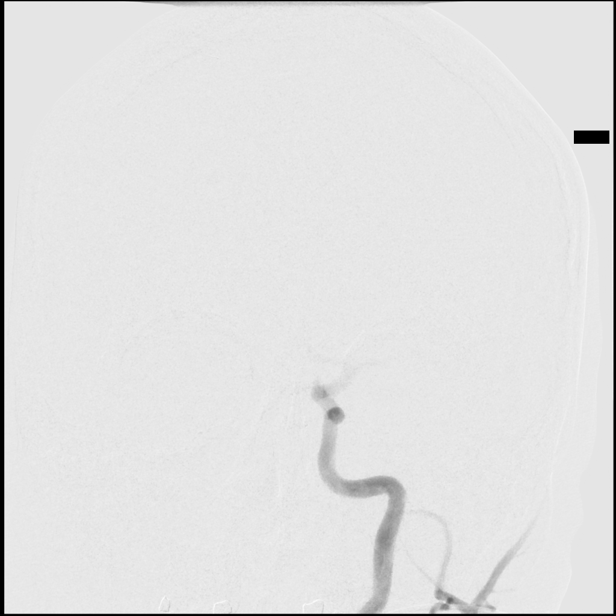
[im 44/193]
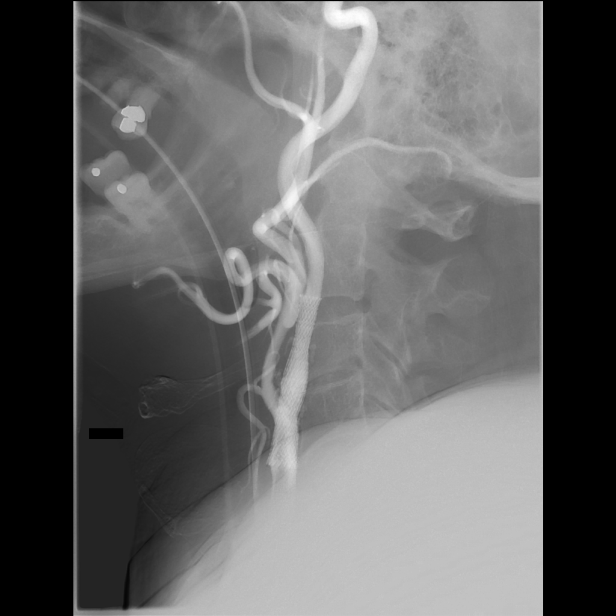
[im 62/193]
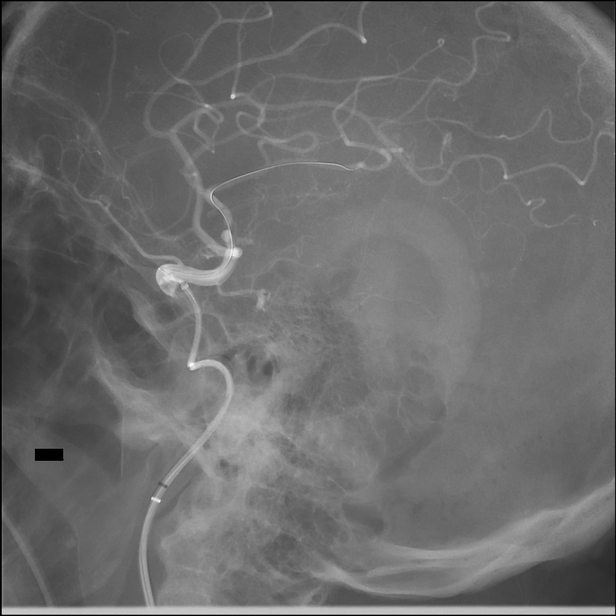
[im 79/193]
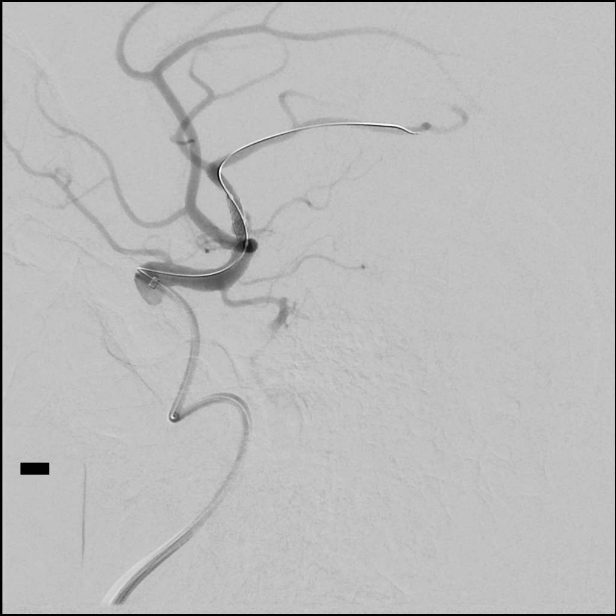
[im 105/193]
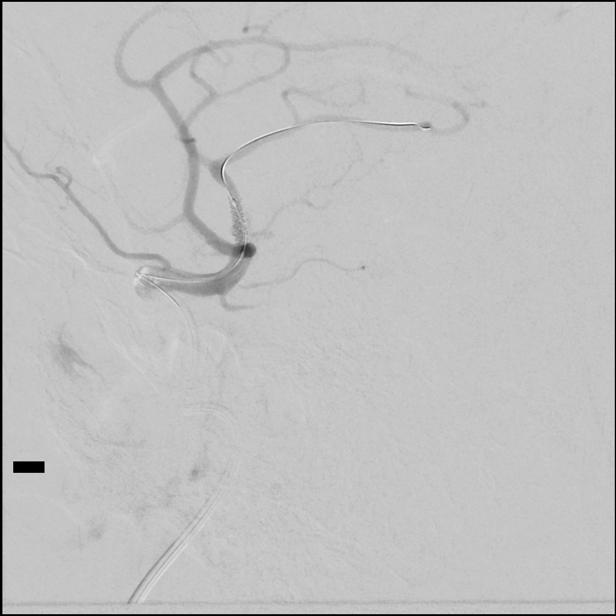
[im 123/193]
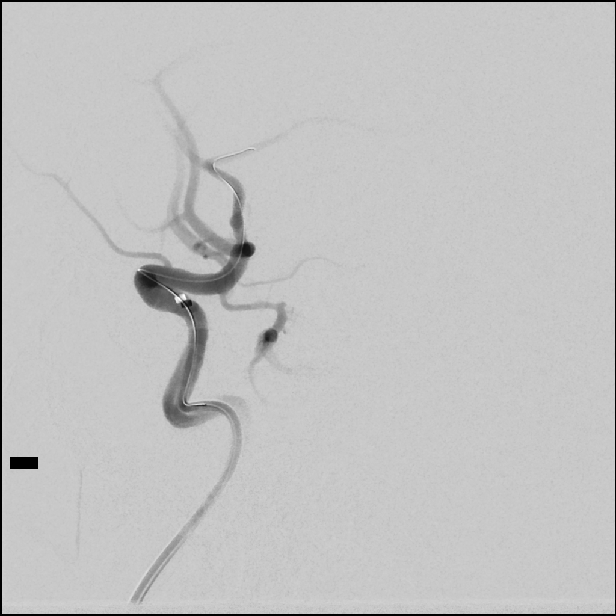
[im 149/193]
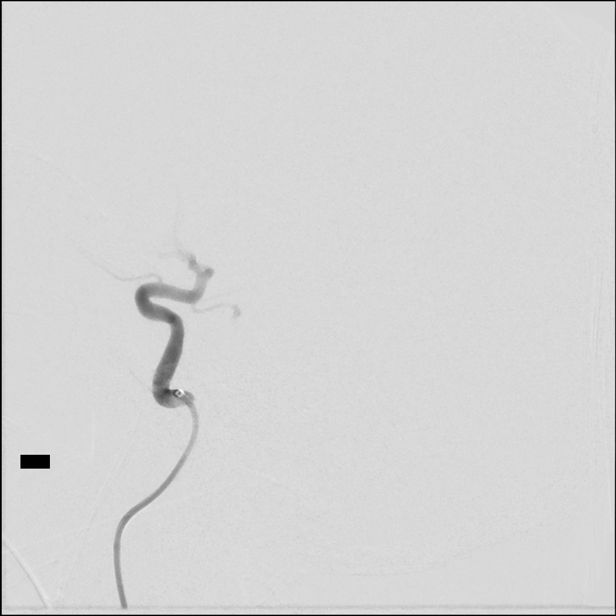
[im 166/193]
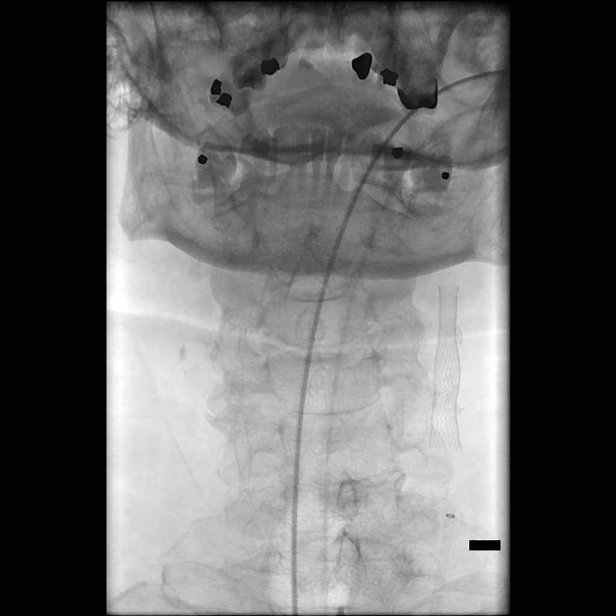
[im 184/193]
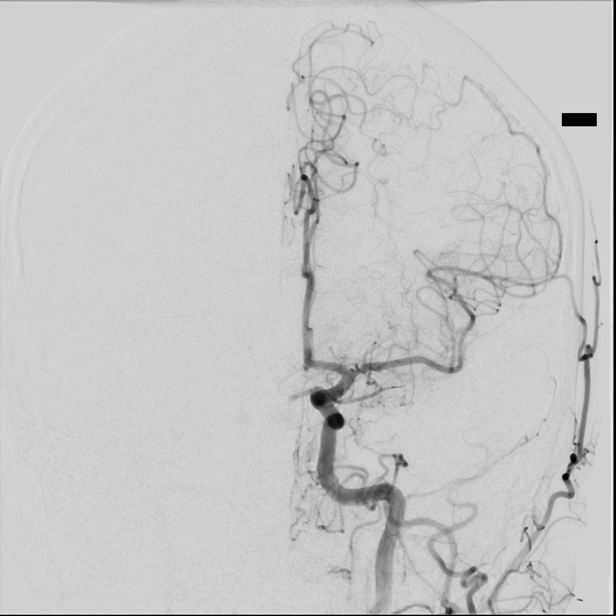

[10 of 24 positions shown; findings below may reference images not displayed]

MEDICATIONS:
Heparin 3,000 units IV. Ancef 2 g IV antibiotic was administered
within 1 hour of the procedure.

ANESTHESIA/SEDATION:
General anesthesia.

CONTRAST:  Isovue 300 approximately 120 cc.

FLUOROSCOPY TIME:  Fluoroscopy Time: 3 minutes 42 seconds ([MY]
mGy).

COMPLICATIONS:
None immediate.
The right forearm to the wrist was prepped and draped in the usual
sterile manner.

The right radial artery was then identified with ultrasound, and its
morphology documented.

A dorsal palmar anastomosis was verified to be present.

Using ultrasound guidance over a 0.018 inch micro guidewire, a [DATE]
French radial sheath was inserted without event. The obturator, and
the micro guidewire were removed. Good aspiration obtained from the
side port of the radial sheath. A cocktail of [MY] units of heparin,
200 mcg of nitroglycerin, and 2.5 mg of verapamil was then infused
in diluted form without event.

A right radial arteriogram was then performed.

Over a 0.035 inch Roadrunner guidewire, [REDACTED] 95 cm 7 French sheath
with a 5.5 WIL FREDO 2 support catheter was advanced to the
aortic arch region, and cannulation was performed without event of
the left common carotid artery.

An arteriogram was performed through the 5.5 French diagnostic
catheter in the left common carotid artery.

This demonstrated wide patency of the previously positioned left
common carotid artery bifurcation stent extending into the proximal
left internal carotid artery and the distal left common carotid
artery. No evidence of intra stent stenosis of intraluminal filling
defect was seen.

The more distal left internal carotid artery was seen to opacify to
the cranial skull base. The petrous, the cavernous and the
supraclinoid segments demonstrate wide patency.

The left anterior cerebral artery opacifies into the capillary and
venous phases.

The left middle cerebral artery demonstrates a severe focal stenosis
in the mid M1 segment worse on the oblique lateral projection.
FINDINGS: ENDOVASCULAR REVASCULARIZATION OF SEVERELY STENOTIC LEFT MIDDLE
CEREBRAL M1 SEGMENT

Using biplane roadmap technique, and constant heparinized saline
infusion, over a 0.035 Roadrunner guidewire, the 7 French [REDACTED]
sheath was then advanced and in conjunction with a 115 cm 5 French
Catalyst guide catheter to the horizontal petrous segment. The
guidewire was removed. Good aspiration obtained from the hub of the
5 French Catalyst guide catheter.

Whole head DSA was then performed in the magnified oblique AP and
lateral projections.

Measurements were then performed of the left middle cerebral artery
proximally in its most normal appearing caliber, and distally in the
distal M1 segment.

The length of the intended stent was also measured.

Over a 0.014 inch standard Synchro micro guidewire with a J-tip
configuration, an 021 microcatheter was then advanced without
difficulty using a torque device across the stenotic left middle
cerebral artery into the inferior division M2 M3 region followed by
the microcatheter. The guidewire was removed. Good aspiration
obtained from the hub of the microcatheter. Gentle control
arteriogram performed through the microcatheter demonstrated safe
position of the tip of the microcatheter. This in turn was then
exchanged for an 014 inch 300 cm Zoom exchange guidewire under
constant fluoroscopic guidance. Distal end of the exchange wire had
a J configuration. Following removal of the microcatheter, control
arteriogram performed through the 5 French Catalyst guide catheter
in the cavernous left ICA demonstrated no change intracranially. At
this time a 2 mm x 12 mm Resolute Onyx balloon mounted stent was
prepped and purged with 50% heparinized saline infusion and 50%
contrast, and retrogradely with heparinized saline infusion.

Using the rapid exchange technique, the stent delivery system was
then advanced without difficulty to the supraclinoid left ICA.

The system was then advanced into the left middle cerebral artery to
be placed almost adequate distance with stenotic segment in the
middle through the stented segment of the left middle cerebral
artery without evidence of distal filling defects or of occlusions.

The left anterior cerebral artery maintained normal opacification
into the capillary and venous phases.

Thereafter control inflation was then performed with a micro
inflation syringe device via micro tubing to deploy the stent. Two
inflations were performed initially to 10.4 atmospheres, and a
second slightly more proximally 10.1 atmospheres. At both sites it
was maintained for approximately 30 seconds. The balloon was then
deflated and retrieved proximally.

Control arteriogram performed through the 5 French Catalyst guide
catheter in the cavernous left ICA demonstrated excellent apposition
of flow of the stent through the left middle cerebral proximally and
distally in its branches.

Control arteriogram was then performed at 15 and 30 minutes post the
second balloon angioplasty with stent deliverance. These continued
to demonstrate excellent flow through the internal carotid artery
distally, the left middle cerebral artery and the left anterior
cerebral arteries.

A final control arteriogram performed at 30 minutes following
removal of the exchange micro guidewire from the 7 French [REDACTED] guide
sheath in the left common carotid artery demonstrated excellent flow
through the stented left internal carotid proximally to the cranial
skull base. The petrous, cavernous and supraclinoid segments remain
widely patent.

The left middle cerebral artery and the left anterior cerebral
artery demonstrated significantly improved caliber and flow through
the MCA distribution. No angiographic evidence of intra stent
filling defects or of occlusion was seen.

Throughout the procedure, the patient's blood pressure and
neurological status remained stable.

The 7 French sheath was then retrieved and removed. Hemostasis was
then achieved at the right radial puncture site with a wrist band.
Distal right radial pulse was verified to be present.

A postprocedural CT scan of the brain demonstrated no evidence of
intracranial hemorrhage, or mass effect or midline shift.

Patient's general anesthesia was then reversed and the patient was
extubated. Upon recovery, the patient was able to WIL FREDO simple
commands appropriately. No change was seen otherwise. He has
intermittent difficulty in comprehension. No facial droop or
dysarthria was seen.

The patient was able to move all 4 extremities equally like just
prior to the treatment.

He was then transferred to the PACU and then neuro ICU on low-dose
IV heparin, close monitoring of blood pressure and neurological
status.

Overnight the patient remained stable. He was able to tolerate free
liquids. The next morning, the IV heparin was stopped and patient
was switched to aspirin 81 mg a day, and Plavix 75 mg a day.

Neurologically the patient remained stable. Patient was then stable
to be returned to the primary service.

Patient was advised to maintain adequate hydration and to take his
dual antiplatelets in the addition to his other medications.
IMPRESSION: Status post endovascular revascularization of symptomatic high-grade
left middle cerebral M1 stenosis with stent assisted angioplasty.

PLAN:
Follow-up in the clinic 2 weeks post discharge. Aspirin 81 mg a day,
and Plavix 75 mg a day.

## 2020-06-04 SURGERY — IR WITH ANESTHESIA
Anesthesia: General | Laterality: Left

## 2020-06-04 MED ORDER — FENTANYL CITRATE (PF) 100 MCG/2ML IJ SOLN
INTRAMUSCULAR | Status: AC
  Filled 2020-06-04: qty 4

## 2020-06-04 MED ORDER — ASPIRIN 81 MG PO CHEW: 324.0000 mg | CHEWABLE_TABLET | Freq: Every day | ORAL | Status: DC

## 2020-06-04 MED ORDER — NITROGLYCERIN 1 MG/10 ML FOR IR/CATH LAB
INTRA_ARTERIAL | Status: AC
  Filled 2020-06-04: qty 10

## 2020-06-04 MED ORDER — FENTANYL CITRATE (PF) 100 MCG/2ML IJ SOLN
INTRAMUSCULAR | Status: DC | PRN
  Administered 2020-06-04 (×2): 100 ug via INTRAVENOUS

## 2020-06-04 MED ORDER — FENTANYL CITRATE (PF) 100 MCG/2ML IJ SOLN: 25.0000 ug | INTRAMUSCULAR | Status: DC | PRN

## 2020-06-04 MED ORDER — EPTIFIBATIDE 20 MG/10ML IV SOLN
INTRAVENOUS | Status: AC
  Filled 2020-06-04: qty 10

## 2020-06-04 MED ORDER — IOHEXOL 300 MG/ML  SOLN
50.0000 mL | Freq: Once | INTRAMUSCULAR | Status: AC | PRN
  Administered 2020-06-04: 50 mL via INTRA_ARTERIAL

## 2020-06-04 MED ORDER — EPHEDRINE SULFATE-NACL 50-0.9 MG/10ML-% IV SOSY
PREFILLED_SYRINGE | INTRAVENOUS | Status: DC | PRN
  Administered 2020-06-04: 10 mg via INTRAVENOUS
  Administered 2020-06-04: 5 mg via INTRAVENOUS

## 2020-06-04 MED ORDER — VERAPAMIL HCL 2.5 MG/ML IV SOLN
INTRAVENOUS | Status: AC
  Filled 2020-06-04: qty 2

## 2020-06-04 MED ORDER — SODIUM CHLORIDE 0.9 % IV SOLN: INTRAVENOUS | Status: DC | PRN

## 2020-06-04 MED ORDER — EPTIFIBATIDE 20 MG/10ML IV SOLN
INTRAVENOUS | Status: DC | PRN
  Administered 2020-06-04 (×3): 2 mg via INTRAVENOUS

## 2020-06-04 MED ORDER — CHLORHEXIDINE GLUCONATE 0.12 % MT SOLN
OROMUCOSAL | Status: AC
  Administered 2020-06-04: 15 mL
  Filled 2020-06-04: qty 15

## 2020-06-04 MED ORDER — CLOPIDOGREL BISULFATE 75 MG PO TABS
75.0000 mg | ORAL_TABLET | Freq: Every day | ORAL | Status: DC
  Administered 2020-06-05: 75 mg via ORAL
  Filled 2020-06-04: qty 1

## 2020-06-04 MED ORDER — CHLORHEXIDINE GLUCONATE CLOTH 2 % EX PADS
6.0000 | MEDICATED_PAD | Freq: Every day | CUTANEOUS | Status: DC
  Administered 2020-06-04 – 2020-06-05 (×2): 6 via TOPICAL

## 2020-06-04 MED ORDER — PROPOFOL 10 MG/ML IV BOLUS
INTRAVENOUS | Status: DC | PRN
  Administered 2020-06-04: 110 mg via INTRAVENOUS
  Administered 2020-06-04: 50 mg via INTRAVENOUS

## 2020-06-04 MED ORDER — ONDANSETRON HCL 4 MG/2ML IJ SOLN
INTRAMUSCULAR | Status: DC | PRN
  Administered 2020-06-04: 4 mg via INTRAVENOUS

## 2020-06-04 MED ORDER — HEPARIN SODIUM (PORCINE) 1000 UNIT/ML IJ SOLN
INTRAMUSCULAR | Status: DC | PRN
  Administered 2020-06-04: 500 [IU] via INTRAVENOUS
  Administered 2020-06-04: 1000 [IU] via INTRAVENOUS
  Administered 2020-06-04: 500 [IU] via INTRAVENOUS

## 2020-06-04 MED ORDER — ASPIRIN 325 MG PO TABS
325.0000 mg | ORAL_TABLET | Freq: Every day | ORAL | Status: DC
  Administered 2020-06-05: 325 mg via ORAL
  Filled 2020-06-04: qty 1

## 2020-06-04 MED ORDER — VERAPAMIL HCL 2.5 MG/ML IV SOLN: INTRA_ARTERIAL | Status: DC | PRN

## 2020-06-04 MED ORDER — HEPARIN (PORCINE) 25000 UT/250ML-% IV SOLN
500.0000 [IU]/h | INTRAVENOUS | Status: DC
  Administered 2020-06-04: 500 [IU]/h via INTRAVENOUS
  Filled 2020-06-04: qty 250

## 2020-06-04 MED ORDER — ONDANSETRON HCL 4 MG/2ML IJ SOLN: 4.0000 mg | Freq: Once | INTRAMUSCULAR | Status: DC | PRN

## 2020-06-04 MED ORDER — LIDOCAINE 2% (20 MG/ML) 5 ML SYRINGE
INTRAMUSCULAR | Status: DC | PRN
  Administered 2020-06-04: 60 mg via INTRAVENOUS

## 2020-06-04 MED ORDER — SODIUM CHLORIDE 0.9 % IV SOLN: INTRAVENOUS | Status: DC

## 2020-06-04 MED ORDER — CLOPIDOGREL BISULFATE 75 MG PO TABS: 75.0000 mg | ORAL_TABLET | Freq: Every day | ORAL | Status: DC

## 2020-06-04 MED ORDER — ACETAMINOPHEN 325 MG PO TABS: 650.0000 mg | ORAL_TABLET | ORAL | Status: DC | PRN

## 2020-06-04 MED ORDER — IOHEXOL 300 MG/ML  SOLN
100.0000 mL | Freq: Once | INTRAMUSCULAR | Status: AC | PRN
  Administered 2020-06-04: 100 mL via INTRA_ARTERIAL

## 2020-06-04 MED ORDER — SUGAMMADEX SODIUM 200 MG/2ML IV SOLN
INTRAVENOUS | Status: DC | PRN
  Administered 2020-06-04 (×2): 200 mg via INTRAVENOUS

## 2020-06-04 MED ORDER — LIDOCAINE HCL 1 % IJ SOLN
INTRAMUSCULAR | Status: DC | PRN
  Administered 2020-06-04: 10 mL

## 2020-06-04 MED ORDER — NALOXONE HCL 0.4 MG/ML IJ SOLN
INTRAMUSCULAR | Status: DC | PRN
  Administered 2020-06-04 (×2): 100 ug via INTRAVENOUS

## 2020-06-04 MED ORDER — LIDOCAINE HCL 1 % IJ SOLN
INTRAMUSCULAR | Status: AC
  Filled 2020-06-04: qty 20

## 2020-06-04 MED ORDER — ACETAMINOPHEN 160 MG/5ML PO SOLN: 650.0000 mg | ORAL | Status: DC | PRN

## 2020-06-04 MED ORDER — ROCURONIUM BROMIDE 10 MG/ML (PF) SYRINGE
PREFILLED_SYRINGE | INTRAVENOUS | Status: DC | PRN
  Administered 2020-06-04 (×2): 10 mg via INTRAVENOUS
  Administered 2020-06-04: 70 mg via INTRAVENOUS

## 2020-06-04 MED ORDER — PHENYLEPHRINE 40 MCG/ML (10ML) SYRINGE FOR IV PUSH (FOR BLOOD PRESSURE SUPPORT)
PREFILLED_SYRINGE | INTRAVENOUS | Status: DC | PRN
  Administered 2020-06-04 (×2): 80 ug via INTRAVENOUS
  Administered 2020-06-04: 120 ug via INTRAVENOUS
  Administered 2020-06-04: 80 ug via INTRAVENOUS

## 2020-06-04 MED ORDER — CLEVIDIPINE BUTYRATE 0.5 MG/ML IV EMUL
INTRAVENOUS | Status: AC
  Filled 2020-06-04: qty 50

## 2020-06-04 MED ORDER — PHENYLEPHRINE HCL-NACL 10-0.9 MG/250ML-% IV SOLN
INTRAVENOUS | Status: DC | PRN
  Administered 2020-06-04: 25 ug/min via INTRAVENOUS

## 2020-06-04 MED ORDER — HEPARIN (PORCINE) 25000 UT/250ML-% IV SOLN
700.0000 [IU]/h | INTRAVENOUS | Status: DC
  Administered 2020-06-04: 700 [IU]/h via INTRAVENOUS
  Filled 2020-06-04: qty 250

## 2020-06-04 MED ORDER — HEPARIN SODIUM (PORCINE) 1000 UNIT/ML IJ SOLN
INTRAMUSCULAR | Status: AC
  Filled 2020-06-04: qty 1

## 2020-06-04 MED ORDER — ACETAMINOPHEN 650 MG RE SUPP: 650.0000 mg | RECTAL | Status: DC | PRN

## 2020-06-04 MED ORDER — CLEVIDIPINE BUTYRATE 0.5 MG/ML IV EMUL
0.0000 mg/h | INTRAVENOUS | Status: AC
  Administered 2020-06-04: 3 mg/h via INTRAVENOUS
  Administered 2020-06-04: 12 mg/h via INTRAVENOUS
  Administered 2020-06-05: 8 mg/h via INTRAVENOUS
  Filled 2020-06-04 (×2): qty 50

## 2020-06-04 MED ORDER — LABETALOL HCL 5 MG/ML IV SOLN
INTRAVENOUS | Status: DC | PRN
  Administered 2020-06-04: 2.5 mg via INTRAVENOUS

## 2020-06-04 MED ORDER — CEFAZOLIN SODIUM-DEXTROSE 2-4 GM/100ML-% IV SOLN
INTRAVENOUS | Status: AC
  Filled 2020-06-04: qty 100

## 2020-06-04 MED ORDER — SODIUM CHLORIDE (PF) 0.9 % IJ SOLN
INTRAVENOUS | Status: DC | PRN
  Administered 2020-06-04: 200 ug via INTRA_ARTERIAL

## 2020-06-04 NOTE — Progress Notes (Deleted)
Cardiology Office Note   Date:  06/04/2020   ID:  [name], DOB October 15, 1950, MRN [id number]  PCP:  Roger Kill, PA-C  Cardiologist:   None Referring:  ***  No chief complaint on file.     History of Present Illness: [name] is a 70 y.o. male who is referred by *** for evaluation of a CVA ***.       Past Medical History:  Diagnosis Date  . Hypertension   . Myocardial infarction (HCC)    1990's  . Stroke [facility]) 03/15/2020    Past Surgical History:  Procedure Laterality Date  . IR ANGIO INTRA EXTRACRAN SEL COM CAROTID INNOMINATE BILAT MOD SED  05/02/2020  . IR ANGIO VERTEBRAL SEL SUBCLAVIAN INNOMINATE UNI L MOD SED  05/02/2020  . IR ANGIO VERTEBRAL SEL SUBCLAVIAN INNOMINATE UNI R MOD SED  05/20/2020  . IR ANGIO VERTEBRAL SEL VERTEBRAL UNI R MOD SED  05/02/2020  . IR INTRAVSC STENT CERV CAROTID W/EMB-PROT MOD SED INCL ANGIO  05/20/2020  . IR RADIOLOGIST EVAL & MGMT  03/27/2020  . IR US GUIDE VASC ACCESS RIGHT  05/02/2020  . IR US GUIDE VASC ACCESS RIGHT  05/20/2020  . RADIOLOGY WITH ANESTHESIA Left 05/20/2020   Procedure: RADIOLOGY WITH ANESTHESIA  LEFT ICA STENTING;  Surgeon: Julieanne Cotton, MD;  Location: [facility] OR;  Service: Radiology;  Laterality: Left;     No current facility-administered medications for this visit.   No current outpatient medications on file.   Facility-Administered Medications Ordered in Other Visits  Medication Dose Route Frequency Provider Last Rate Last Admin  . acetaminophen (TYLENOL) tablet 650 mg  650 mg Oral Q4H PRN Pieter Partridge, MD       Or  . acetaminophen (TYLENOL) 160 MG/5ML solution 650 mg  650 mg Per Tube Q4H PRN Pieter Partridge, MD       Or  . acetaminophen (TYLENOL) suppository 650 mg  650 mg Rectal Q4H PRN Leandro Reasoner Tublu, MD      . aspirin chewable tablet 81 mg  81 mg Oral Daily Leandro Reasoner Tublu, MD   81 mg at 06/03/20 0846  . atorvastatin (LIPITOR) tablet 80 mg  80 mg  Oral Daily Osvaldo Shipper, MD   80 mg at 06/03/20 0846  . ceFAZolin (ANCEF) IVPB 2g/100 mL premix  2 g Intravenous to XRAY [place], Jamie R, NP      . clopidogrel (PLAVIX) tablet 75 mg  75 mg Oral Daily Marvel Plan, MD   75 mg at 06/03/20 0846  . enoxaparin (LOVENOX) injection 40 mg  40 mg Subcutaneous Q24H Leandro Reasoner Tublu, MD   40 mg at 06/02/20 2108    Allergies:   Ciprodex [ciprofloxacin-dexamethasone] and Metformin and related    Social History:  The patient  reports that he quit smoking about 19 years ago. His smoking use included cigarettes. He has never used smokeless tobacco. He reports current alcohol use. He reports that he does not use drugs.   Family History:  The patient's ***family history includes Heart attack in his father; Stroke in his paternal great-grandfather.    ROS:  Please see the history of present illness.   Otherwise, review of systems are positive for {NONE DEFAULTED:18576::"none"}.   All other systems are reviewed and negative.    PHYSICAL EXAM: VS:  There were no vitals taken for this visit. , BMI There is no height or weight on file to calculate BMI. GENERAL:  Well appearing  HEENT:  Pupils equal round and reactive, fundi not visualized, oral mucosa unremarkable NECK:  No jugular venous distention, waveform within normal limits, carotid upstroke brisk and symmetric, no bruits, no thyromegaly LYMPHATICS:  No cervical, inguinal adenopathy LUNGS:  Clear to auscultation bilaterally BACK:  No CVA tenderness CHEST:  Unremarkable HEART:  PMI not displaced or sustained,S1 and S2 within normal limits, no S3, no S4, no clicks, no rubs, *** murmurs ABD:  Flat, positive bowel sounds normal in frequency in pitch, no bruits, no rebound, no guarding, no midline pulsatile mass, no hepatomegaly, no splenomegaly EXT:  2 plus pulses throughout, no edema, no cyanosis no clubbing SKIN:  No rashes no nodules NEURO:  Cranial nerves II through XII grossly intact,  motor grossly intact throughout PSYCH:  Cognitively intact, oriented to person place and time    EKG:  EKG {ACTION; IS/IS OEH:[id number]} ordered today. The ekg ordered today demonstrates ***   Recent Labs: 05/31/2020: ALT 26 06/02/2020: BUN 20; Creatinine, Ser 1.09; Potassium 3.9; Sodium 137 06/04/2020: Hemoglobin 9.5; Platelets 252    Lipid Panel    Component Value Date/Time   CHOL 92 06/01/2020 0231   TRIG 40 06/01/2020 0231   HDL 42 06/01/2020 0231   CHOLHDL 2.2 06/01/2020 0231   VLDL 8 06/01/2020 0231   LDLCALC 42 06/01/2020 0231      Wt Readings from Last 3 Encounters:  05/20/20 188 lb 11.4 oz (85.6 kg)  05/20/20 188 lb 11.4 oz (85.6 kg)  05/16/20 188 lb 12.8 oz (85.6 kg)      Other studies Reviewed: Additional studies/ records that were reviewed today include: ***. Review of the above records demonstrates:  Please see elsewhere in the note.  ***   ASSESSMENT AND PLAN:  ***   Current medicines are reviewed at length with the patient today.  The patient {ACTIONS; HAS/DOES NOT HAVE:19233} concerns regarding medicines.  The following changes have been made:  {PLAN; NO CHANGE:13088:s}  Labs/ tests ordered today include: *** No orders of the defined types were placed in this encounter.    Disposition:   FU with ***    Signed, Rollene Rotunda, MD  06/04/2020 8:14 AM    [facility]

## 2020-06-04 NOTE — Progress Notes (Signed)
OT Cancellation Note  Patient Details Name: GERAL COKER MRN: [id number] DOB: 1950/02/20   Cancelled Treatment:    Reason Eval/Treat Not Completed: Patient at procedure or test/ unavailable--pt off unit.  Will follow and see as able.   Barry Brunner, OT Acute Rehabilitation Services Pager [phone] Office [phone]   Chancy Milroy 06/04/2020, 10:54 AM

## 2020-06-04 NOTE — Anesthesia Procedure Notes (Signed)
Procedure Name: Intubation Date/Time: 06/04/2020 12:10 PM Performed by: Janace Litten, CRNA Pre-anesthesia Checklist: Patient identified, Emergency Drugs available, Suction available and Patient being monitored Patient Re-evaluated:Patient Re-evaluated prior to induction Oxygen Delivery Method: Circle System Utilized Preoxygenation: Pre-oxygenation with 100% oxygen Induction Type: IV induction Laryngoscope Size: Mac and 4 Grade View: Grade I Tube type: Oral Tube size: 7.5 mm Number of attempts: 1 Airway Equipment and Method: Stylet Placement Confirmation: ETT inserted through vocal cords under direct vision,  positive ETCO2 and breath sounds checked- equal and bilateral Secured at: 21 cm Tube secured with: Tape Dental Injury: Teeth and Oropharynx as per pre-operative assessment

## 2020-06-04 NOTE — Procedures (Signed)
S/P Lt common carotid arteriogram followed stent asisted angioplasty of symptomatic severe Lr MCA stenosis. Post CT  Brain  No ICH. Patient extubated. Responds appropriately. Move all 4s equally. Pupils 92mm Rt =Lt No facial asymmetry. Tongue midline. Distal Rt rad pulse present. S.Benelli Winther MD

## 2020-06-04 NOTE — Progress Notes (Addendum)
1600:  Patient arrived to 4NICU from PACU.  Patient is alert to self.  Right radial TR Band clean dry and intact.  Patient's daughter call and updated.  Pt's belongings received from 3W.

## 2020-06-04 NOTE — Progress Notes (Signed)
PT Cancellation Note  Patient Details Name: ATHENS LEBEAU MRN: [id number] DOB: 11-01-1950   Cancelled Treatment:    Reason Eval/Treat Not Completed: Patient at procedure or test/unavailable  Pt off floor at procedure. Will follow up.  Blake Divine A Cigi Bega 06/04/2020, 11:11 AM [place], PT, DPT Acute Rehabilitation Services Pager [phone] Office [phone]

## 2020-06-04 NOTE — Progress Notes (Signed)
[facility] PROGRESS NOTE   VERNARD GRAM GQQ:[id number] DOB: 02/16/1950 DOA: 05/31/2020  PCP: Roger Kill, PA-C  Brief History/Interval Summary: 70 y.o. male with history of prior PCA stroke and placement of ICA stent for left ICA stenosis on 5/2 by interventional radiology, was found by his friend today to be altered.  He was also noted to have decreased oral intake.  Patient has some expressive aphasia at baseline.  Patient was hospitalized for further management.  MRI suggested a new stroke.  Patient was seen by neurology.  Plan is for cerebral angiogram today.  Reason for Visit: Acute stroke  Consultants: Neurology.  Interventional radiology  Procedures: Cerebral angiogram is planned for 5/17  Antibiotics: Anti-infectives (From admission, onward)   Start     Dose/Rate Route Frequency Ordered Stop   06/04/20 0000  ceFAZolin (ANCEF) IVPB 2g/100 mL premix        2 g 200 mL/hr over 30 Minutes Intravenous To Radiology 06/01/20 1116 06/05/20 0000      Subjective/Interval History: Patient denies any complaints.  His expressive receptive aphasia makes it difficult to get accurate information from him.  Does not appear to be in any discomfort.    Assessment/Plan:  Acute stroke MRI showed an acute to early subacute ischemic infarct involving the left temporal occipital region.  Patient has had multiple strokes in the last few months.  He underwent left ICA stent placement on 5/2 which was done by interventional radiology.   Patient was supposed to be on aspirin and Plavix which is being continued here.   LDL 42.  HbA1c 5.9.  Patient noted to be on statin at home. PT OT SLP evaluation is ongoing.  From a physical functioning standpoint he seems to be doing well but due to the significant aphasia is thought to require 24/7 supervision.  He does not have supervision at home.  He lives by himself.  This will be an unsafe situation for him.  Would benefit from going to  skilled nursing facility for rehab. Neurology continues to follow.  Plan is for cerebral angiogram by interventional radiology today.  Carotid artery stenosis Underwent stent placement to left ICA on 5/2, by interventional radiology.  To undergo cerebral angiogram today.  Wernicke's aphasia Patient's inability to answer questions most likely is due to his significant aphasia.  There is also significant receptive aphasia as per speech therapy.  This could potentially be a safety issue especially as patient apparently lives by himself.  As per SLP patient will need 24-hour supervision.  Hypotension in the setting of known history of hypertension His antihypertensives including HCTZ and lisinopril were held.   He was given fluid boluses and started on IV fluids.  Blood pressures have improved.  We will stop IV fluids. Hypotension thought to be due to hypovolemia.  Acute kidney injury Came in with a creatinine of 1.5.  Baseline appears to be 0.87.  Baseline around 0.8.  Per history he has had poor oral intake.  Could have been dehydrated.   He was given IV fluids with improvement in renal function.  Monitor urine output.    Normocytic anemia Hemoglobin was 13.1 in April.  Over the last 2 weeks it has been in the 9-10 range.  No overt bleeding noted.   Anemia panel shows ferritin 446, iron 109, TIBC 337, folate 28.8, vitamin B12 688.   Further evaluation in the outpatient setting.  Diabetes mellitus type 2, diet controlled HbA1c 5.9.  Not noted to be  on any glucose lowering agents at home.  No need to place him on sliding scale coverage at this time.  Concern for depression Daughter raised concern that patient may be depressed.  Apparently he has been talking about death to his friends and family.  Unable to assess suicidal ideation at this time.   May benefit from being seen by psychiatry especially since his family appears to be quite concerned about this.   We will request psychiatry  consultation.   DVT Prophylaxis: Lovenox Code Status: Full code Family Communication: Daughter being updated periodically. Disposition Plan: From a physical function standpoint patient appears to not require intensive rehabilitation however patient does have significant receptive aphasia which creates a safety issues especially since patient lives by himself.  He will need 24 7 supervision.  Status is: Inpatient  Remains inpatient appropriate because:Ongoing diagnostic testing needed not appropriate for outpatient work up, IV treatments appropriate due to intensity of illness or inability to take PO and Inpatient level of care appropriate due to severity of illness   Dispo: The patient is from: Home              Anticipated d/c is to: To be determined              Patient currently is not medically stable to d/c.   Difficult to place patient No      Medications:  Scheduled: . aspirin  81 mg Oral Daily  . atorvastatin  80 mg Oral Daily  . clopidogrel  75 mg Oral Daily  . enoxaparin (LOVENOX) injection  40 mg Subcutaneous Q24H   Continuous: .  ceFAZolin (ANCEF) IV     BSJ:[id number] **OR** acetaminophen (TYLENOL) oral liquid 160 mg/5 mL **OR** acetaminophen   Objective:  Vital Signs  Vitals:   06/03/20 1952 06/03/20 2353 06/04/20 0358 06/04/20 0746  BP: 123/73 115/69 113/67 (!) 119/57  Pulse: 63 65 65 (!) 58  Resp: 18 18 18 18   Temp: 98 F (36.7 C) 98.1 F (36.7 C) 98.1 F (36.7 C) 97.9 F (36.6 C)  TempSrc: Oral Oral Oral Oral  SpO2: 100% 99% 97% 98%   No intake or output data in the 24 hours ending 06/04/20 1031 There were no vitals filed for this visit.   General appearance: Awake alert.  In no distress Resp: Clear to auscultation bilaterally.  Normal effort Cardio: S1-S2 is normal regular.  No S3-S4.  No rubs murmurs or bruit GI: Abdomen is soft.  Nontender nondistended.  Bowel sounds are present normal.  No masses organomegaly Extremities: No edema.   Full range of motion of lower extremities. Neurologic: No obvious deficits except for aphasia.    Lab Results:  Data Reviewed: I have personally reviewed following labs and imaging studies  CBC: Recent Labs  Lab 05/31/20 1411 06/01/20 0231 06/02/20 0425 06/04/20 0021  WBC 8.5 6.5 6.3 6.5  NEUTROABS 6.7  --   --  4.1  HGB 10.8* 9.9* 10.2* 9.5*  HCT 33.0* 30.3* 31.0* 28.2*  MCV 99.1 97.4 98.1 96.9  PLT 364 371 284 252    Basic Metabolic Panel: Recent Labs  Lab 05/31/20 1411 06/01/20 0231 06/02/20 0425  NA 135 136 137  K 4.0 3.5 3.9  CL 99 102 103  CO2 28 28 29   GLUCOSE 111* 109* 106*  BUN 35* 31* 20  CREATININE 1.52* 1.19 1.09  CALCIUM 10.5* 9.6 10.0    GFR: CrCl cannot be calculated (Unknown ideal weight.).  Liver Function Tests: Recent Labs  Lab 05/31/20 1411  AST 26  ALT 26  ALKPHOS 62  BILITOT 2.0*  PROT 7.3  ALBUMIN 4.1     HbA1C: No results for input(s): HGBA1C in the last 72 hours.  CBG: Recent Labs  Lab 05/31/20 1338  GLUCAP 107*     Recent Results (from the past 240 hour(s))  Resp Panel by RT-PCR (Flu A&B, Covid)     Status: None   Collection Time: 05/31/20  3:34 PM  Result Value Ref Range Status   SARS Coronavirus 2 by RT PCR NEGATIVE NEGATIVE Final    Comment: (NOTE) SARS-CoV-2 target nucleic acids are NOT DETECTED.  The SARS-CoV-2 RNA is generally detectable in upper respiratory specimens during the acute phase of infection. The lowest concentration of SARS-CoV-2 viral copies this assay can detect is 138 copies/mL. A negative result does not preclude SARS-Cov-2 infection and should not be used as the sole basis for treatment or other patient management decisions. A negative result may occur with  improper specimen collection/handling, submission of specimen other than nasopharyngeal swab, presence of viral mutation(s) within the areas targeted by this assay, and inadequate number of viral copies(<138 copies/mL). A negative  result must be combined with clinical observations, patient history, and epidemiological information. The expected result is Negative.  Fact Sheet for Patients:  BloggerCourse.com  Fact Sheet for Healthcare Providers:  SeriousBroker.it  This test is no t yet approved or cleared by the [place] FDA and  has been authorized for detection and/or diagnosis of SARS-CoV-2 by FDA under an Emergency Use Authorization (EUA). This EUA will remain  in effect (meaning this test can be used) for the duration of the COVID-19 declaration under Section 564(b)(1) of the Act, 21 U.S.C.section 360bbb-3(b)(1), unless the authorization is terminated  or revoked sooner.       Influenza A by PCR NEGATIVE NEGATIVE Final   Influenza B by PCR NEGATIVE NEGATIVE Final    Comment: (NOTE) The Xpert Xpress SARS-CoV-2/FLU/RSV plus assay is intended as an aid in the diagnosis of influenza from Nasopharyngeal swab specimens and should not be used as a sole basis for treatment. Nasal washings and aspirates are unacceptable for Xpert Xpress SARS-CoV-2/FLU/RSV testing.  Fact Sheet for Patients: BloggerCourse.com  Fact Sheet for Healthcare Providers: SeriousBroker.it  This test is not yet approved or cleared by the [place] FDA and has been authorized for detection and/or diagnosis of SARS-CoV-2 by FDA under an Emergency Use Authorization (EUA). This EUA will remain in effect (meaning this test can be used) for the duration of the COVID-19 declaration under Section 564(b)(1) of the Act, 21 U.S.C. section 360bbb-3(b)(1), unless the authorization is terminated or revoked.  Performed at [facility], [address] [place], [place] [address]       Radiology Studies: No results found.     LOS: 4 days   Wah [employer] on [email or web address]  06/04/2020, 10:31  AM

## 2020-06-04 NOTE — Progress Notes (Signed)
SLP Cancellation Note  Patient Details Name: [name] MRN: [id number] DOB: 1950-10-27   Cancelled treatment:       Reason Eval/Treat Not Completed: Patient at procedure or test/unavailable    Mahala Menghini., M.A. CCC-SLP Acute Rehabilitation Services Pager [phone] Office [phone]  06/04/2020, 12:07 PM

## 2020-06-04 NOTE — Anesthesia Preprocedure Evaluation (Addendum)
Anesthesia Evaluation  Patient identified by MRN, date of birth, ID band Patient awake and Patient confused    Reviewed: Allergy & Precautions, NPO status , Patient's Chart, lab work & pertinent test results, reviewed documented beta blocker date and time   Airway Mallampati: III  TM Distance: >3 FB Neck ROM: Full    Dental  (+) Poor Dentition, Chipped, Caps, Missing, Loose, Dental Advisory Given   Pulmonary former smoker,    Pulmonary exam normal breath sounds clear to auscultation + decreased breath sounds      Cardiovascular hypertension, Pt. on medications + Past MI and + Peripheral Vascular Disease  Normal cardiovascular exam Rhythm:Regular Rate:Normal  Right Carotid stenosis  Left carotid stenosis   Neuro/Psych CVA, No Residual Symptoms negative psych ROS   GI/Hepatic negative GI ROS, Neg liver ROS,   Endo/Other  diabetes, Poorly Controlled, Type 2hyperlipidemia  Renal/GU negative Renal ROS  negative genitourinary   Musculoskeletal negative musculoskeletal ROS (+)   Abdominal   Peds  Hematology negative hematology ROS (+)   Anesthesia Other Findings   Reproductive/Obstetrics                            Anesthesia Physical Anesthesia Plan  ASA: III  Anesthesia Plan: General   Post-op Pain Management:    Induction: Intravenous  PONV Risk Score and Plan: 3 and Treatment may vary due to age or medical condition, Ondansetron and Dexamethasone  Airway Management Planned: Oral ETT  Additional Equipment: Arterial line  Intra-op Plan:   Post-operative Plan: Extubation in OR  Informed Consent: I have reviewed the patients History and Physical, chart, labs and discussed the procedure including the risks, benefits and alternatives for the proposed anesthesia with the patient or authorized representative who has indicated his/her understanding and acceptance.     Dental advisory  given  Plan Discussed with: CRNA and Anesthesiologist  Anesthesia Plan Comments:         Anesthesia Quick Evaluation

## 2020-06-04 NOTE — Sedation Documentation (Signed)
Pt arrived to IR suite with anesthesia CRNA Adelina Mings. Pt able to state name and DOB

## 2020-06-04 NOTE — Transfer of Care (Signed)
Immediate Anesthesia Transfer of Care Note  Patient: [name]  Procedure(s) Performed: LEFT MIDDLE CAROTID  ARTERY STENT (Left )  Patient Location: PACU  Anesthesia Type:General  Level of Consciousness: drowsy and patient cooperative  Airway & Oxygen Therapy: Patient Spontanous Breathing  Post-op Assessment: Report given to RN and Post -op Vital signs reviewed and stable  Post vital signs: Reviewed and stable  Last Vitals:  Vitals Value Taken Time  BP 119/74 06/04/20 1426  Temp    Pulse 84 06/04/20 1434  Resp 22 06/04/20 1434  SpO2 96 % 06/04/20 1434  Vitals shown include unvalidated device data.  Last Pain:  Vitals:   06/04/20 1100  TempSrc:   PainSc: 0-No pain      Patients Stated Pain Goal: 3 (06/04/20 1100)  Complications: No complications documented.

## 2020-06-04 NOTE — Progress Notes (Addendum)
ANTICOAGULATION CONSULT NOTE - Initial Consult  Pharmacy Consult for IV Heparin Indication:  Post-IR Procedure  Allergies  Allergen Reactions  . Ciprodex [Ciprofloxacin-Dexamethasone] Swelling    Ear canal swelling  . Metformin And Related Other (See Comments)    Upset stomach    Patient Measurements: Height: 5\' 10"  (177.8 cm) IBW/kg (Calculated) : 73  Most Recent Weight (05/20/20):  85.6 kg Heparin Dosing Weight:  85.6 kg  Vital Signs: Temp: 97.9 F (36.6 C) (05/17 1427) Temp Source: Oral (05/17 0746) BP: 106/54 (05/17 1457) Pulse Rate: 63 (05/17 1457)  Labs: Recent Labs    06/02/20 0425 06/04/20 0021  HGB 10.2* 9.5*  HCT 31.0* 28.2*  PLT 284 252  LABPROT  --  12.5  INR  --  0.9  CREATININE 1.09  --     Medical History: Past Medical History:  Diagnosis Date  . Altered mental status    Daughter 06/06/20   . HLD (hyperlipidemia)   . Hypertension   . Myocardial infarction (HCC)    1990's  . Stroke [facility]) 03/15/2020    Assessment: 70 yr old man with hx of acute/early subacute CVA in setting of L MCA stenosis. Pt is S/P image-guided cerebral arteriogram with revascularization of L MCA stenosis in IR with Dr 78 this afternoon.  Pharmacy is consulted to dose heparin, with plan to discontinue heparin at 0700 tomorrow, 5/18.  Pt was started on heparin infusion at 500 units/hr in PACU. H/H 9.5/28.2, plt 252. Per PACU RN, no bleeding issues observed post procedure.  Goal of Therapy:  Heparin level: 0.1-0.25 units/ml Monitor platelets by anticoagulation protocol: Yes   Plan:  Increase heparin infusion to 700 units/hr Check heparin level in 6 hrs Monitor daily heparin level, CBC while on heparin  Monitor for bleeding Heparin off at 0700 tomorrow, 5/18  6/18, PharmD, BCPS, [facility] Clinical Pharmacist 06/04/2020,3:27 PM   Heparin level 0.15 units/ml, cont same

## 2020-06-04 NOTE — TOC Progression Note (Signed)
Transition of Care [facility]) - Progression Note    Patient Details  Name: [name] MRN: [id number] Date of Birth: Jan 03, 1951  [facility]) CM/SW Contact  Kermit Balo, RN Phone Number: 06/04/2020, 10:44 AM  Clinical Narrative:    Daughter requested [facility] and they are not in network with patients insurance. She then asked for [facility]. They have offered a bed. CM has sent information in to [facility] for authorization.  Pt to have angiogram today. TOC following.   Expected Discharge Plan: Skilled Nursing Facility Barriers to Discharge: Continued Medical Work up  Expected Discharge Plan and Services Expected Discharge Plan: Skilled Nursing Facility In-house Referral: Clinical Social Work Discharge Planning Services: CM Consult Post Acute Care Choice: Skilled Nursing Facility Living arrangements for the past 2 months: Single Family Home                                       Social Determinants of Health (SDOH) Interventions    Readmission Risk Interventions No flowsheet data found.

## 2020-06-04 NOTE — Progress Notes (Addendum)
NIR.  Patient underwent an image-guided cerebral arteriogram with revascularization of left MCA severe stenosis using stent assisted angioplasty via right radial approach today by Dr. Corliss Skains.  Patient evaluated in PACU alongside Dr. Corliss Skains following procedure. Patient awake and alert laying in bed. Denies headache, N/V. Still with intermittent confusion, intermittently follows simple commands.  Alert and awake, still with intermittent confusion, intermittently follows simple commands. Speech still clear with word finding/identifying difficultly. PERRL bilaterally. Tongue midline. Moving all extremities. No pronator drift. Distal pulses (radial) palpable bilaterally. Right radial puncture site soft with TR band in place, no active bleeding or hematoma noted.  Plan to transfer to neuro ICU for overnight observation. Start Heparin gtt STAT. TR band deflation orders in place. Advance diet as tolerated. Further plans per TRH/neurology- appreciate and agree with management. NIR to follow.   ADDENDUM: Dr. Corliss Skains spoke with patient's daughter, Moises Blood, via telephone to update on procedure. All questions answered and concerns addressed.    Waylan Boga Kervin Bones, PA-C 06/04/2020, 3:28 PM

## 2020-06-04 NOTE — TOC Progression Note (Addendum)
Transition of Care [facility]) - Progression Note    Patient Details  Name: TRIGG DELAROCHA MRN: [id number] Date of Birth: Feb 01, 1950  Transition of Care [facility]) CM/SW Contact  Kermit Balo, RN Phone Number: 06/04/2020, 3:54 PM  Clinical Narrative:    Talbot Grumbling requested a peer to peer for SNF rehab. MD says it is going to be denied after the peer to peer. Daughter updated and is going to do an expedited appeal.  TOC following.  1700: Daughter has submitted expedited appeal. CM has faxed in information requested from [facility]. Case # [id number]   Expected Discharge Plan: Skilled Nursing Facility Barriers to Discharge: Continued Medical Work up  Expected Discharge Plan and Services Expected Discharge Plan: Skilled Nursing Facility In-house Referral: Clinical Social Work Discharge Planning Services: CM Consult Post Acute Care Choice: Skilled Nursing Facility Living arrangements for the past 2 months: Single Family Home                                       Social Determinants of Health (SDOH) Interventions    Readmission Risk Interventions No flowsheet data found.

## 2020-06-04 NOTE — H&P (Signed)
HPI:  The patient has had a H&P performed within the last 30 days, all history, medications, and exam have been reviewed. The patient denies any interval changes since the H&P.  Patient awake and alert sitting in bed watching TV. Still with intermittent confusion and intermittently follows simple commands. Still with clear speech and word finding/identifying difficultly. Moving all extremities.   Medications: Prior to Admission medications   Medication Sig Start Date End Date Taking? Authorizing Provider  acetaminophen (TYLENOL) 500 MG tablet Take 1,000 mg by mouth every 6 (six) hours as needed for moderate pain or headache.   Yes [provider]  aspirin 81 MG chewable tablet Chew 1 tablet (81 mg total) by mouth daily. 05/22/20  Yes Maryuri Warnke M, PA-C  atorvastatin (LIPITOR) 80 MG tablet Take 1 tablet (80 mg total) by mouth daily. 03/17/20  Yes Azucena Fallen, MD  b complex vitamins capsule Take 1 capsule by mouth daily.   Yes [provider]  clopidogrel (PLAVIX) 75 MG tablet Take 0.5 tablets (37.5 mg total) by mouth daily. 05/21/20 08/19/20 Yes Jamile Sivils M, PA-C  hydrochlorothiazide (HYDRODIURIL) 25 MG tablet Take 1 tablet (25 mg total) by mouth daily. 03/18/20  Yes Pahwani, Daleen Bo, MD  lisinopril (ZESTRIL) 40 MG tablet Take 40 mg by mouth daily.   Yes [provider]  potassium chloride SA (KLOR-CON) 20 MEQ tablet Take 1 tablet (20 mEq total) by mouth daily. 03/22/20  Yes Azucena Fallen, MD  traZODone (DESYREL) 50 MG tablet Take 50 mg by mouth at bedtime as needed for sleep. 04/28/20  Yes [provider]  VITAMIN D-VITAMIN K PO Take 1 capsule by mouth daily.   Yes [provider]     Vital Signs: BP (!) 119/57 (BP Location: Right Arm)   Pulse (!) 58   Temp 97.9 F (36.6 C) (Oral)   Resp 18   SpO2 98%   Physical Exam Vitals and nursing note reviewed.  Constitutional:      General: He is not in acute  distress. Cardiovascular:     Rate and Rhythm: Normal rate and regular rhythm.     Heart sounds: Normal heart sounds. No murmur heard.   Pulmonary:     Effort: Pulmonary effort is normal. No respiratory distress.     Breath sounds: Normal breath sounds. No wheezing.  Skin:    General: Skin is warm and dry.  Neurological:     Mental Status: He is alert.     Comments: Alert and awake. Intermittent confusion, intermittently follows simple commands. Speech clear but has word finding/identifying difficultly. PERRL bilaterally. Moving all extremities. No pronator drift. Fine motor and coordination intact, however intermittently follows simple commands.      Mallampati Score:  MD Evaluation Airway: WNL Heart: WNL Abdomen: WNL Chest/ Lungs: WNL ASA  Classification: 3 Mallampati/Airway Score: One  Labs:  CBC: Recent Labs    05/31/20 1411 06/01/20 0231 06/02/20 0425 06/04/20 0021  WBC 8.5 6.5 6.3 6.5  HGB 10.8* 9.9* 10.2* 9.5*  HCT 33.0* 30.3* 31.0* 28.2*  PLT 364 371 284 252    COAGS: Recent Labs    03/15/20 1902 05/02/20 0650 05/16/20 1413 06/04/20 0021  INR 0.9 1.0 0.9 0.9  APTT 33  --   --   --     BMP: Recent Labs    05/21/20 0601 05/31/20 1411 06/01/20 0231 06/02/20 0425  NA 135 135 136 137  K 3.8 4.0 3.5 3.9  CL 102 99 102 103  CO2 26 28 28 29   GLUCOSE 127* 111* 109* 106*  BUN 11 35* 31* 20  CALCIUM 9.3 10.5* 9.6 10.0  CREATININE 0.87 1.52* 1.19 1.09  GFRNONAA >60 49* >60 >60    LIVER FUNCTION TESTS: Recent Labs    03/15/20 1902 05/31/20 1411  BILITOT 1.4* 2.0*  AST 24 26  ALT 25 26  ALKPHOS 68 62  PROT 8.8* 7.3  ALBUMIN 5.0 4.1    Assessment/Plan:   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 Plan for image-guided cerebral arteriogram with possible revascularization (angioplasty, stent placement) of left MCA stenosis in IR with Dr. 06/02/20 tentatively for today  at 1115 pending IR scheduling.  Consent in chart. Patient is NPO. Afebrile and WBCs WNL. Ok to proceed with Plavix/Aspirin per Dr. Juventino Slovak; Lovenox held per IR protocol. P2Y12 101 PRU today. INR 0.9 today. COVID negative 05/31/2020. Further plans per TRH/neurology- appreciate and agree with management. NIR to follow.  Dr. 06/02/2020 spoke with patient's daughter, Corliss Skains, via telephone 06/03/2020 to update on today's planned procedure. All questions answered and concerns addressed.   Signed: 06/05/2020 06/04/2020, 9:11 AM

## 2020-06-04 NOTE — Sedation Documentation (Signed)
Pt intubated and under the care of anesthesia at this time. Adelina Mings, CRNA

## 2020-06-04 NOTE — Progress Notes (Signed)
STROKE TEAM PROGRESS NOTE   INTERVAL HISTORY RN is at the bedside.  He had left MCA stenting this morning with Dr. Corliss Skains, currently in neuro ICU for close monitoring. Patient awake alert, still has mostly Wernicke's aphasia.  However moving all extremities.  Eager to go home tomorrow. P2Y12 101 this am. On ASA and plavix and heparin IV.  Vitals:   06/04/20 1700 06/04/20 1730 06/04/20 1800 06/04/20 1830  BP:    (!) 113/58  Pulse: 67 61 (!) 58 63  Resp: 16 12 12  (!) 9  Temp:      TempSrc:      SpO2: 97% 99% 100% 100%  Height:       CBC:  Recent Labs  Lab 05/31/20 1411 06/01/20 0231 06/02/20 0425 06/04/20 0021  WBC 8.5   < > 6.3 6.5  NEUTROABS 6.7  --   --  4.1  HGB 10.8*   < > 10.2* 9.5*  HCT 33.0*   < > 31.0* 28.2*  MCV 99.1   < > 98.1 96.9  PLT 364   < > 284 252   < > = values in this interval not displayed.   Basic Metabolic Panel:  Recent Labs  Lab 06/01/20 0231 06/02/20 0425  NA 136 137  K 3.5 3.9  CL 102 103  CO2 28 29  GLUCOSE 109* 106*  BUN 31* 20  CREATININE 1.19 1.09  CALCIUM 9.6 10.0    Lipid Panel:  Recent Labs  Lab 06/01/20 0231  CHOL 92  TRIG 40  HDL 42  CHOLHDL 2.2  VLDL 8  LDLCALC 42    HgbA1c:  Recent Labs  Lab 06/01/20 0231  HGBA1C 5.9*   Urine Drug Screen:  Recent Labs  Lab 05/31/20 1458  LABOPIA NONE DETECTED  COCAINSCRNUR NONE DETECTED  LABBENZ NONE DETECTED  AMPHETMU NONE DETECTED  THCU NONE DETECTED  LABBARB NONE DETECTED    Alcohol Level No results for input(s): ETH in the last 168 hours.  IMAGING past 24 hours No results found.  PHYSICAL EXAM  Temp:  [97.2 F (36.2 C)-98.1 F (36.7 C)] 97.2 F (36.2 C) (05/17 1527) Pulse Rate:  [58-92] 63 (05/17 1830) Resp:  [9-22] 9 (05/17 1830) BP: (103-123)/(53-74) 113/58 (05/17 1830) SpO2:  [93 %-100 %] 100 % (05/17 1830) Arterial Line BP: (128-158)/(39-64) 149/45 (05/17 1830)  General - Well nourished, well developed, in no apparent distress.  Ophthalmologic -  fundi not visualized due to noncooperation.  Cardiovascular - Regular rhythm and rate.  Neuro - awake alert, mostly Wernicke's aphasia, spontaneous speech fluent, however, intermittent word salad, not able to answer orientation questions. Anomia but able to describe the function of an object, able to repeat, not able to read with alexia.  Not able to follow two-step commands.  Right hemianopia but improved, able to see medial half with the visual field on the right.  No gaze palsy, facial symmetrical, arm or leg motor or sensory intact.  Finger-to-nose intact bilaterally.   ASSESSMENT/PLAN [name] is a 70 y.o. 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 He presents with increasing confusion for the last couple of days prior to admission  Stroke - new left temporoccipital stroke, could be related to procedure vs. Left MCA high grade stenosis, now s/p left MCA stenting  CT head  05/31/20 large left subacute  stroke at posterior cerebral artery and left mca distributions.     MRI Acute to early subacute ischemic infarct involving the left temporoccipital region. Interval evolution of superimposed/underlying left PCA distribution infarc  MRA head and neck Chronic occlusion of the left PCA at the proximal left P2 segment. 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 Short-segment severe mid left M1 stenosis, with short-segment mild to moderate distal right M1 stenosis.  2D Echo 02/2020 LVEF 55-60%.  LDL 42  HgbA1c 5.9  VTE prophylaxis - lovenox  aspirin 81 mg daily and clopidogrel 37.5 mg daily prior to admission, now on aspirin 325 mg daily and plavix 75mg  as well as heparin IV.    Therapy recommendations:  outpt OT  Disposition:  pending  History of  stroke  02/2020 admitted for headache and word finding difficulties.  CT showed chronic left parietal/occipital hypodensity.  MRI showed acute and subacute infarct in the left PCA territory.  CTA head and neck left PCA proximal occlusion, left ICA 60% stenosis.  EF 55 to 60%.  LDL 129, A1c 6.5.  Patient discharged with aspirin 325 and Plavix 75 for 3 months as well as Lipitor 80.  05/20/2020 status post left ICA stenting. P2Y12 = 9, Plavix down to 37.5 mg daily.  On this admission P2Y12 was 135->112->101, therefore, Plavix back to 75 mg daily.  Hypertension  Home meds:  Hctz, lisinopril  BP goal 120-140 postprocedure  On Cleviprex . Long-term BP goal normotensive  Hyperlipidemia  Home meds:  lipitor 80mg ,  resumed in hospital  LDL 42, goal < 70   Continue statin at discharge  Other Stroke Risk Factors  Advanced Age >/= 58   Former Cigarette smoker   Hx stroke/TIA   Hospital day # 4  I spent  35 minutes in total face-to-face time with the patient, more than 50% of which was spent in counseling and coordination of care, reviewing test results, images and medication, and discussing the diagnosis, treatment plan and potential prognosis. This patient's care requiresreview of multiple databases, neurological assessment, discussion with family, other specialists and medical decision making of high complexity.  I discussed with Dr. and Dr. 76.  Corliss Skains, MD PhD Stroke Neurology 06/04/2020 7:07 PM     To contact Stroke Continuity provider, please refer to Marvel Plan. After hours, contact General Neurology

## 2020-06-04 NOTE — Anesthesia Procedure Notes (Signed)
Arterial Line Insertion Start/End5/17/2022 11:40 AM Performed by: Drema Pry, CRNA, CRNA  Patient location: Pre-op. Preanesthetic checklist: patient identified, IV checked, risks and benefits discussed, surgical consent, monitors and equipment checked and pre-op evaluation Lidocaine 1% used for infiltration Left, radial was placed Catheter size: 20 G Hand hygiene performed  and maximum sterile barriers used   Attempts: 2 Procedure performed without using ultrasound guided technique. Following insertion, dressing applied and Biopatch. Post procedure assessment: normal  Patient tolerated the procedure well with no immediate complications.

## 2020-06-05 ENCOUNTER — Ambulatory Visit (HOSPITAL_COMMUNITY): Payer: Medicare HMO

## 2020-06-05 ENCOUNTER — Encounter (HOSPITAL_COMMUNITY): Payer: Self-pay | Admitting: Interventional Radiology

## 2020-06-05 ENCOUNTER — Ambulatory Visit: Payer: Medicare HMO | Admitting: Cardiology

## 2020-06-05 DIAGNOSIS — I10 Essential (primary) hypertension: Secondary | ICD-10-CM

## 2020-06-05 DIAGNOSIS — I63512 Cerebral infarction due to unspecified occlusion or stenosis of left middle cerebral artery: Secondary | ICD-10-CM

## 2020-06-05 DIAGNOSIS — E119 Type 2 diabetes mellitus without complications: Secondary | ICD-10-CM

## 2020-06-05 DIAGNOSIS — R4182 Altered mental status, unspecified: Secondary | ICD-10-CM

## 2020-06-05 LAB — BASIC METABOLIC PANEL
Anion gap: 4 — ABNORMAL LOW (ref 5–15)
BUN: 13 mg/dL (ref 8–23)
CO2: 29 mmol/L (ref 22–32)
Calcium: 9.5 mg/dL (ref 8.9–10.3)
Chloride: 106 mmol/L (ref 98–111)
Creatinine, Ser: 0.83 mg/dL (ref 0.61–1.24)
GFR, Estimated: >60 mL/min (ref >60)
Glucose, Bld: 116 mg/dL — ABNORMAL HIGH (ref 70–99)
Potassium: 3.7 mmol/L (ref 3.5–5.1)
Sodium: 139 mmol/L (ref 135–145)

## 2020-06-05 LAB — CBC WITH DIFFERENTIAL/PLATELET
Abs Immature Granulocytes: 0.02 10*3/uL (ref 0.00–0.07)
Basophils Absolute: 0.1 10*3/uL (ref 0.0–0.1)
Basophils Relative: 1 %
Eosinophils Absolute: 0.2 10*3/uL (ref 0.0–0.5)
Eosinophils Relative: 2 %
HCT: 29.1 % — ABNORMAL LOW (ref 39.0–52.0)
Hemoglobin: 9.3 g/dL — ABNORMAL LOW (ref 13.0–17.0)
Immature Granulocytes: 0 %
Lymphocytes Relative: 23 %
Lymphs Abs: 1.5 10*3/uL (ref 0.7–4.0)
MCH: 32.5 pg (ref 26.0–34.0)
MCHC: 32 g/dL (ref 30.0–36.0)
MCV: 101.7 fL — ABNORMAL HIGH (ref 80.0–100.0)
Monocytes Absolute: 0.4 10*3/uL (ref 0.1–1.0)
Monocytes Relative: 6 %
Neutro Abs: 4.5 10*3/uL (ref 1.7–7.7)
Neutrophils Relative %: 68 %
Platelets: 232 10*3/uL (ref 150–400)
RBC: 2.86 MIL/uL — ABNORMAL LOW (ref 4.22–5.81)
RDW: 14.5 % (ref 11.5–15.5)
WBC: 6.6 10*3/uL (ref 4.0–10.5)
nRBC: 0 % (ref 0.0–0.2)

## 2020-06-05 NOTE — Consult Note (Signed)
[facility] Face-to-Face Psychiatry Consult   Reason for Consult:  daughter states that patient has been talking a lot about death. she wants a psyche eval. thanks Referring Physician:  Dr. Rito Ehrlich Patient Identification: KOLBI ALTADONNA MRN:  [id number] Principal Diagnosis: Stroke (cerebrum) [facility]) Diagnosis:  Principal Problem:   Stroke (cerebrum) (HCC) Active Problems:   Acute stroke due to occlusion of left posterior cerebral artery (HCC)   Essential hypertension   Controlled type 2 diabetes mellitus without complication, without long-term current use of insulin (HCC)   Middle cerebral artery stenosis, left   Total Time spent with patient: 1 hour  Subjective:   JERRIN RECORE is a 70 y.o. male patient admitted with altered mental status. Patient is seen and assessed, chart reviewed. Psych consult placed for daughter having concerns about patient talking about death. On evaluation patient states " about 4-5 months ago I didn't like I how was feeling and howe my recovery was going but I wasn't suicidal. But att his time I am healthy I am doing well. I feel fine, and Im going to be ok. " He denies any depressive symptoms at this time to include sadness, anhedonia, weight loss/gain, insomnia, suicidal thoughts, recurrent thoughts of death, hopelessness, and worthlessness. He denies any mood lability at this time. He denies any anxiety symptoms at this time. He denies any previous psychiatric history. He deneis any substance abuse, or alcohol use. Patient denies any previous psychiatric history to include depression, anxiety, mood disorder, psychosis.  He also denies any previous inpatient admission, and or outpatient psychiatric resources.  He denies any previous or current suicidal thoughts and or suicide attempt.  He denies any family history of mental illness.  He denies any substance use, and or alcohol use.  He denies any legal charges at this time.  As noted he denies any current suicidal  ideations, homicidal ideations, and or auditory visual hallucinations.  On evaluation patient is alert and oriented, calm and cooperative, very pleasant upon approach.  He is very jovial, and engages well with this nurse practitioner.  Patient denies any access to weapons, denies any alcohol and or substance abuse.  He reports some mood changes since his stroke, however he feels much better now. He states even though he had changes to his physical health (post CVA), he never felt suicidal.  He denies any previous psychiatric history, to include outpatient psychiatric services.  Patient denies any auditory and/or visual hallucinations, does not appear to be responding to internal or external stimuli.  There is no evidence of delusional thought content and patient appears to answer all questions appropriately.   Collateral from daughter Lorn Junes), he was home alone for a week. He was telling his friend he wanted to die and his friend took him to behavioral health. I suspect he has depression and anxiety and he want admit to. I am concerned about his overall wellbeing. I believe the stroke has amplified his mood. Daughter expressed much understanding about his mood changes as it relates to post stroke, she states she is trying to get him in a SNF. She is currently appealing SNF decision, and if declined again she will have to consider Home health aid. She lives 1 hour away and is unable to provide him care around the clock, and hoping he can get approved for SNF.    HPI:  MILAD BUBLITZ is an 70 y.o. male with history of prior PCA stroke and placement of ICA stent for left ICA stenosis  10 days ago was found by his friend today to be altered.  History is entirely per chart and BH H note.  Patient apparently was staying with his daughter until 5 days ago when he was picked up by his friend.  Apparently over the last couple of days patient has been increasingly confused and has had decreased p.o. intake.   Patient's friend brought him to behavioral health where he was seen by the family nurse practitioner there who called to the ED.  It is unclear when his last known normal was.  Past Psychiatric History: Denies  Risk to Self:   Denies Risk to Others:   Denies Prior Inpatient Therapy:   Denies Prior Outpatient Therapy:   Denies, on previous pysch evaluation at [facility] prior to admission.    Past Medical History:  Past Medical History:  Diagnosis Date  . Altered mental status    [name]   . HLD (hyperlipidemia)   . Hypertension   . Myocardial infarction (HCC)    1990's  . Stroke [facility]) 03/15/2020    Past Surgical History:  Procedure Laterality Date  . IR ANGIO INTRA EXTRACRAN SEL COM CAROTID INNOMINATE BILAT MOD SED  05/02/2020  . IR ANGIO VERTEBRAL SEL SUBCLAVIAN INNOMINATE UNI L MOD SED  05/02/2020  . IR ANGIO VERTEBRAL SEL SUBCLAVIAN INNOMINATE UNI R MOD SED  05/20/2020  . IR ANGIO VERTEBRAL SEL VERTEBRAL UNI R MOD SED  05/02/2020  . IR INTRAVSC STENT CERV CAROTID W/EMB-PROT MOD SED INCL ANGIO  05/20/2020  . IR RADIOLOGIST EVAL & MGMT  03/27/2020  . IR US GUIDE VASC ACCESS RIGHT  05/02/2020  . IR US GUIDE VASC ACCESS RIGHT  05/20/2020  . RADIOLOGY WITH ANESTHESIA Left 05/20/2020   Procedure: RADIOLOGY WITH ANESTHESIA  LEFT ICA STENTING;  Surgeon: Julieanne Cotton, MD;  Location: [facility] OR;  Service: Radiology;  Laterality: Left;  . RADIOLOGY WITH ANESTHESIA Left 06/04/2020   Procedure: LEFT MIDDLE CAROTID  ARTERY STENT;  Surgeon: Julieanne Cotton, MD;  Location: [facility] OR;  Service: Radiology;  Laterality: Left;   Family History:  Family History  Problem Relation Age of Onset  . Heart attack Father   . Stroke Paternal Great-grandfather    Family Psychiatric  History: Denies Social History:  Social History   Substance and Sexual Activity  Alcohol Use Yes   Comment: occasionally. Not since stroke in February     Social History   Substance  and Sexual Activity  Drug Use No    Social History   Socioeconomic History  . Marital status: Single    Spouse name: Not on file  . Number of children: Not on file  . Years of education: Not on file  . Highest education level: Not on file  Occupational History  . Not on file  Tobacco Use  . Smoking status: Former Smoker    Types: Cigarettes    Quit date: 2003    Years since quitting: 19.3  . Smokeless tobacco: Never Used  Vaping Use  . Vaping Use: Never used  Substance and Sexual Activity  . Alcohol use: Yes    Comment: occasionally. Not since stroke in February  . Drug use: No  . Sexual activity: Not on file  Other Topics Concern  . Not on file  Social History Narrative   Lives with daughter (temporary after stroke)   Right handed   Drinks 1-2 cups caffeine daily   Social Determinants of Corporate investment banker  Strain: Not on file  Food Insecurity: Not on file  Transportation Needs: Not on file  Physical Activity: Not on file  Stress: Not on file  Social Connections: Not on file   Additional Social History:    Allergies:   Allergies  Allergen Reactions  . Ciprodex [Ciprofloxacin-Dexamethasone] Swelling    Ear canal swelling  . Metformin And Related Other (See Comments)    Upset stomach    Labs:  Results for orders placed or performed during the hospital encounter of 05/31/20 (from the past 48 hour(s))  Protime-INR     Status: None   Collection Time: 06/04/20 12:21 AM  Result Value Ref Range   Prothrombin Time 12.5 11.4 - 15.2 seconds   INR 0.9 0.8 - 1.2    Comment: (NOTE) INR goal varies based on device and disease states. Performed at [facility], [address] [place], [place] [address]   CBC with Differential/Platelet     Status: Abnormal   Collection Time: 06/04/20 12:21 AM  Result Value Ref Range   WBC 6.5 4.0 - 10.5 K/uL   RBC 2.91 (L) 4.22 - 5.81 MIL/uL   Hemoglobin 9.5 (L) 13.0 - 17.0 g/dL   HCT 60.428.2 (L) 54.039.0 - 98.152.0 %    MCV 96.9 80.0 - 100.0 fL   MCH 32.6 26.0 - 34.0 pg   MCHC 33.7 30.0 - 36.0 g/dL   RDW 19.114.2 47.811.5 - 29.515.5 %   Platelets 252 150 - 400 K/uL   nRBC 0.0 0.0 - 0.2 %   Neutrophils Relative % 62 %   Neutro Abs 4.1 1.7 - 7.7 K/uL   Lymphocytes Relative 28 %   Lymphs Abs 1.8 0.7 - 4.0 K/uL   Monocytes Relative 6 %   Monocytes Absolute 0.4 0.1 - 1.0 K/uL   Eosinophils Relative 3 %   Eosinophils Absolute 0.2 0.0 - 0.5 K/uL   Basophils Relative 1 %   Basophils Absolute 0.0 0.0 - 0.1 K/uL   Immature Granulocytes 0 %   Abs Immature Granulocytes 0.02 0.00 - 0.07 K/uL    Comment: Performed at [facility], [address] [place], [place] [address]  Platelet inhibition p2y12 (Not at [facility])     Status: Abnormal   Collection Time: 06/04/20 12:21 AM  Result Value Ref Range   Platelet Function  P2Y12 101 (L) 182 - 335 PRU    Comment: (NOTE) The literature has shown a direct correlation of PRU values over 230 with higher risks of thrombotic events. Lower PRU values are associated with platelet inhibition. Performed at [facility], [address] [place], [place] [address]   Type and screen [name]     Status: None   Collection Time: 06/04/20 11:30 AM  Result Value Ref Range   ABO/RH(D) A POS    Antibody Screen NEG    Sample Expiration      06/07/2020,2359 Performed at [facility], [address] [place], [place] [address]   POCT Activated clotting time     Status: None   Collection Time: 06/04/20  1:24 PM  Result Value Ref Range   Activated Clotting Time 178 seconds  POCT Activated clotting time     Status: None   Collection Time: 06/04/20  1:46 PM  Result Value Ref Range   Activated Clotting Time 184 seconds  Glucose, capillary     Status: Abnormal   Collection Time: 06/04/20  2:34 PM  Result Value Ref  Range   Glucose-Capillary 117 (H) 70 - 99 mg/dL    Comment: Glucose reference range applies only to samples taken after fasting for at least 8  hours.  Heparin level (unfractionated)     Status: Abnormal   Collection Time: 06/04/20 10:26 PM  Result Value Ref Range   Heparin Unfractionated 0.15 (L) 0.30 - 0.70 IU/mL    Comment: (NOTE) The clinical reportable range upper limit is being lowered to >1.10 to align with the FDA approved guidance for the current laboratory assay.  If heparin results are below expected values, and patient dosage has  been confirmed, suggest follow up testing of antithrombin III levels. Performed at [facility], [address] [place], [place] [address]   CBC with Differential/Platelet     Status: Abnormal   Collection Time: 06/05/20  4:19 AM  Result Value Ref Range   WBC 6.6 4.0 - 10.5 K/uL   RBC 2.86 (L) 4.22 - 5.81 MIL/uL   Hemoglobin 9.3 (L) 13.0 - 17.0 g/dL   HCT 22.2 (L) 97.9 - 89.2 %   MCV 101.7 (H) 80.0 - 100.0 fL   MCH 32.5 26.0 - 34.0 pg   MCHC 32.0 30.0 - 36.0 g/dL   RDW 11.9 41.7 - 40.8 %   Platelets 232 150 - 400 K/uL   nRBC 0.0 0.0 - 0.2 %   Neutrophils Relative % 68 %   Neutro Abs 4.5 1.7 - 7.7 K/uL   Lymphocytes Relative 23 %   Lymphs Abs 1.5 0.7 - 4.0 K/uL   Monocytes Relative 6 %   Monocytes Absolute 0.4 0.1 - 1.0 K/uL   Eosinophils Relative 2 %   Eosinophils Absolute 0.2 0.0 - 0.5 K/uL   Basophils Relative 1 %   Basophils Absolute 0.1 0.0 - 0.1 K/uL   Immature Granulocytes 0 %   Abs Immature Granulocytes 0.02 0.00 - 0.07 K/uL    Comment: Performed at [facility], [address], [place], [place] [address]  Basic metabolic panel     Status: Abnormal   Collection Time: 06/05/20  4:19 AM  Result Value Ref Range   Sodium 139 135 - 145 mmol/L   Potassium 3.7 3.5 - 5.1 mmol/L   Chloride 106 98 - 111 mmol/L   CO2 29 22 - 32 mmol/L   Glucose, Bld 116 (H) 70 - 99 mg/dL    Comment: Glucose reference range applies only to samples taken after fasting for at least 8 hours.   BUN 13 8 - 23 mg/dL   Creatinine, Ser 8.56 0.61 - 1.24 mg/dL   Calcium 9.5 8.9 - 31.4  mg/dL   GFR, Estimated >97 >02 mL/min    Comment: (NOTE) Calculated using the CKD-EPI Creatinine Equation (2021)    Anion gap 4 (L) 5 - 15    Comment: Performed at [facility], [address] [place], [place] [address]    Current Facility-Administered Medications  Medication Dose Route Frequency Provider Last Rate Last Admin  . 0.9 %  sodium chloride infusion   Intravenous Continuous Julieanne Cotton, MD   Stopped at 06/05/20 8850  . acetaminophen (TYLENOL) tablet 650 mg  650 mg Oral Q4H PRN Pieter Partridge, MD       Or  . acetaminophen (TYLENOL) 160 MG/5ML solution 650 mg  650 mg Per Tube Q4H PRN Pieter Partridge, MD       Or  . acetaminophen (TYLENOL) suppository 650 mg  650 mg Rectal Q4H PRN  Pieter Partridge, MD      . acetaminophen (TYLENOL) tablet 650 mg  650 mg Oral Q4H PRN Julieanne Cotton, MD       Or  . acetaminophen (TYLENOL) 160 MG/5ML solution 650 mg  650 mg Per Tube Q4H PRN Deveshwar, Simonne Maffucci, MD       Or  . acetaminophen (TYLENOL) suppository 650 mg  650 mg Rectal Q4H PRN Deveshwar, Sanjeev, MD      . aspirin tablet 325 mg  325 mg Oral Daily Deveshwar, Sanjeev, MD   325 mg at 06/05/20 4782   Or  . aspirin chewable tablet 324 mg  324 mg Per Tube Daily Deveshwar, Sanjeev, MD      . atorvastatin (LIPITOR) tablet 80 mg  80 mg Oral Daily Osvaldo Shipper, MD   80 mg at 06/05/20 0924  . Chlorhexidine Gluconate Cloth 2 % PADS 6 each  6 each Topical Daily Pieter Partridge, MD   6 each at 06/04/20 1630  . clevidipine (CLEVIPREX) infusion 0.5 mg/mL  0-21 mg/hr Intravenous Continuous Julieanne Cotton, MD   Stopped at 06/05/20 9562  . clopidogrel (PLAVIX) tablet 75 mg  75 mg Oral Daily Deveshwar, Simonne Maffucci, MD   75 mg at 06/05/20 1308   Or  . clopidogrel (PLAVIX) tablet 75 mg  75 mg Per Tube Daily Deveshwar, Sanjeev, MD      . lidocaine (XYLOCAINE) 1 % (with pres) injection    PRN Julieanne Cotton, MD   10 mL at 06/04/20 1222     Musculoskeletal: Strength & Muscle Tone: within normal limits Gait & Station: normal Patient leans: N/A Psychiatric Specialty Exam:  Presentation  General Appearance: Appropriate for Environment; Casual  Eye Contact:Good  Speech:Clear and Coherent; Normal Rate  Speech Volume:Normal  Handedness:Right   Mood and Affect  Mood:Euthymic  Affect:Appropriate; Congruent   Thought Process  Thought Processes:Coherent; Goal Directed  Descriptions of Associations:Intact  Orientation:Full (Time, Place and Person)  Thought Content:WDL  History of Schizophrenia/Schizoaffective disorder:No  Duration of Psychotic Symptoms:No data recorded Hallucinations:Hallucinations: None  Ideas of Reference:None  Suicidal Thoughts:Suicidal Thoughts: No  Homicidal Thoughts:Homicidal Thoughts: No   Sensorium  Memory:Immediate Good; Remote Good; Recent Good  Judgment:Good  Insight:Good   Executive Functions  Concentration:Good  Attention Span:Good  Recall:Good  Fund of Knowledge:Good  Language:Good   Psychomotor Activity  Psychomotor Activity:Psychomotor Activity: Normal   Assets  Assets:Communication Skills; Desire for Improvement; Financial Resources/Insurance; Housing; Social Support; Physical Health; Leisure Time   Sleep  Sleep:Sleep: Fair   Physical Exam: Physical Exam ROS Blood pressure 135/70, pulse 80, temperature 98.6 F (37 C), temperature source Axillary, resp. rate 19, height  (1.778 m), SpO2 99 %. Body mass index is 27.08 kg/m.  Treatment Plan Summary: Plan Refer for neurorehab, for post CVA mood changes. Patient is currently stable at this time, in the event he devleops agitation, confusion, personality changes may consider starting low dose citalopram. Patient denies any and all psychiatric symptoms at this time. Discussed with daughter, all questions, comments and concerns were addressed. Support, encouragement and reassurance offered.    Disposition: No evidence of imminent risk to self or others at present.   Patient does not meet criteria for psychiatric inpatient admission. Supportive therapy provided about ongoing stressors. Refer to IOP. Discussed crisis plan, support from social network, calling 911, coming to the Emergency Department, and calling Suicide Hotline.  Maryagnes Amos, FNP 06/05/2020 11:46 AM

## 2020-06-05 NOTE — Progress Notes (Signed)
Occupational Therapy Treatment Patient Details Name: [name] MRN: [id number] DOB: 1950/11/06 Today's Date: 06/05/2020    History of present illness 70 yo male presenting 5/13 to ED after being found with AMS by friend. 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 PMH including PCA stroke and placement of ICA stent for left ICA stenosis on 5/2 by interventional radiology.   OT comments  Pt seen for cognition/higher level executive functioning. Pt attempting Pillbox test and did not pass. Pt very persistent about his current medication regimine at home with difficulty translating/pivoting to this novel task. Pt mobilizing well, but poor safety and awareness of deficits. Pt with no distractions when conversing/performing test and still unable to properly manage 1 step to multi-step tasks. Pt's daughter concerned for pt at home as he lives alone; from OT standpoint, pt would not be safe for ADL/iADL without 24/7 assist and therapies to continue cognitive and safety training.  Pt requires SNF level of care for safety and for cognition in conjunction with performing all functional tasks.    Follow Up Recommendations  SNF;Supervision/Assistance - 24 hour    Equipment Recommendations  None recommended by OT    Recommendations for Other Services      Precautions / Restrictions Precautions Precautions: Fall Restrictions Weight Bearing Restrictions: No       Mobility Bed Mobility Overal bed mobility: Needs Assistance             General bed mobility comments: in recliner pre and post session    Transfers Overall transfer level: Needs assistance Equipment used: None Transfers: Sit to/from Stand Sit to Stand: Supervision         General transfer comment: session's focus on cognition.    Balance Overall balance assessment: Needs assistance Sitting-balance support: Feet supported Sitting balance-Leahy Scale: Good      Standing balance support: No upper extremity supported Standing balance-Leahy Scale: Good                   Standardized Balance Assessment Standardized Balance Assessment : Dynamic Gait Index   Dynamic Gait Index Level Surface: Normal Change in Gait Speed: Moderate Impairment Gait with Horizontal Head Turns: Mild Impairment Gait with Vertical Head Turns: Mild Impairment Gait and Pivot Turn: Normal Step Over Obstacle: Moderate Impairment Step Around Obstacles: Mild Impairment Steps: Mild Impairment Total Score: 16     ADL either performed or assessed with clinical judgement   ADL Overall ADL's : Needs assistance/impaired                                     Functional mobility during ADLs: Supervision/safety General ADL Comments: Pt unable to pass Pillbox Test and required cues to switch topics and talk about something else. Pt stuck on home medication regime regardless of what OTR asking pt     Vision       Perception     Praxis      Cognition Arousal/Alertness: Awake/alert Behavior During Therapy: Impulsive Overall Cognitive Status: History of cognitive impairments - at baseline Area of Impairment: Problem solving;Awareness;Safety/judgement;Following commands;Memory;Attention;Orientation                 Orientation Level: Situation;Time;Place;Person;Disoriented to Current Attention Level: Selective Memory: Decreased recall of precautions;Decreased short-term memory Following Commands: Follows one step commands inconsistently;Follows one step commands with increased time Safety/Judgement: Decreased awareness of safety;Decreased awareness of deficits Awareness:  Anticipatory Problem Solving: Requires verbal cues;Requires tactile cues;Difficulty sequencing;Slow processing General Comments: Pt attempting Pillbox test and did not pass. Pt very persistent about his current medication regimine at home with difficulty translating/pivoting to  this novel task. Pt having difficulty with immediate recall requiring each item's directions to be repeated and cues required to complete task. Pt took >5 mins and made >3 errors, thus not passing. Pt with expressive deficits and unable to move onto a new subject.        Exercises     Shoulder Instructions       General Comments Pt's daughter concerned for pt at home as he lives alone; from OT standpoint, pt would not be safe for ADL/iADL without 24/7 assist.  Pt requires SNF level of care for safety and for cognition in conjunction with performing all functional tasks.    Pertinent Vitals/ Pain       Pain Assessment: No/denies pain  Home Living                                          Prior Functioning/Environment              Frequency  Min 2X/week        Progress Toward Goals  OT Goals(current goals can now be found in the care plan section)  Progress towards OT goals: Progressing toward goals  Acute Rehab OT Goals Patient Stated Goal: to go home OT Goal Formulation: With patient Time For Goal Achievement: 06/15/20 Potential to Achieve Goals: Good ADL Goals Pt Will Perform Grooming: with modified independence;standing Pt Will Transfer to Toilet: with modified independence;ambulating;regular height toilet Additional ADL Goal #1: Pt will perform four part path finding task with Min cues Additional ADL Goal #2: Pt will perform IADL task with 2-3 cues  Plan Discharge plan needs to be updated    Co-evaluation                 AM-PAC OT "6 Clicks" Daily Activity     Outcome Measure   Help from another person eating meals?: None Help from another person taking care of personal grooming?: None Help from another person toileting, which includes using toliet, bedpan, or urinal?: A Little Help from another person bathing (including washing, rinsing, drying)?: A Little Help from another person to put on and taking off regular upper body  clothing?: None Help from another person to put on and taking off regular lower body clothing?: A Little 6 Click Score: 21    End of Session    OT Visit Diagnosis: Other symptoms and signs involving cognitive function   Activity Tolerance Patient tolerated treatment well   Patient Left in chair;with call bell/phone within reach   Nurse Communication Mobility status        Time: 0093-8182 OT Time Calculation (min): 28 min  Charges: OT General Charges $OT Visit: 1 Visit OT Treatments $Self Care/Home Management : 8-22 mins $Cognitive Funtion inital: Initial 15 mins  Flora Lipps, OTR/L Acute Rehabilitation Services Pager: [phone] Office: [phone]    Jourdyn Ferrin C 06/05/2020, 5:04 PM

## 2020-06-05 NOTE — Anesthesia Postprocedure Evaluation (Signed)
Anesthesia Post Note  Patient: [name]  Procedure(s) Performed: LEFT MIDDLE CAROTID  ARTERY STENT (Left )     Patient location during evaluation: PACU Anesthesia Type: General Level of consciousness: awake and alert Pain management: pain level controlled Vital Signs Assessment: post-procedure vital signs reviewed and stable Respiratory status: spontaneous breathing, nonlabored ventilation, respiratory function stable and patient connected to nasal cannula oxygen Cardiovascular status: blood pressure returned to baseline and stable Postop Assessment: no apparent nausea or vomiting Anesthetic complications: no   No complications documented.  Last Vitals:  Vitals:   06/05/20 0730 06/05/20 0800  BP: (!) 104/59 101/62  Pulse: 62 70  Resp: 11 19  Temp:    SpO2: 95% 99%    Last Pain:  Vitals:   06/05/20 0600  TempSrc:   PainSc: Asleep                 Daisean Brodhead S

## 2020-06-05 NOTE — Progress Notes (Addendum)
Referring Physician(s): Marvel Plan (neurology)  Supervising Physician: Julieanne Cotton  Patient Status:  [facility] - In-pt  Chief Complaint:  History of acute/early subacute CVA (left temporoccipital region) in setting of left MCA stenosis s/p cerebral arteriogram with revascularization of left MCA stenosis using stent assisted angioplasty via right radial approach 06/04/2020 by Dr. Corliss Skains.  Subjective:  Patient awake and alert sitting in bed watching TV. Asking for breakfast. Demonstrates intermittent confusion and intermittently follows simple commands. Speech clear but has word finding/identifying difficultly. Moving all extremities. Right radial puncture site c/d/i.   Allergies: Ciprodex [ciprofloxacin-dexamethasone] and Metformin and related  Medications: Prior to Admission medications   Medication Sig Start Date End Date Taking? Authorizing Provider  acetaminophen (TYLENOL) 500 MG tablet Take 1,000 mg by mouth every 6 (six) hours as needed for moderate pain or headache.   Yes [provider]  aspirin 81 MG chewable tablet Chew 1 tablet (81 mg total) by mouth daily. 05/22/20  Yes Hortence Charter M, PA-C  atorvastatin (LIPITOR) 80 MG tablet Take 1 tablet (80 mg total) by mouth daily. 03/17/20  Yes Azucena Fallen, MD  b complex vitamins capsule Take 1 capsule by mouth daily.   Yes [provider]  clopidogrel (PLAVIX) 75 MG tablet Take 0.5 tablets (37.5 mg total) by mouth daily. 05/21/20 08/19/20 Yes Lyvia Mondesir M, PA-C  hydrochlorothiazide (HYDRODIURIL) 25 MG tablet Take 1 tablet (25 mg total) by mouth daily. 03/18/20  Yes Pahwani, Daleen Bo, MD  lisinopril (ZESTRIL) 40 MG tablet Take 40 mg by mouth daily.   Yes [provider]  potassium chloride SA (KLOR-CON) 20 MEQ tablet Take 1 tablet (20 mEq total) by mouth daily. 03/22/20  Yes Azucena Fallen, MD  traZODone (DESYREL) 50 MG tablet Take 50 mg by mouth at bedtime as needed for sleep. 04/28/20   Yes [provider]  VITAMIN D-VITAMIN K PO Take 1 capsule by mouth daily.   Yes [provider]     Vital Signs: BP (!) 102/56   Pulse 61   Temp 98.6 F (37 C) (Axillary)   Resp 13   Ht 5\' 10"  (1.778 m)   SpO2 96%   BMI 27.08 kg/m   Physical Exam Vitals and nursing note reviewed.  Constitutional:      General: He is not in acute distress. Pulmonary:     Effort: Pulmonary effort is normal. No respiratory distress.  Skin:    General: Skin is warm and dry.     Comments: Right radial puncture site soft without active bleeding or hematoma.  Neurological:     Mental Status: He is alert.     Comments: Alert and awake. Intermittent confusion, intermittently follows simple commands. Speech clear but has word finding/identifying difficultly. PERRL bilaterally. Moving all extremities. No pronator drift. Distal pulses (radial) palpable bilaterally.     Imaging: VAS CAROTID  Result Date: 06/05/2020 Carotid Arterial Duplex Study Patient Name:  [name]  Date of Exam:   06/01/2020 Medical Rec #: 06/03/2020         Accession #:    [id number] Date of Birth: 12-Oct-1950        Patient Gender: M Patient Age:   69Y Exam Location:  [facility] Procedure:      VAS [facility] CAROTID Referring Phys: [place] [facility] --------------------------------------------------------------------------------  Indications:       CVA (left PCA/MCA stroke) and confusion. MRA of neck shows  normal right ICA and abrupt signal loss at the left ICA bulb                    through the proximal ICA. Risk Factors:      Hypertension, past history of smoking, prior MI, coronary                    artery disease. Other Factors:     Status post angio and left carotid stenting 10 days ago. Comparison Study:  No prior study Performing Technologist: Sherren Kerns RVS  Examination Guidelines: A complete evaluation includes B-mode imaging, spectral Doppler, color Doppler, and  power Doppler as needed of all accessible portions of each vessel. Bilateral testing is considered an integral part of a complete examination. Limited examinations for reoccurring indications may be performed as noted.  Right Carotid Findings: +----------+--------+--------+--------+--------------------------+---------+           PSV cm/sEDV cm/sStenosisPlaque Description        Comments  +----------+--------+--------+--------+--------------------------+---------+ CCA Prox  108     20                                                  +----------+--------+--------+--------+--------------------------+---------+ CCA Distal121     22                                                  +----------+--------+--------+--------+--------------------------+---------+ ICA Prox  134     44      40-59%  heterogenous and irregularShadowing +----------+--------+--------+--------+--------------------------+---------+ ICA Mid   92      29                                                  +----------+--------+--------+--------+--------------------------+---------+ ICA Distal124     35                                                  +----------+--------+--------+--------+--------------------------+---------+ ECA       159     24                                                  +----------+--------+--------+--------+--------------------------+---------+ +----------+--------+-------+--------+-------------------+           PSV cm/sEDV cmsDescribeArm Pressure (mmHG) +----------+--------+-------+--------+-------------------+ [id number]                                         +----------+--------+-------+--------+-------------------+ +---------+--------+--+--------+--+ VertebralPSV cm/s50EDV cm/s19 +---------+--------+--+--------+--+  Left Carotid Findings: +----------+--------+--------+--------+----------------------+--------+           PSV cm/sEDV cm/sStenosisPlaque  Description    Comments +----------+--------+--------+--------+----------------------+--------+ CCA Prox  78      14  heterogenous                   +----------+--------+--------+--------+----------------------+--------+ CCA Distal75      18              heterogenous                   +----------+--------+--------+--------+----------------------+--------+ ICA Prox  68      18              calcific and irregularstent    +----------+--------+--------+--------+----------------------+--------+ ICA Mid   82      22                                             +----------+--------+--------+--------+----------------------+--------+ ICA Distal86      29                                             +----------+--------+--------+--------+----------------------+--------+ ECA       114     12                                             +----------+--------+--------+--------+----------------------+--------+ +----------+--------+--------+--------+-------------------+           PSV cm/sEDV cm/sDescribeArm Pressure (mmHG) +----------+--------+--------+--------+-------------------+ Subclavian127                                         +----------+--------+--------+--------+-------------------+ +---------+--------+--+--------+-+ VertebralPSV cm/s30EDV cm/s5 +---------+--------+--+--------+-+  Left Stent(s): +---------------+--+--+-------------++---------------+ Prox to Stent  9315<50% stenosis                +---------------+--+--+-------------++---------------+ Proximal Stent 6818<50% stenosis                +---------------+--+--+-------------++---------------+ Mid Stent      8222<50% stenosisShadowing noted +---------------+--+--+-------------++---------------+ Distal Stent   8527<50% stenosis                +---------------+--+--+-------------++---------------+ Distal to Stent8629<50% stenosis                 +---------------+--+--+-------------++---------------+ Shadowing noted proximal to mid stent. Normal waveforms noted pre and post shadowing.   Summary: Right Carotid: Velocities in the right ICA are consistent with a 40-59%                stenosis. Left Carotid: Velocities in the left ICA are consistent with a 1-39% stenosis.               <50% stenosis noted in left carotid, post stenting. Shadowing               noted proximal to mid stent. Normal waveforms noted pre and post               shadowing. Vertebrals:  Bilateral vertebral arteries demonstrate antegrade flow. Subclavians: Normal flow hemodynamics were seen in bilateral subclavian              arteries. *See table(s) above for measurements and observations.   Electronically signed by Delia Heady MD on 06/05/2020 at 8:16:44 AM.    Final  Labs:  CBC: Recent Labs    06/01/20 0231 06/02/20 0425 06/04/20 0021 06/05/20 0419  WBC 6.5 6.3 6.5 6.6  HGB 9.9* 10.2* 9.5* 9.3*  HCT 30.3* 31.0* 28.2* 29.1*  PLT 371 284 252 232    COAGS: Recent Labs    03/15/20 1902 05/02/20 0650 05/16/20 1413 06/04/20 0021  INR 0.9 1.0 0.9 0.9  APTT 33  --   --   --     BMP: Recent Labs    05/31/20 1411 06/01/20 0231 06/02/20 0425 06/05/20 0419  NA 135 136 137 139  K 4.0 3.5 3.9 3.7  CL 99 102 103 106  CO2 28 28 29 29   GLUCOSE 111* 109* 106* 116*  BUN 35* 31* 20 13  CALCIUM 10.5* 9.6 10.0 9.5  CREATININE 1.52* 1.19 1.09 0.83  GFRNONAA 49* >60 >60 >60    LIVER FUNCTION TESTS: Recent Labs    03/15/20 1902 05/31/20 1411  BILITOT 1.4* 2.0*  AST 24 26  ALT 25 26  ALKPHOS 68 62  PROT 8.8* 7.3  ALBUMIN 5.0 4.1    Assessment and Plan:  History of acute/early subacute CVA (left temporoccipital region) in setting of left MCA stenosis s/p cerebral arteriogram with revascularization of left MCA stenosis using stent assisted angioplasty via right radial approach 06/04/2020 by Dr. Corliss Skainseveshwar. Patient with intermittent confusion and  intermittently follows simple commands, speech clear but has word finding/identifying difficultly, moving all extremities- all unchanged from prior to procedure. Right radial puncture site stable, distal pulses (radial) palpable bilaterally. Continue taking Plavix 75 mg once daily and Aspirin 325 mg once daily. Continue with PT/OT/SLP. From NIR standpoint, ok to transfer to floor/discharge when medically appropriate. Plan to follow-up with Dr. Corliss Skainseveshwar in clinic 2-3 weeks after discharge (NIR schedulers to call patient to set up this appointment). Further plans per TRH/neurology- appreciate and agree with management. Please call NIR with questions/concerns.   Electronically Signed: Elwin MochaAlexandra Jenayah Antu, PA-C 06/05/2020, 9:26 AM   I spent a total of 15 Minutes at the the patient's bedside AND on the patient's hospital floor or unit, greater than 50% of which was counseling/coordinating care for left MCA stenosis s/p revascularization.

## 2020-06-05 NOTE — Progress Notes (Signed)
Physical Therapy Treatment & Discharge Patient Details Name: [name] MRN: [id number] DOB: May 16, 1950 Today's Date: 06/05/2020    History of Present Illness 70 yo male presenting 5/13 to ED after being found with AMS by friend. 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 PMH including PCA stroke and placement of ICA stent for left ICA stenosis on 5/2 by interventional radiology.    PT Comments    Pt demonstrates some mild balance deficits, but appropriate changes in gait to accommodate for difficulty with certain challenges to maintain his balance. Even though he scored a 16 on his DGI test this date, he does not appear to be at high risk for falls and may have scored low on the test primarily due to his impaired cognition impacting his ability to follow directions and understand cues provided to him. Pt with difficulty maintaining balance in narrow stance positions primarily, particularly SLS or tandem stance. Pt's daughter is hopeful that pt can get therapy services in the SNF setting prior to return home, and he could greatly benefit from therapy by other disciplines to address his deficits in speech and cognition to improve his safety and independence. He is not safe to be home alone at this time. PT will sign off at this time as his functional mobility is good and his primary deficits are his cognition and speech. All education completed and questions answered.   Follow Up Recommendations  Supervision/Assistance - 24 hour;No PT follow up     Equipment Recommendations  None recommended by PT    Recommendations for Other Services       Precautions / Restrictions Precautions Precautions: Fall Restrictions Weight Bearing Restrictions: No    Mobility  Bed Mobility Overal bed mobility: Needs Assistance             General bed mobility comments: In recliner    Transfers Overall transfer level: Needs  assistance Equipment used: None Transfers: Sit to/from Stand Sit to Stand: Supervision         General transfer comment: Supervision for safety, no overt LOB.  Ambulation/Gait Ambulation/Gait assistance: Supervision Gait Distance (Feet): 700 Feet Assistive device: None Gait Pattern/deviations: Step-through pattern;Narrow base of support Gait velocity: reduced and variable Gait velocity interpretation: 1.31 - 2.62 ft/sec, indicative of limited community ambulator General Gait Details: Pt unable to demonstrate balance skills with narrow stances, like tandem and SLS, but able to maintain static semi-tandem and Rhomberg stances with eyes open and eyes closed without LOB but with noted trunk sway. Pt able to appropriately respond to DGI challenges without overt LOB, supervision for safety.   Stairs Stairs: Yes Stairs assistance: Min guard Stair Management: One rail Right;Alternating pattern Number of Stairs: 10 General stair comments: Ascends and descends with R rail and reciprocal pattern, no overt LOB. Supervision for safety.   Wheelchair Mobility    Modified Rankin (Stroke Patients Only) Modified Rankin (Stroke Patients Only) Pre-Morbid Rankin Score: No significant disability Modified Rankin: Moderate disability     Balance Overall balance assessment: Needs assistance (pt is unable to balance with highest level berg balance skills) Sitting-balance support: Feet supported Sitting balance-Leahy Scale: Good     Standing balance support: No upper extremity supported Standing balance-Leahy Scale: Good                   Standardized Balance Assessment Standardized Balance Assessment : Dynamic Gait Index   Dynamic Gait Index Level Surface: Normal Change in Gait Speed: Moderate Impairment  Gait with Horizontal Head Turns: Mild Impairment Gait with Vertical Head Turns: Mild Impairment Gait and Pivot Turn: Normal Step Over Obstacle: Moderate Impairment Step Around  Obstacles: Mild Impairment Steps: Mild Impairment Total Score: 16      Cognition Arousal/Alertness: Awake/alert Behavior During Therapy: Impulsive Overall Cognitive Status: History of cognitive impairments - at baseline Area of Impairment: Problem solving;Awareness;Safety/judgement;Following commands;Memory;Attention;Orientation                 Orientation Level: Situation;Time;Place;Person;Disoriented to Current Attention Level: Selective Memory: Decreased recall of precautions;Decreased short-term memory Following Commands: Follows one step commands inconsistently;Follows one step commands with increased time Safety/Judgement: Decreased awareness of safety;Decreased awareness of deficits Awareness: Anticipatory Problem Solving: Requires verbal cues;Requires tactile cues;Difficulty sequencing;Slow processing General Comments: Pt with expressive deficits, stating "when I was 70 years old" and "I will be 76" soon and then reporting his birthday was "[birth year]". Pt unable to identify exact hospital but able to pick hospital from a list. Pt easily distracted and needing repeated cues and extra time to process info, following single step commands incosistently and inability to follow more than 1-step commands. Pt needing cues to path find his way back to his room, demonstrating poor memory and use of visual cues. Difficulty with multi-tasking, like rotating head while ambulating.      Exercises      General Comments General comments (skin integrity, edema, etc.): Pt with difficulty following directions of DGI      Pertinent Vitals/Pain Pain Assessment: No/denies pain    Home Living                      Prior Function            PT Goals (current goals can now be found in the care plan section) Acute Rehab PT Goals Patient Stated Goal: to go home PT Goal Formulation: With patient Time For Goal Achievement: 06/15/20 Potential to Achieve Goals: Good Progress towards  PT goals: Progressing toward goals    Frequency    Min 4X/week      PT Plan Current plan remains appropriate    Co-evaluation              AM-PAC PT "6 Clicks" Mobility   Outcome Measure  Help needed turning from your back to your side while in a flat bed without using bedrails?: None Help needed moving from lying on your back to sitting on the side of a flat bed without using bedrails?: None Help needed moving to and from a bed to a chair (including a wheelchair)?: None Help needed standing up from a chair using your arms (e.g., wheelchair or bedside chair)?: None Help needed to walk in hospital room?: A Little Help needed climbing 3-5 steps with a railing? : A Little 6 Click Score: 22    End of Session Equipment Utilized During Treatment: Gait belt Activity Tolerance: Patient tolerated treatment well Patient left: with call bell/phone within reach;in chair Nurse Communication: Mobility status;Other (comment) (aphasia) PT Visit Diagnosis: Other symptoms and signs involving the nervous system (R29.898);Unsteadiness on feet (R26.81)     Time: 7510-2585 PT Time Calculation (min) (ACUTE ONLY): 24 min  Charges:  $Gait Training: 23-37 mins                     Raymond Gurney, PT, DPT Acute Rehabilitation Services  Pager: [phone] Office: [phone]    Jewel Baize 06/05/2020, 3:49 PM

## 2020-06-05 NOTE — Progress Notes (Signed)
[facility] Progress Note  Patient: [name]    UKG:[id number]  DOA: 05/31/2020     Date of Service: the patient was seen and examined on 06/05/2020  Brief hospital course: Past medical history of stroke, HTN, anemia, PVD.  Presents with complaints of confusion, expressive aphasia as well as hemianopsia. Neurology was consulted. IR was consulted and underwent MCA stenting.  Currently plan is arranging safe discharge.  Assessment and Plan: Acute stroke MRI showed an acute to early subacute ischemic infarct involving the left temporal occipital region.  Patient has had multiple strokes in the last few months.  He underwent left ICA stent placement on 5/2 which was done by interventional radiology.   Patient was supposed to be on aspirin and Plavix which is being continued here.   LDL 42.  HbA1c 5.9.  Patient noted to be on statin at home. PT OT SLP evaluation is ongoing.  From a physical functioning standpoint he seems to be doing well but due to the significant aphasia is thought to require 24/7 supervision.  He does not have supervision at home.  He lives by himself.  This will be an unsafe situation for him.  Would benefit from going to skilled nursing facility for rehab. Neurology continues to follow.    Underwent cerebral angiogram by interventional radiology for MCA intervention.  Carotid artery stenosis Underwent stent placement to left ICA on 5/2, by interventional radiology.  To undergo cerebral angiogram today.  Wernicke's aphasia Patient's inability to answer questions most likely is due to his significant aphasia.  There is also significant receptive aphasia as per speech therapy.  This could potentially be a safety issue especially as patient apparently lives by himself.  As per SLP patient will need 24-hour supervision.  Hypotension in the setting of known history of hypertension His antihypertensives including HCTZ and lisinopril were held.   He was given  fluid boluses and started on IV fluids.  Blood pressures have improved.  We will stop IV fluids. Hypotension thought to be due to hypovolemia.  Acute kidney injury Came in with a creatinine of 1.5.  Baseline appears to be 0.87.  Baseline around 0.8.  Per history he has had poor oral intake.  Could have been dehydrated.   He was given IV fluids with improvement in renal function.  Monitor urine output.    Normocytic anemia Hemoglobin was 13.1 in April.  Over the last 2 weeks it has been in the 9-10 range.  No overt bleeding noted.   Anemia panel shows ferritin 446, iron 109, TIBC 337, folate 28.8, vitamin B12 688.   Further evaluation in the outpatient setting.  Diabetes mellitus type 2, diet controlled HbA1c 5.9.  Not noted to be on any glucose lowering agents at home.  No need to place him on sliding scale coverage at this time.  Concern for depression Daughter raised concern that patient may be depressed.  Apparently he has been talking about death to his friends and family.  Unable to assess suicidal ideation at this time.   Psychiatry consultation appreciated.  Currently no medication.  No active suicidal ideation.  Outpatient follow-up with psychiatry.  Diet: Regular diet DVT Prophylaxis: Subcutaneous Lovenox   Advance goals of care discussion: Full code  Family Communication: no family was present at bedside, at the time of interview.   Disposition:  Status is: Inpatient  Remains inpatient appropriate because:Unsafe d/c plan   Dispo:  Patient From: Home  Planned Disposition: Skilled Nursing Facility  Medically stable for discharge: No     Subjective: No nausea no vomiting.  No fever no chills.  No dizziness.  Reports his vision is improving.  Reports his speech is improving.  Physical Exam:  General: Appear in mild distress, no Rash; Oral Mucosa Clear, moist. no Abnormal Neck Mass Or lumps, Conjunctiva normal  Cardiovascular: S1 and S2 Present, no  Murmur, Respiratory: good respiratory effort, Bilateral Air entry present and CTA, no Crackles, no wheezes Abdomen: Bowel Sound present, Soft and no tenderness Extremities: no Pedal edema Neurology: alert and oriented to time, place, and person affect appropriate.  Expressive aphasia, no other new focal deficit Gait not checked due to patient safety concerns  Vitals:   06/05/20 1300 06/05/20 1400 06/05/20 1600 06/05/20 1639  BP: (!) 105/58 103/80  (!) 131/55  Pulse: (!) 59 76    Resp: 13 18 13 14   Temp:   98.7 F (37.1 C)   TempSrc:   Axillary   SpO2: 93% 92%    Height:        Intake/Output Summary (Last 24 hours) at 06/05/2020 1916 Last data filed at 06/05/2020 1400 Gross per 24 hour  Intake 1624.09 ml  Output 2300 ml  Net -675.91 ml   There were no vitals filed for this visit.  Data Reviewed: I have personally reviewed and interpreted daily labs, tele strips, imaging. I reviewed all nursing notes, pharmacy notes, vitals, pertinent old records I have discussed plan of care as described above with RN and patient/family.  CBC: Recent Labs  Lab 05/31/20 1411 06/01/20 0231 06/02/20 0425 06/04/20 0021 06/05/20 0419  WBC 8.5 6.5 6.3 6.5 6.6  NEUTROABS 6.7  --   --  4.1 4.5  HGB 10.8* 9.9* 10.2* 9.5* 9.3*  HCT 33.0* 30.3* 31.0* 28.2* 29.1*  MCV 99.1 97.4 98.1 96.9 101.7*  PLT 364 371 284 252 232   Basic Metabolic Panel: Recent Labs  Lab 05/31/20 1411 06/01/20 0231 06/02/20 0425 06/05/20 0419  NA 135 136 137 139  K 4.0 3.5 3.9 3.7  CL 99 102 103 106  CO2 28 28 29 29   GLUCOSE 111* 109* 106* 116*  BUN 35* 31* 20 13  CREATININE 1.52* 1.19 1.09 0.83  CALCIUM 10.5* 9.6 10.0 9.5    Studies: No results found.  Scheduled Meds: . aspirin  325 mg Oral Daily   Or  . aspirin  324 mg Per Tube Daily  . atorvastatin  80 mg Oral Daily  . Chlorhexidine Gluconate Cloth  6 each Topical Daily  . clopidogrel  75 mg Oral Daily   Or  . clopidogrel  75 mg Per Tube Daily    Continuous Infusions: . sodium chloride Stopped (06/05/20 0903)   PRN Meds: acetaminophen **OR** acetaminophen (TYLENOL) oral liquid 160 mg/5 mL **OR** acetaminophen, acetaminophen **OR** acetaminophen (TYLENOL) oral liquid 160 mg/5 mL **OR** acetaminophen, lidocaine  Time spent: 35 minutes  Author: , MD [facility] 06/05/2020 7:16 PM  To reach On-call, see care teams to locate the attending and reach out via [email or web address]. Between 7PM-7AM, please contact night-coverage If you still have difficulty reaching the attending provider, please page the [facility] (Director on Call) for [facility] on amion for assistance.

## 2020-06-05 NOTE — Progress Notes (Signed)
STROKE TEAM PROGRESS NOTE   INTERVAL HISTORY RN is at the bedside.  Pt lying in bed, no neuro changes, still has anomia, alexia with agraphia, right hemianopia. On ASA and plavix, stopped heparin IV in am. BP stable, A line is out, off cleviprex.   Vitals:   06/05/20 0800 06/05/20 0830 06/05/20 0900 06/05/20 0930  BP: 101/62 (!) 102/56 135/70   Pulse: 70 61 96 80  Resp: 19 13 (!) 21 19  Temp: 98.6 F (37 C)     TempSrc: Axillary     SpO2: 99% 96% 93% 99%  Height:       CBC:  Recent Labs  Lab 06/04/20 0021 06/05/20 0419  WBC 6.5 6.6  NEUTROABS 4.1 4.5  HGB 9.5* 9.3*  HCT 28.2* 29.1*  MCV 96.9 101.7*  PLT 252 232   Basic Metabolic Panel:  Recent Labs  Lab 06/02/20 0425 06/05/20 0419  NA 137 139  K 3.9 3.7  CL 103 106  CO2 29 29  GLUCOSE 106* 116*  BUN 20 13  CREATININE 1.09 0.83  CALCIUM 10.0 9.5    Lipid Panel:  Recent Labs  Lab 06/01/20 0231  CHOL 92  TRIG 40  HDL 42  CHOLHDL 2.2  VLDL 8  LDLCALC 42    HgbA1c:  Recent Labs  Lab 06/01/20 0231  HGBA1C 5.9*   Urine Drug Screen:  Recent Labs  Lab 05/31/20 1458  LABOPIA NONE DETECTED  COCAINSCRNUR NONE DETECTED  LABBENZ NONE DETECTED  AMPHETMU NONE DETECTED  THCU NONE DETECTED  LABBARB NONE DETECTED    Alcohol Level No results for input(s): ETH in the last 168 hours.  IMAGING past 24 hours No results found.  PHYSICAL EXAM  Temp:  [97.2 F (36.2 C)-98.6 F (37 C)] 98.6 F (37 C) (05/18 0800) Pulse Rate:  [54-96] 80 (05/18 0930) Resp:  [8-27] 19 (05/18 0930) BP: (86-135)/(46-74) 135/70 (05/18 0900) SpO2:  [90 %-100 %] 99 % (05/18 0930) Arterial Line BP: (101-169)/(35-85) 127/42 (05/18 0830)  General - Well nourished, well developed, in no apparent distress.  Ophthalmologic - fundi not visualized due to noncooperation.  Cardiovascular - Regular rhythm and rate.  Neuro - awake alert, has spontaneous speech with intermittent word salad, followed limited simple commands, able to mimic,  not able to answer orientation questions. Anomia but able to describe the function of an object, not able to repeat, alexia with agraphia but only able to write down his name.  Not able to follow two-step commands.  Right hemianopia but improved, able to see medial half with the visual field on the right.  No gaze palsy, facial symmetrical, arm or leg motor or sensory intact.  Finger-to-nose intact bilaterally.   ASSESSMENT/PLAN [name] is a 70 y.o. 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 He presents with increasing confusion for the last couple of days prior to admission  Stroke - new left temporoccipital stroke, could be related to procedure vs. Left MCA high grade stenosis, now s/p left MCA stenting  CT head  05/31/20 large left subacute stroke at posterior cerebral artery and left mca distributions.     MRI Acute to early subacute ischemic infarct involving the left temporoccipital region. 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 Short-segment severe mid left M1 stenosis, with short-segment mild to moderate distal right M1 stenosis.  IR s/p left MCA stenting  2D Echo 02/2020 LVEF 55-60%.  LDL 42  HgbA1c 5.9  VTE prophylaxis - lovenox  aspirin 81 mg daily and clopidogrel 37.5 mg daily prior to admission, now on aspirin 325 mg daily and plavix 75mg  for at least 3 months. Off heparin IV.    Therapy recommendations:  outpt OT  Disposition:  pending  History of stroke  02/2020 admitted for headache and word finding difficulties.  CT showed chronic left parietal/occipital  hypodensity.  MRI showed acute and subacute infarct in the left PCA territory.  CTA head and neck left PCA proximal occlusion, left ICA 60% stenosis.  EF 55 to 60%.  LDL 129, A1c 6.5.  Patient discharged with aspirin 325 and Plavix 75 for 3 months as well as Lipitor 80.  05/20/2020 status post left ICA stenting. P2Y12 = 9, Plavix down to 37.5 mg daily.  On this admission P2Y12 was 135->112->101, therefore, Plavix back to 75 mg daily.  Hypertension  Home meds:  Hctz, lisinopril  Off Cleviprex . Long-term BP goal normotensive  Hyperlipidemia  Home meds:  lipitor 80mg ,  resumed in hospital  LDL 42, goal < 70   Continue statin at discharge  Other Stroke Risk Factors  Advanced Age >/= 70   Former Cigarette smoker   Hospital day # 5  Neurology will sign off. Please call with questions. Pt will follow up with NP Slack on 6/16 at [facility]. Thanks for the consult.  7/16, MD PhD Stroke Neurology 06/05/2020 10:51 AM     To contact Stroke Continuity provider, please refer to [facility] Plan. After hours, contact General Neurology

## 2020-06-05 NOTE — Discharge Instructions (Signed)
Radial Site Care This sheet gives you information about how to care for yourself after your procedure. Your health care provider may also give you more specific instructions. If you have problems or questions, contact your health care provider. What can I expect after the procedure? After the procedure, it is common to have:  Bruising that usually fades within 1-2 weeks.  Tenderness at the site. Follow these instructions at home: Wound care 1. Follow instructions from your health care provider about how to take care of your insertion site. Make sure you: ? Wash your hands with soap and water before you change your bandage (dressing). If soap and water are not available, use hand sanitizer. ? Change your dressing as directed- pressure dressing removed 24 hours post-procedure (and switch for bandaid), bandaid removed 72 hours post-procedure 2. Do not take baths, swim, or use a hot tub for 7 days post-procedure. 3. You may shower 48 hours after the procedure or as told by your health care provider. ? Gently wash the site with plain soap and water. ? Pat the area dry with a clean towel. ? Do not rub the site. This may cause bleeding. 4. Check your site every day for signs of infection. Check for: ? Redness, swelling, or pain. ? Fluid or blood. ? Warmth. ? Pus or a bad smell. Activity  Do not stoop, bend, or lift anything that is heavier than 10 lb (4.5 kg) for 2 weeks post-procedure.  Do not drive self for 2 weeks post-procedure. Contact a health care provider if you have:  A fever or chills.  You have redness, swelling, or pain around your insertion site. Get help right away if:  The catheter insertion area swells very fast.  You pass out.  You suddenly start to sweat or your skin gets clammy.  The catheter insertion area is bleeding, and the bleeding does not stop when you hold steady pressure on the area.  The area near or just beyond the catheter insertion site becomes  pale, cool, tingly, or numb. These symptoms may represent a serious problem that is an emergency. Do not wait to see if the symptoms will go away. Get medical help right away. Call your local emergency services (911 in the [place] Do not drive yourself to the hospital.  This information is not intended to replace advice given to you by your health care provider. Make sure you discuss any questions you have with your health care provider. Document Revised: 01/18/2017 Document Reviewed: 01/18/2017 Elsevier Patient Education  2020 [employer]  

## 2020-06-05 NOTE — Progress Notes (Signed)
  Speech Language Pathology Treatment: Cognitive-Linquistic  Patient Details Name: SAMEER TEEPLE MRN: [id number] DOB: 03/01/1950 Today's Date: 06/05/2020 Time: 0321-2248 SLP Time Calculation (min) (ACUTE ONLY): 20 min  Assessment / Plan / Recommendation Clinical Impression  In comparison to previous session, his language has mild-moderately improved. He will continue to require 24 hour supervision at discharge from a safety standpoint for all ADL's as he has difficulty comprehending entirety of message which could have significant effects. Expression in general conversation comprised of accurate thoughts and sentences mixed with semantic and phonemic paraphasias without adequate awareness. Reading comprehension to choose appropriate word from field of 3-4 with 30% accuracy. Repetition of words is 70% and written cues only intermittently helpful. Therapy will continue on acute and pt would benefit from continued therapy at skilled nursing facility as he is not safe at home without assist.     HPI HPI: 70 y.o. male admitted with new CVA.  PMHx prior left PCA stoke, placement of ICA stent for left ICA stenosis 5/2. Baseline expressive aphasia. MRI: late subacute to chronic appearance of left PCA distribution infarct; acute-appearing abnormality left temporal-occipital region. Patchy involvement left basal ganglia.      SLP Plan  Continue with current plan of care       Recommendations                   Oral Care Recommendations: Oral care BID Follow up Recommendations: Outpatient SLP SLP Visit Diagnosis: Aphasia (R47.01) Plan: Continue with current plan of care       GO                Royce Macadamia 06/05/2020, 3:36 PM

## 2020-06-06 LAB — CBC
HCT: 30.4 % — ABNORMAL LOW (ref 39.0–52.0)
Hemoglobin: 10.1 g/dL — ABNORMAL LOW (ref 13.0–17.0)
MCH: 32.6 pg (ref 26.0–34.0)
MCHC: 33.2 g/dL (ref 30.0–36.0)
MCV: 98.1 fL (ref 80.0–100.0)
Platelets: 259 10*3/uL (ref 150–400)
RBC: 3.1 MIL/uL — ABNORMAL LOW (ref 4.22–5.81)
RDW: 14.2 % (ref 11.5–15.5)
WBC: 7.5 10*3/uL (ref 4.0–10.5)
nRBC: 0 % (ref 0.0–0.2)

## 2020-06-06 MED ORDER — CLOPIDOGREL BISULFATE 75 MG PO TABS
75.0000 mg | ORAL_TABLET | Freq: Every day | ORAL | Status: DC
  Administered 2020-06-06 – 2020-06-07 (×2): 75 mg via ORAL
  Filled 2020-06-06 (×2): qty 1

## 2020-06-06 MED ORDER — ASPIRIN EC 325 MG PO TBEC
325.0000 mg | DELAYED_RELEASE_TABLET | Freq: Every day | ORAL | Status: DC
  Administered 2020-06-06 – 2020-06-07 (×2): 325 mg via ORAL
  Filled 2020-06-06 (×2): qty 1

## 2020-06-06 MED ORDER — LISINOPRIL 10 MG PO TABS
10.0000 mg | ORAL_TABLET | Freq: Every day | ORAL | Status: DC
  Administered 2020-06-06 – 2020-06-07 (×2): 10 mg via ORAL
  Filled 2020-06-06 (×2): qty 1

## 2020-06-06 NOTE — Progress Notes (Signed)
[facility] Progress Note  Patient: [name]    ONG:[id number]  DOA: 05/31/2020     Date of Service: the patient was seen and examined on 06/06/2020  Brief hospital course: Past medical history of stroke, HTN, anemia, PVD.  Presents with complaints of confusion, expressive aphasia as well as hemianopsia. Neurology was consulted. IR was consulted and underwent MCA stenting. Currently medically ready for discharge.  Patient absolutely requires 24-hour supervision per PT OT and speech therapy as well as neurology.  Daughter unable to provide 24-hour supervision.  No other family member available to do the same as well.  Patient used to live alone prior to this admission. Currently plan is arranging safe discharge.  Assessment and Plan: Acute stroke MRI showed an acute to early subacute ischemic infarct involving the left temporal occipital region.  Patient has had multiple strokes in the last few months.  He underwent left ICA stent placement on 5/2 which was done by interventional radiology.   Patient was supposed to be on aspirin and Plavix which is being continued here.   LDL 42.  HbA1c 5.9.  Patient noted to be on statin at home. PT OT SLP evaluation is ongoing.  From a physical functioning standpoint he seems to be doing well but due to the significant aphasia is thought to require 24/7 supervision.  He does not have supervision at home.  He lives by himself.  This will be an unsafe situation for him.  Would benefit from going to skilled nursing facility for rehab. Neurology continues to follow.    Underwent cerebral angiogram by interventional radiology for MCA intervention.  Carotid artery stenosis Underwent stent placement to left ICA on 5/2, by interventional radiology.  To undergo cerebral angiogram today.  Wernicke's aphasia Patient's inability to answer questions most likely is due to his significant aphasia.  There is also significant receptive aphasia as per speech  therapy.  This could potentially be a safety issue especially as patient apparently lives by himself.  As per SLP patient will need 24-hour supervision.  Hypotension in the setting of known history of hypertension His antihypertensives including HCTZ and lisinopril were held.   He was given fluid boluses and started on IV fluids.  Blood pressures have improved.  We will stop IV fluids. Hypotension thought to be due to hypovolemia.  Acute kidney injury Came in with a creatinine of 1.5.  Baseline appears to be 0.87.  Baseline around 0.8.  Per history he has had poor oral intake.  Could have been dehydrated.   He was given IV fluids with improvement in renal function.  Monitor urine output.    Normocytic anemia Hemoglobin was 13.1 in April.  Over the last 2 weeks it has been in the 9-10 range.  No overt bleeding noted.   Anemia panel shows ferritin 446, iron 109, TIBC 337, folate 28.8, vitamin B12 688.   Further evaluation in the outpatient setting.  Diabetes mellitus type 2, diet controlled HbA1c 5.9.  Not noted to be on any glucose lowering agents at home.  No need to place him on sliding scale coverage at this time.  Concern for depression Daughter raised concern that patient may be depressed.  Apparently he has been talking about death to his friends and family.  Unable to assess suicidal ideation at this time.   Psychiatry consultation appreciated.  Currently no medication.  No active suicidal ideation.  Outpatient follow-up with psychiatry.  Diet: Regular diet DVT Prophylaxis: Subcutaneous Lovenox  Place  and maintain sequential compression device Start: 06/06/20 0825 Advance goals of care discussion: Full code  Family Communication: no family was present at bedside, at the time of interview.   Disposition:  Status is: Inpatient  Remains inpatient appropriate because:Unsafe d/c plan  Dispo:  Patient From: Home  Planned Disposition: Skilled Nursing Facility  Medically  stable for discharge: No     Subjective: No nausea no vomiting.  Vision improving.  No chest pain.  No shortness of breath.  Repeating same conversation again and again.  Physical Exam:  General: Appear in mild distress, no Rash; Oral Mucosa Clear, moist. no Abnormal Neck Mass Or lumps, Conjunctiva normal  Cardiovascular: S1 and S2 Present, no Murmur, Respiratory: good respiratory effort, Bilateral Air entry present and CTA, no Crackles, no wheezes Abdomen: Bowel Sound present, Soft and no tenderness Extremities: no Pedal edema Neurology: alert and oriented to time, place, and person affect appropriate. no new focal deficit, expressive aphasia Gait not checked due to patient safety concerns    Vitals:   06/06/20 0728 06/06/20 1215 06/06/20 1614 06/06/20 1929  BP: 140/74 136/75 128/62 118/68  Pulse: 71 66 69 70  Resp: 18 18 18 19   Temp: 97.8 F (36.6 C) 98.1 F (36.7 C) 98.1 F (36.7 C) 98.2 F (36.8 C)  TempSrc: Oral Oral Oral Oral  SpO2: 100% 100% 100% 100%  Height:        Intake/Output Summary (Last 24 hours) at 06/06/2020 1938 Last data filed at 06/06/2020 1300 Gross per 24 hour  Intake 940 ml  Output --  Net 940 ml   There were no vitals filed for this visit.  Data Reviewed: I have personally reviewed and interpreted daily labs, tele strips, imaging. I reviewed all nursing notes, pharmacy notes, vitals, pertinent old records I have discussed plan of care as described above with RN and patient/family.  CBC: Recent Labs  Lab 05/31/20 1411 06/01/20 0231 06/02/20 0425 06/04/20 0021 06/05/20 0419 06/06/20 0408  WBC 8.5 6.5 6.3 6.5 6.6 7.5  NEUTROABS 6.7  --   --  4.1 4.5  --   HGB 10.8* 9.9* 10.2* 9.5* 9.3* 10.1*  HCT 33.0* 30.3* 31.0* 28.2* 29.1* 30.4*  MCV 99.1 97.4 98.1 96.9 101.7* 98.1  PLT 364 371 284 252 232 259   Basic Metabolic Panel: Recent Labs  Lab 05/31/20 1411 06/01/20 0231 06/02/20 0425 06/05/20 0419  NA 135 136 137 139  K 4.0 3.5 3.9  3.7  CL 99 102 103 106  CO2 28 28 29 29   GLUCOSE 111* 109* 106* 116*  BUN 35* 31* 20 13  CREATININE 1.52* 1.19 1.09 0.83  CALCIUM 10.5* 9.6 10.0 9.5    Studies: No results found.  Scheduled Meds: . aspirin EC  325 mg Oral Daily  . atorvastatin  80 mg Oral Daily  . clopidogrel  75 mg Oral Daily  . lisinopril  10 mg Oral Daily   Continuous Infusions:  PRN Meds: acetaminophen **OR** acetaminophen (TYLENOL) oral liquid 160 mg/5 mL **OR** acetaminophen, lidocaine  Time spent: 35 minutes  Author: 06/07/20, MD [facility] 06/06/2020 7:38 PM  To reach On-call, see care teams to locate the attending and reach out via [email or web address]. Between 7PM-7AM, please contact night-coverage If you still have difficulty reaching the attending provider, please page the [facility] (Director on Call) for [facility] on amion for assistance.

## 2020-06-06 NOTE — TOC Progression Note (Signed)
Transition of Care [facility]) - Progression Note    Patient Details  Name: TREASURE INGRUM MRN: [id number] Date of Birth: [birth year]  Transition of [facility]) CM/SW Contact  Kermit Balo, RN Phone Number: 06/06/2020, 2:24 PM  Clinical Narrative:    CM faxed updated notes to the [facility] appeals: [phone] yesterday and this am. Continue to await decision on SNF rehab.   Expected Discharge Plan: Skilled Nursing Facility Barriers to Discharge: Continued Medical Work up  Expected Discharge Plan and Services Expected Discharge Plan: Skilled Nursing Facility In-house Referral: Clinical Social Work Discharge Planning Services: CM Consult Post Acute Care Choice: Skilled Nursing Facility Living arrangements for the past 2 months: Single Family Home                                       Social Determinants of Health (SDOH) Interventions    Readmission Risk Interventions No flowsheet data found.

## 2020-06-07 LAB — CREATININE, SERUM
Creatinine, Ser: 0.9 mg/dL (ref 0.61–1.24)
GFR, Estimated: >60 mL/min (ref >60)

## 2020-06-07 LAB — RESP PANEL BY RT-PCR (FLU A&B, COVID) ARPGX2
Influenza A by PCR: NEGATIVE
Influenza B by PCR: NEGATIVE
SARS Coronavirus 2 by RT PCR: NEGATIVE

## 2020-06-07 MED ORDER — ASPIRIN 325 MG PO TBEC: 325.0000 mg | DELAYED_RELEASE_TABLET | Freq: Every day | ORAL | 0 refills | Status: AC

## 2020-06-07 MED ORDER — LISINOPRIL 10 MG PO TABS
10.0000 mg | ORAL_TABLET | Freq: Every day | ORAL | 0 refills | Status: AC
Start: 2020-06-08 — End: ?

## 2020-06-07 NOTE — Progress Notes (Addendum)
  Speech Language Pathology Treatment: Cognitive-Linquistic  Patient Details Name: JORI THRALL MRN: [id number] DOB: 1950-07-22 Today's Date: 06/07/2020 Time: 1435-1500 SLP Time Calculation (min) (ACUTE ONLY): 25 min  Assessment / Plan / Recommendation Clinical Impression  Pt seen for skilled aphasia treatment with improvement made in expression/receptive language, but pt continues to use verbose, tangential language with confabulations/inaccurate information intermittently during complex conversation.  He exhibited phonemic errors such as "bagroom" instead of "bathroom" along with other errors scattered into conversational speech.  Receptively, he was able to follow simple 1-2 step functional directives, but would often get distracted by environmental factors and was agitated and dressed to leave hospital when SLP arrived.  He perseverated on his daughter coming to pick him up from the hospital with reminders that she was arriving potentially 3-4 hrs from tx session time.  Pt with decreased reasoning, problem solving and insight during session when discussing d/c to a facility and potential driving with decreased awareness noted; pt would benefit from OP SLP or SLP f/u when he arrives at SNF if family is able to secure a spot near home with 24 hr supervision recommended.     HPI HPI: 70 y.o. male admitted with new CVA.  PMHx prior left PCA stoke, placement of ICA stent for left ICA stenosis 5/2. Baseline expressive aphasia. MRI: late subacute to chronic appearance of left PCA distribution infarct; acute-appearing abnormality left temporal-occipital region. Patchy involvement left basal ganglia.      SLP Plan  Continue with current plan of care       Recommendations   OP SLP; continue ST in acute setting                Follow up Recommendations: Outpatient SLP SLP Visit Diagnosis: Aphasia (R47.01) Plan: Continue with current plan of care                      Tressie Stalker,  M.S., CCC-SLP 06/07/2020, 4:36 PM

## 2020-06-07 NOTE — Progress Notes (Signed)
Patient left in company of [facility] via stretcher in stable condition

## 2020-06-07 NOTE — Progress Notes (Signed)
OT Cancellation Note  Patient Details Name: [name] MRN: [id number] DOB: July 11, 1950   Cancelled Treatment:    Reason Eval/Treat Not Completed: Patient already dressed and preparing for d/c to SNF for primarily cognitive retraining.    Avyukt Cimo D Najiyah Paris 06/07/2020, 4:09 PM

## 2020-06-07 NOTE — TOC Transition Note (Signed)
Transition of Care [facility]) - CM/SW Discharge Note   Patient Details  Name: [name] MRN: [id number] Date of Birth: [birth year]  [facility] Care [facility]) CM/SW Contact:  Kermit Balo, RN Phone Number: 06/07/2020, 2:59 PM   Clinical Narrative:    Patient is discharging to [facility] SNF rehab. He won his appeal with auth: [id number]--[facility] through 5/25 Pt will transport via [facility]. Bedside RN updated and d/c packet at the desk.  Room: [address] Number for report: [phone]   Final next level of care: Skilled Nursing Facility Barriers to Discharge: No Barriers Identified   Patient Goals and CMS Choice   CMS Medicare.gov Compare Post Acute Care list provided to:: Patient Choice offered to / list presented to : Patient,Adult Children  Discharge Placement              Patient [facility] bed at: [facility] Patient to be transferred to facility by: [facility] Name of family member notified: [name] Patient and family notified of of transfer: 06/07/20  Discharge Plan and Services In-house Referral: Clinical Social Work Discharge Planning Services: [employer] Consult Post Acute Care Choice: Skilled Nursing Facility                               Social Determinants of Health (SDOH) Interventions     Readmission Risk Interventions No flowsheet data found.

## 2020-06-07 NOTE — Discharge Summary (Signed)
[facility] Discharge Summary   Patient: [name] VPX:[id number]  PCP: Heywood Bene, PA-C  Date of admission: 05/31/2020   Date of discharge:  06/07/2020     Discharge Diagnoses:   Principal Problem:   Stroke (cerebrum) [facility]) Active Problems:   Acute stroke due to occlusion of left posterior cerebral artery ([place])   Essential hypertension   Controlled type 2 diabetes mellitus without complication, without long-term current use of insulin (HCC)   Middle cerebral artery stenosis, left   Altered mental status   Admitted From: home Disposition:  SNF   Recommendations for Outpatient Follow-up:  1. PCP: please follow up in 1 week 2. Follow up LABS/TEST:  none 3. New Meds: full aspirin 4. Changed meds: lisinopril dose reduced 5. Stopped meds: none   Contact information for follow-up providers    Luanne Bras, MD Follow up in 3 week(s).   Specialties: Interventional Radiology, Radiology Why: Please follow-up with Dr. Estanislado Pandy in clinic 2-3 weeks after discharge. Please ensure your daughter comes with you. Our office will call you to set up this appointment. Contact information: [place] [address] [phone]        Suzzanne Cloud, NP. Go on 07/04/2020.   Specialty: Neurology Contact information: [address]  [place] [address] [phone]        [facility]. Schedule an appointment as soon as possible for a visit in 1 month(s).   Specialty: Neurology Contact information: [address] [place] [phone]           Contact information for after-discharge care    Destination    [facility] .   Service: Skilled Nursing Contact information: [address] [place] [phone]                 Discharge Instructions    Diet - low sodium heart healthy   Complete by: As directed    Increase  activity slowly   Complete by: As directed    No wound care   Complete by: As directed       Diet recommendation: Cardiac diet  Activity: The patient is advised to gradually reintroduce usual activities, as tolerated  Discharge Condition: stable  Code Status: Full code   History of present illness: As per the H and P dictated on admission, "[name] is an 70 y.o. male with history of prior PCA stroke and placement of ICA stent for left ICA stenosis 10 days ago was found by his friend today to be altered.  History is entirely per chart and BH H note.  Patient apparently was staying with his daughter until 5 days ago when he was picked up by his friend.  Apparently over the last couple of days patient has been increasingly confused and has had decreased p.o. intake.  Patient's friend brought him to behavioral health where he was seen by the family nurse practitioner there who called to the ED.  It is unclear when his last known normal was.  I spoke with patient's daughter Arturo Morton who notes that patient has expressive aphasia at baseline and that has had multiple strokes in the past couple of months.   Patient himself has a clear expressive aphasia that is fluent.  He is intermittently making sense but intermittently not.  He has some word salad but no neologisms.  He seems to understand that he is in the  hospital.  He does not feel unwell.  He states he is hungry.  He has no questions  ED Course:  The patient was noted to be afebrile and normotensive.  He underwent a noncontrast CT which shows a large PCA and MCA territory stroke involving left temporal lobe.  He was discussed with neurology who recommended admission to hospitalist service."  Hospital Course:  Summary of his active problems in the hospital is as following.   Acute stroke MRI showed an acute to early subacute ischemic infarct involving the left temporal occipital region. Patient has had multiple strokes in  the last few months. He underwent left ICA stent placement on 5/2 which was done by interventional radiology.  Patient was supposed to be on aspirin and Plavix which is being continued here.  LDL 42. HbA1c 5.9. Patient noted to be on statin at home. PT OT SLP evaluation is ongoing. From a physical functioning standpoint he seems to be doing well but due to the significant aphasia is thought to require 24/7 supervision.He does not have supervision at home. He lives by himself. This will be an unsafe situation for him. Would benefit from going to skilled nursing facility for rehab. Neurology continues to follow.   Underwent cerebral angiogram by interventional radiology for MCA intervention.  Carotid artery stenosis Underwent stent placement to left ICA on 5/2, by interventional radiology.To undergo cerebral angiogram today.  Wernicke's aphasia Patient's inability to answer questions most likely is due to his significant aphasia. There is also significant receptive aphasia as per speech therapy. This could potentially be a safety issue especially as patient apparently lives by himself. As per SLP patient will need 24-hour supervision.  Hypotension in the setting of known history of hypertension His antihypertensives including HCTZ and lisinopril were held.  He was given fluid boluses and started on IV fluids. Blood pressures have improved. We will stop IV fluids. Hypotension thought to be due to hypovolemia.  Acute kidney injury Came in with a creatinine of 1.5. Baseline appears to be 0.87. Baseline around 0.8. Per history he has had poor oral intake. Could have been dehydrated.  He was given IV fluids with improvement in renal function. Monitor urine output.   Normocytic anemia Hemoglobin was 13.1 in April. Over the last 2 weeks it has been in the 9-10 range. No overt bleeding noted.  Anemia panel shows ferritin 446, iron 109, TIBC 337, folate 28.8, vitamin  B12 688.  Further evaluation in the outpatient setting.  Diabetes mellitus type 2, diet controlled HbA1c 5.9. Not noted to be on any glucose lowering agents at home. No need to place him on sliding scale coverage at this time.  Concern for depression Daughter raised concern that patient may be depressed. Apparently he has been talking about death to his friends and family. Unable to assess suicidal ideation at this time.  Psychiatry consultation appreciated.  Currently no medication.  No active suicidal ideation.  Outpatient follow-up with psychiatry.  Patient was seen by physical therapy, who recommended SNF. On the day of the discharge the patient's vitals were stable, and no other new acute medical condition were reported. The patient was felt safe to be discharge at SNF with Therapy.  Consultants: Neurology  IR neuro Psychiatry Procedures:  S/P Lt common carotid arteriogram followed stent asisted angioplasty of symptomatic severe Lr MCA stenosis.  DISCHARGE MEDICATION: Allergies as of 06/07/2020      Reactions   Ciprodex [ciprofloxacin-dexamethasone] Swelling   Ear canal swelling   Metformin  And Related Other (See Comments)   Upset stomach      Medication List    STOP taking these medications   aspirin 81 MG chewable tablet Replaced by: aspirin 325 MG EC tablet   hydrochlorothiazide 25 MG tablet Commonly known as: HYDRODIURIL   potassium chloride SA 20 MEQ tablet Commonly known as: KLOR-CON     TAKE these medications   acetaminophen 500 MG tablet Commonly known as: TYLENOL Take 1,000 mg by mouth every 6 (six) hours as needed for moderate pain or headache.   aspirin 325 MG EC tablet Take 1 tablet (325 mg total) by mouth daily. Start taking on: Jun 08, 2020 Replaces: aspirin 81 MG chewable tablet   atorvastatin 80 MG tablet Commonly known as: LIPITOR Take 1 tablet (80 mg total) by mouth daily.   b complex vitamins capsule Take 1 capsule by mouth  daily.   clopidogrel 75 MG tablet Commonly known as: Plavix Take 0.5 tablets (37.5 mg total) by mouth daily.   lisinopril 10 MG tablet Commonly known as: ZESTRIL Take 1 tablet (10 mg total) by mouth daily. Start taking on: Jun 08, 2020 What changed:   medication strength  how much to take   traZODone 50 MG tablet Commonly known as: DESYREL Take 50 mg by mouth at bedtime as needed for sleep.   VITAMIN D-VITAMIN K PO Take 1 capsule by mouth daily.       Discharge Exam: There were no vitals filed for this visit. Vitals:   06/07/20 0749 06/07/20 1123  BP: 125/71 125/77  Pulse: 67 77  Resp: 18 18  Temp: 98.1 F (36.7 C)   SpO2: 100% 100%   General: Appear in mild distress, no Rash; Oral Mucosa Clear, moist. no Abnormal Neck Mass Or lumps, Conjunctiva normal  Cardiovascular: S1 and S2 Present, no Murmur, Respiratory: good respiratory effort, Bilateral Air entry present and CTA, no Crackles, no wheezes Abdomen: Bowel Sound present, Soft and no tenderness Extremities: no Pedal edema Neurology: alert and oriented to time, place, and person affect appropriate. no new focal deficit has spontaneous speech with forgetfulness and repeating same thing again and again. intermittent word salad, followed simple commands, able to mimic, able to answer orientation questions. Anomia but able to describe the function of an object, not able to repeat, alexia with agraphia but only able to write down his name. Not able to follow two-step commands. Right hemianopia improved, able to see medial halfwiththe visual field on the right. No gaze palsy, facial symmetrical, arm or leg motor or sensory intact. Finger-to-nose intact bilaterally. Gait not checked due to patient safety concerns  The results of significant diagnostics from this hospitalization (including imaging, microbiology, ancillary and laboratory) are listed below for reference.    Significant Diagnostic Studies: CT Head Wo  Contrast  Result Date: 05/31/2020 CLINICAL DATA:  70 year old sent from [facility] for evaluation of confusion of unknown duration. EXAM: CT HEAD WITHOUT CONTRAST TECHNIQUE: Contiguous axial images were obtained from the base of the skull through the vertex without intravenous contrast. COMPARISON:  CT head 03/15/2020.  MRI brain 03/16/2020. FINDINGS: Brain: Geographic low attenuation involving the LEFT temporal lobe, LEFT posterior parietal lobe and the entire LEFT occipital lobe (within the distribution of the LEFT posterior cerebral artery and a portion of the LEFT middle cerebral artery) with sparing of a few peripheral gyri. The patient had a small stroke in this distribution on the prior MRI in February, 2022, but today the distribution involves nearly  the entire occipital lobe and part of the posterior parietal lobe. There is a small focus of acute hemorrhage inferiorly and medially in the occipital lobe (series 2, image 14 and series 4, image 42). At this point, there is no significant mass effect or midline shift. Ventricular system normal in size and appearance for age. Stable mild age related cortical atrophy. Vascular: Moderate BILATERAL carotid siphon and LEFT vertebral artery atherosclerosis. Hyperdense LEFT posterior cerebral artery (2/13). Skull: No skull fracture or other focal osseous abnormality involving the skull. Sinuses/Orbits: Visualized paranasal sinuses, bilateral mastoid air cells and bilateral middle ear cavities well-aerated. Visualized orbits and globes normal in appearance. Other: None. IMPRESSION: 1. 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 2. Hyperdense LEFT posterior cerebral artery. 3. Very small associated acute hemorrhage in the inferomedial occipital lobe. 4. No significant  mass effect or midline shift at this time. I telephoned these critical results at the time of interpretation on 05/31/2020 at 3:08 pm to provider [facility], who verbally acknowledged these results. Electronically Signed   By: Evangeline Dakin M.D.   On: 05/31/2020 15:13   MR ANGIO HEAD WO CONTRAST  Result Date: 06/01/2020 CLINICAL DATA:  Follow-up examination for acute stroke. EXAM: MRI HEAD WITHOUT CONTRAST MRA HEAD WITHOUT CONTRAST MRA NECK WITHOUT AND WITH CONTRAST TECHNIQUE: Multiplanar, multi-echo pulse sequences of the brain and surrounding structures were acquired without intravenous contrast. Angiographic images of the Circle of Willis were acquired using MRA technique without intravenous contrast. Angiographic images of the neck were acquired using MRA technique without and with intravenous contrast. Carotid stenosis measurements (when applicable) are obtained utilizing NASCET criteria, using the distal internal carotid diameter as the denominator. CONTRAST:  8.3m GADAVIST GADOBUTROL 1 MMOL/ML IV SOLN COMPARISON:  Prior studies from 05/31/2020 as well as earlier. FINDINGS: MRI HEAD FINDINGS Brain: Generalized age-related cerebral atrophy. There has been interval evolution of previously identified left PCA distribution infarct, now late subacute to chronic in appearance. There are superimposed scattered areas of more acute appearing diffusion abnormality involving the left temporal occipital region, corresponding with abnormality on most recent CT. Associated hyperintense T2/FLAIR signal corresponds with the areas of diffusion abnormality, suggesting acute to early subacute ischemic change. Minimal patchy involvement of the left basal ganglia noted. Few scattered serpiginous foci of susceptibility artifact and T1 hyperintensity seen, consistent with mild petechial blood products and/or laminar necrosis. The preponderance of this finding is associated with the more chronic left PCA distribution  infarct. No frank hemorrhagic transformation or significant regional mass effect. No other areas of acute or subacute infarction elsewhere within the brain. Gray-white matter differentiation otherwise maintained. No mass lesion or significant mass effect. No midline shift or hydrocephalus. No extra-axial fluid collection. Pituitary gland suprasellar region normal. Midline structures intact. Vascular: Irregular abnormal flow void within the left V4 segment, consistent with slow flow and/or occlusion (series 10, image 2). Left PCA is likely occluded. Major intracranial vascular flow voids are otherwise maintained. Skull and upper cervical spine: Craniocervical junction within normal limits. Bone marrow signal intensity normal. No scalp soft tissue abnormality. Sinuses/Orbits: Globes and orbital soft tissues demonstrate no acute finding. Paranasal sinuses are largely clear. No significant mastoid effusion. Inner ear structures grossly normal. Other: None. MRA HEAD FINDINGS ANTERIOR CIRCULATION: Visualized distal cervical segments of both internal carotid arteries are patent with antegrade flow. Petrous, cavernous,  and supraclinoid segments patent without flow-limiting stenosis. 2 mm focal outpouching extending laterally and slightly posteriorly from the cavernous left ICA likely reflects a small vascular infundibulum, seen on prior arteriogram (series 1, image 95). A1 segments patent bilaterally. Normal anterior communicating artery complex. Both ACAs patent to their distal aspects without stenosis. Short-segment severe mid left M1 stenosis (series 1031, image 11). Mild to moderate stenosis at the distal right M1 segment (series 1037, image 10). Normal MCA bifurcations. MCA branches well perfused although demonstrate diffuse small vessel atheromatous irregularity. POSTERIOR CIRCULATION: Right V4 segment mildly irregular but patent without significant stenosis by MRA. Severe stenosis involving the intracranial left V4  segment with markedly attenuated flow. There remains some persistent antegrade flow to the vertebrobasilar junction. Both PICA origins remain patent and well perfused. 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 Basilar widely patent distally. Superior cerebellar arteries patent bilaterally. Right PCA supplied via the basilar as well as a small right posterior communicating artery. Severe stenosis at the distal right P2/P3 junction (series 1043, image 16). Right PCA patent distally. There is occlusion of the left PCA at the proximal left P2 segment. A small left PCOM remains patent. MRA NECK FINDINGS AORTIC ARCH: Visualized aortic arch normal in caliber. Bovine branching pattern noted. No stenosis about the origin of the great vessels. RIGHT CAROTID SYSTEM: Right CCA patent to the bifurcation. No significant atheromatous irregularity or stenosis about the right carotid bulb. Right ICA patent distally without stenosis, evidence for dissection or occlusion. LEFT CAROTID SYSTEM: Left common carotid artery patent from its origin to the region of the left carotid bulb. 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 Area of signal loss measures 3.5 cm in craniocaudad dimension. Evaluation for potential intra stent stenosis limited by MRA, although robust flow seen within the ICA distal to the stent. No other stenosis, evidence for dissection or occlusion. VERTEBRAL ARTERIES: Both vertebral arteries arise from the subclavian arteries. Focal tortuosity with short-segment of approximately 50% stenosis at the proximal right subclavian artery, proximal to the takeoff of the right vertebral artery (series 1009, image 11), similar to prior CTA. Relatively mild 30% atheromatous stenosis at the origins of the vertebral arteries not significantly changed. Vertebral arteries otherwise patent within the neck without stenosis, evidence  for dissection or occlusion. IMPRESSION: MRI HEAD IMPRESSION: 1. 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 Mild patchy involvement at the left basal ganglia. No frank hemorrhagic transformation or significant regional mass effect. 2. Interval evolution of superimposed/underlying left PCA distribution infarct, now late subacute to chronic in appearance. Associated susceptibility artifact consistent with petechial blood products and/or laminar necrosis. MRA HEAD IMPRESSION: 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 2. 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 3. Short-segment severe mid left M1 stenosis, with short-segment mild to moderate distal right M1 stenosis. 4. Distal small vessel atheromatous irregularity throughout the intracranial circulation. MRA NECK IMPRESSION: 1. 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 Evaluation for potential intra stent stenosis limited by MRA, although robust flow seen within the ICA distal to the stent. 2. Wide patency of the right carotid artery system without stenosis. 3. Relatively mild 30% atheromatous stenosis at the origins of  the vertebral arteries. 4. Short-segment 50% stenosis involving the proximal right subclavian artery, proximal to the takeoff of the right vertebral artery. Electronically Signed   By: Jeannine Boga M.D.   On: 06/01/2020 04:49   MR ANGIO NECK W WO CONTRAST  Result Date: 06/01/2020 CLINICAL DATA:  Follow-up examination for acute stroke. EXAM: MRI HEAD WITHOUT CONTRAST MRA HEAD WITHOUT CONTRAST MRA NECK WITHOUT AND WITH CONTRAST TECHNIQUE: Multiplanar, multi-echo pulse sequences of the brain and surrounding structures were acquired without intravenous contrast.  Angiographic images of the Circle of Willis were acquired using MRA technique without intravenous contrast. Angiographic images of the neck were acquired using MRA technique without and with intravenous contrast. Carotid stenosis measurements (when applicable) are obtained utilizing NASCET criteria, using the distal internal carotid diameter as the denominator. CONTRAST:  8.3m GADAVIST GADOBUTROL 1 MMOL/ML IV SOLN COMPARISON:  Prior studies from 05/31/2020 as well as earlier. FINDINGS: MRI HEAD FINDINGS Brain: Generalized age-related cerebral atrophy. There has been interval evolution of previously identified left PCA distribution infarct, now late subacute to chronic in appearance. There are superimposed scattered areas of more acute appearing diffusion abnormality involving the left temporal occipital region, corresponding with abnormality on most recent CT. Associated hyperintense T2/FLAIR signal corresponds with the areas of diffusion abnormality, suggesting acute to early subacute ischemic change. Minimal patchy involvement of the left basal ganglia noted. Few scattered serpiginous foci of susceptibility artifact and T1 hyperintensity seen, consistent with mild petechial blood products and/or laminar necrosis. The preponderance of this finding is associated with the more chronic left PCA distribution infarct. No frank hemorrhagic transformation or significant regional mass effect. No other areas of acute or subacute infarction elsewhere within the brain. Gray-white matter differentiation otherwise maintained. No mass lesion or significant mass effect. No midline shift or hydrocephalus. No extra-axial fluid collection. Pituitary gland suprasellar region normal. Midline structures intact. Vascular: Irregular abnormal flow void within the left V4 segment, consistent with slow flow and/or occlusion (series 10, image 2). Left PCA is likely occluded. Major intracranial vascular flow voids are otherwise maintained.  Skull and upper cervical spine: Craniocervical junction within normal limits. Bone marrow signal intensity normal. No scalp soft tissue abnormality. Sinuses/Orbits: Globes and orbital soft tissues demonstrate no acute finding. Paranasal sinuses are largely clear. No significant mastoid effusion. Inner ear structures grossly normal. Other: None. MRA HEAD FINDINGS ANTERIOR CIRCULATION: Visualized distal cervical segments of both internal carotid arteries are patent with antegrade flow. Petrous, cavernous, and supraclinoid segments patent without flow-limiting stenosis. 2 mm focal outpouching extending laterally and slightly posteriorly from the cavernous left ICA likely reflects a small vascular infundibulum, seen on prior arteriogram (series 1, image 95). A1 segments patent bilaterally. Normal anterior communicating artery complex. Both ACAs patent to their distal aspects without stenosis. Short-segment severe mid left M1 stenosis (series 1031, image 11). Mild to moderate stenosis at the distal right M1 segment (series 1037, image 10). Normal MCA bifurcations. MCA branches well perfused although demonstrate diffuse small vessel atheromatous irregularity. POSTERIOR CIRCULATION: Right V4 segment mildly irregular but patent without significant stenosis by MRA. Severe stenosis involving the intracranial left V4 segment with markedly attenuated flow. There remains some persistent antegrade flow to the vertebrobasilar junction. Both PICA origins remain patent and well perfused. 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 Basilar widely patent distally. Superior cerebellar arteries patent bilaterally. Right PCA supplied via the basilar as well as a small right posterior communicating artery. Severe stenosis at the distal right P2/P3 junction (series  1043, image 16). Right PCA patent distally. There is occlusion of the left PCA at the proximal left P2 segment. A small left PCOM remains  patent. MRA NECK FINDINGS AORTIC ARCH: Visualized aortic arch normal in caliber. Bovine branching pattern noted. No stenosis about the origin of the great vessels. RIGHT CAROTID SYSTEM: Right CCA patent to the bifurcation. No significant atheromatous irregularity or stenosis about the right carotid bulb. Right ICA patent distally without stenosis, evidence for dissection or occlusion. LEFT CAROTID SYSTEM: Left common carotid artery patent from its origin to the region of the left carotid bulb. 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 Area of signal loss measures 3.5 cm in craniocaudad dimension. Evaluation for potential intra stent stenosis limited by MRA, although robust flow seen within the ICA distal to the stent. No other stenosis, evidence for dissection or occlusion. VERTEBRAL ARTERIES: Both vertebral arteries arise from the subclavian arteries. Focal tortuosity with short-segment of approximately 50% stenosis at the proximal right subclavian artery, proximal to the takeoff of the right vertebral artery (series 1009, image 11), similar to prior CTA. Relatively mild 30% atheromatous stenosis at the origins of the vertebral arteries not significantly changed. Vertebral arteries otherwise patent within the neck without stenosis, evidence for dissection or occlusion. IMPRESSION: MRI HEAD IMPRESSION: 1. 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 Mild patchy involvement at the left basal ganglia. No frank hemorrhagic transformation or significant regional mass effect. 2. Interval evolution of superimposed/underlying left PCA distribution infarct, now late subacute to chronic in appearance. Associated susceptibility artifact consistent with petechial blood products and/or laminar necrosis. MRA HEAD IMPRESSION: 1. 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 2. 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 3. Short-segment severe mid left M1 stenosis, with short-segment mild to moderate distal right M1 stenosis. 4. Distal small vessel atheromatous irregularity throughout the intracranial circulation. MRA NECK IMPRESSION: 1. 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 Evaluation for potential intra stent stenosis limited by MRA, although robust flow seen within the ICA distal to the stent. 2. Wide patency of the right carotid artery system without stenosis. 3. Relatively mild 30% atheromatous stenosis at the origins of the vertebral arteries. 4. Short-segment 50% stenosis involving the proximal right subclavian artery, proximal to the takeoff of the right vertebral artery. Electronically Signed   By: Jeannine Boga M.D.   On: 06/01/2020 04:49   MR BRAIN WO CONTRAST  Result Date: 06/01/2020 CLINICAL DATA:  Follow-up examination for acute stroke. EXAM: MRI HEAD WITHOUT CONTRAST MRA HEAD WITHOUT CONTRAST MRA NECK WITHOUT AND WITH CONTRAST TECHNIQUE: Multiplanar, multi-echo pulse sequences of the brain and surrounding structures were acquired without intravenous contrast. Angiographic images of the Circle of Willis were acquired using MRA technique without intravenous contrast. Angiographic images of the neck were acquired using MRA technique without and with intravenous contrast. Carotid stenosis measurements (when applicable) are obtained utilizing NASCET criteria, using the distal internal carotid diameter as the denominator. CONTRAST:  8.68m GADAVIST GADOBUTROL 1 MMOL/ML IV SOLN COMPARISON:  Prior studies from 05/31/2020 as well as earlier. FINDINGS: MRI HEAD FINDINGS Brain: Generalized age-related cerebral atrophy. There has been interval evolution of previously identified left PCA  distribution infarct, now late subacute to  chronic in appearance. There are superimposed scattered areas of more acute appearing diffusion abnormality involving the left temporal occipital region, corresponding with abnormality on most recent CT. Associated hyperintense T2/FLAIR signal corresponds with the areas of diffusion abnormality, suggesting acute to early subacute ischemic change. Minimal patchy involvement of the left basal ganglia noted. Few scattered serpiginous foci of susceptibility artifact and T1 hyperintensity seen, consistent with mild petechial blood products and/or laminar necrosis. The preponderance of this finding is associated with the more chronic left PCA distribution infarct. No frank hemorrhagic transformation or significant regional mass effect. No other areas of acute or subacute infarction elsewhere within the brain. Gray-white matter differentiation otherwise maintained. No mass lesion or significant mass effect. No midline shift or hydrocephalus. No extra-axial fluid collection. Pituitary gland suprasellar region normal. Midline structures intact. Vascular: Irregular abnormal flow void within the left V4 segment, consistent with slow flow and/or occlusion (series 10, image 2). Left PCA is likely occluded. Major intracranial vascular flow voids are otherwise maintained. Skull and upper cervical spine: Craniocervical junction within normal limits. Bone marrow signal intensity normal. No scalp soft tissue abnormality. Sinuses/Orbits: Globes and orbital soft tissues demonstrate no acute finding. Paranasal sinuses are largely clear. No significant mastoid effusion. Inner ear structures grossly normal. Other: None. MRA HEAD FINDINGS ANTERIOR CIRCULATION: Visualized distal cervical segments of both internal carotid arteries are patent with antegrade flow. Petrous, cavernous, and supraclinoid segments patent without flow-limiting stenosis. 2 mm focal outpouching extending laterally and  slightly posteriorly from the cavernous left ICA likely reflects a small vascular infundibulum, seen on prior arteriogram (series 1, image 95). A1 segments patent bilaterally. Normal anterior communicating artery complex. Both ACAs patent to their distal aspects without stenosis. Short-segment severe mid left M1 stenosis (series 1031, image 11). Mild to moderate stenosis at the distal right M1 segment (series 1037, image 10). Normal MCA bifurcations. MCA branches well perfused although demonstrate diffuse small vessel atheromatous irregularity. POSTERIOR CIRCULATION: Right V4 segment mildly irregular but patent without significant stenosis by MRA. Severe stenosis involving the intracranial left V4 segment with markedly attenuated flow. There remains some persistent antegrade flow to the vertebrobasilar junction. Both PICA origins remain patent and well perfused. 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 Basilar widely patent distally. Superior cerebellar arteries patent bilaterally. Right PCA supplied via the basilar as well as a small right posterior communicating artery. Severe stenosis at the distal right P2/P3 junction (series 1043, image 16). Right PCA patent distally. There is occlusion of the left PCA at the proximal left P2 segment. A small left PCOM remains patent. MRA NECK FINDINGS AORTIC ARCH: Visualized aortic arch normal in caliber. Bovine branching pattern noted. No stenosis about the origin of the great vessels. RIGHT CAROTID SYSTEM: Right CCA patent to the bifurcation. No significant atheromatous irregularity or stenosis about the right carotid bulb. Right ICA patent distally without stenosis, evidence for dissection or occlusion. LEFT CAROTID SYSTEM: Left common carotid artery patent from its origin to the region of the left carotid bulb. 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 Area of  signal loss measures 3.5 cm in craniocaudad dimension. Evaluation for potential intra stent stenosis limited by MRA, although robust flow seen within the ICA distal to the stent. No other stenosis, evidence for dissection or occlusion. VERTEBRAL ARTERIES: Both vertebral arteries arise from the subclavian arteries. Focal tortuosity with short-segment of approximately 50% stenosis at the proximal right subclavian artery,  proximal to the takeoff of the right vertebral artery (series 1009, image 11), similar to prior CTA. Relatively mild 30% atheromatous stenosis at the origins of the vertebral arteries not significantly changed. Vertebral arteries otherwise patent within the neck without stenosis, evidence for dissection or occlusion. IMPRESSION: MRI HEAD IMPRESSION: 1. 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 Mild patchy involvement at the left basal ganglia. No frank hemorrhagic transformation or significant regional mass effect. 2. Interval evolution of superimposed/underlying left PCA distribution infarct, now late subacute to chronic in appearance. Associated susceptibility artifact consistent with petechial blood products and/or laminar necrosis. MRA HEAD IMPRESSION: 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 2. 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 3. Short-segment severe mid left M1 stenosis, with short-segment mild to moderate distal right M1 stenosis. 4. Distal small vessel atheromatous irregularity throughout the intracranial circulation. MRA NECK IMPRESSION: 1. 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 Evaluation for potential intra stent stenosis limited by MRA, although robust flow seen within the ICA distal to the stent. 2.  Wide patency of the right carotid artery system without stenosis. 3. Relatively mild 30% atheromatous stenosis at the origins of the vertebral arteries. 4. Short-segment 50% stenosis involving the proximal right subclavian artery, proximal to the takeoff of the right vertebral artery. Electronically Signed   By: Jeannine Boga M.D.   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 EXAM: INTRAVSC STENT CERV CAROTID W/ EMB-PROT COMPARISON:  Diagnostic catheter arteriogram of May 02, 2020. MEDICATIONS: Heparin 3,000 units IA. Ancef 2 g IV antibiotic was administered within 1 hour of the procedure. ANESTHESIA/SEDATION: MAC anesthesia as per the department of Anesthesiology at [place]:  Isovue 300 approximately 90 mL. FLUOROSCOPY TIME:  Fluoroscopy Time: 41 minutes 0 seconds (1147 mGy). COMPLICATIONS: None immediate. TECHNIQUE: Informed written consent was obtained from the patient after a thorough discussion of the procedural risks, benefits and alternatives. All questions were addressed. Maximal Sterile Barrier Technique was utilized including caps, mask, sterile gowns, sterile gloves, sterile drape, hand hygiene and skin antiseptic. A timeout was performed prior to the initiation of the procedure. The right groin was prepped and draped in the usual sterile fashion. Thereafter using modified Seldinger technique, transfemoral access into the right common femoral artery was obtained without difficulty. Over a 0.035 inch guidewire, an 8 French 5 cm Pinnacle sheath was inserted. Through this, and also over 0.035 inch guidewire, a 5 [place] JB 1 catheter was advanced to the aortic arch region and selectively positioned in left common carotid artery. FINDINGS: The left common carotid arteriogram demonstrates the left external carotid artery and its  major branches to be widely patent. The left internal carotid artery just distal to the bulb has a severe high-grade stenosis secondary to a smooth arteriosclerotic plaque. No evidence of ulcerations or of intraluminal filling defects noted. The vessel is, otherwise, seen to opacify to the cranial skull base. The petrous, cavernous and supraclinoid segments are widely patent. The left middle cerebral artery opacifies into the capillary and venous phases. Again demonstrated is approximately 50-60% stenosis of the mid M1 segment. The left anterior cerebral artery opacifies  into the capillary and venous phases. ENDOVASCULAR REVASCULARIZATION OF SEVERELY STENOTIC LEFT INTERNAL CAROTID ARTERY PROXIMALLY WITH DISTAL PROTECTION The diagnostic JB 1 catheter in the left common carotid artery was exchanged over a 0.035 inch 300 cm Rosen exchange guidewire for a 90 cm 6 [place] Cook shuttle sheath. The guidewire was removed. Good aspiration obtained from the hub of the [facility] shuttle sheath. A gentle control arteriogram performed through the sheath demonstrated no evidence of spasms, dissections or of intraluminal filling defects. Measurements were then performed of the left internal carotid artery its most normal segment just distal to the stenosis, and also at the cervical petrous junction. The left common carotid artery distally was also evaluated in the AP and lateral projections. A 4/7 French NAV Emboshield filter was then prepped and purged with heparinized saline infusion in its housing. After clearing the air antegradely and retrogradely, the filter device was captured into the microcatheter delivery system. The combination of the 0.014 inch wire, and the delivery catheter was retrieved. A J configuration was given to the distal end of the 0.014 inch micro guidewire. Using biplane roadmap technique and constant fluoroscopic guidance in a coaxial manner and with constant heparinized saline infusion, the combination of the  filter delivery system was then advanced to just distal to the [place] shuttle sheath. Using a torque device, the wire was advanced with the delivery microcatheter across the high-grade stenosis without event. The combination was advanced to the distal cervical segment at the cranial skull base. The wire was in the horizontal petrous segment. The Emboshield was then delivered in the usual manner by retrieving the delivery microcatheter. This was then removed maintaining stability at the distal end of the micro guidewire. A control arteriogram performed through the [facility] shuttle sheath demonstrated no evidence of spasms, dissections or of the filling defects. A 6-8 mm x 40 mm Xact stent delivery system was then prepped and purged retrogradely with heparinized saline infusion. Using the rapid exchange technique, the stent delivery system was then advanced to the severe stenosis. The distal and proximal markers were then positioned adequate distance from the severe stenosis. Stent was then deployed in the usual manner without difficulty. The delivery apparatus was then retrieved and removed. A control arteriogram performed through the [facility] shuttle sheath demonstrated excellent apposition proximally and distally with modestly improved caliber of the previously noted high-grade stenosis. A 5 mm x 30 mm 0.014 Viatrac balloon was then prepped and purged anteriorly and retrogradely. Using the rapid exchange technique, this was again advanced to the left internal carotid artery proximally within the stent. The proximal and distal markers were aligned. A control inflation was then performed with a micro inflation syringe device via micro tubing slowly to 9.1 atmospheres where it was maintained for approximately 1 minute. The balloon was deflated and retrieved and removed maintaining safe position of the tip of the distal Emboshield. A control arteriogram performed through the [facility] shuttle sheath demonstrated excellent  apposition and nearly complete obliteration of the previously noted stenosis. More distally intracranially the distal internal carotid artery, and the left middle and left anterior cerebral artery demonstrated wide patency with again the left middle cerebral artery approximately 50-60% stenosis more regularly and clearly visible. An Emboshield capture microcatheter was then advanced again using the rapid exchange technique into the proximal marker on the Emboshield. This was then retrieved into the capture microcatheter by retrieving the micro guidewire. The combination was then retrieved under constant observation without any entanglement within the stent.  Control arteriograms were then performed at 10, 20 and 30 minutes post stent angioplasty. These continued to demonstrate no evidence of intra stent filling defects or irregularities. However, patient was given a total of 3 mg of intra-arterial Integrilin in order to prevent platelet aggregation within the stent. Final control arteriogram performed through the [facility] shuttle sheath in the left common carotid artery continued to demonstrate excellent apposition of flow proximally within the stent, with no change intracranially in terms of intraluminal filling defects or of vessel occlusions. The [facility] shuttle sheath was then removed. 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 Following the manual compression, a small hematoma was noted which was flattened with manual compression. At the end of this, the groin puncture site appeared soft without any clinical evidence of pain. Distal pulses remained Dopplerable in both feet unchanged. The patient was then transferred to the PACU and then neuro ICU for overnight close neurologic observations, maintaining controlled blood pressure, and IV heparin. Overnight, the patient remained stable. The following morning, the IV  heparin was stopped and the patient was switched to aspirin 81 mg a day, and 75 mg. An ultrasound of the right groin obtained the day before right after the procedure, and the following morning continued to demonstrate stable hematoma in the right groin without evidence of pseudo aneurysm. Patient was then mobilized with help. He was able to take in a normal meal without event. 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 Neurologically, otherwise, continued to have difficulty with word finding though slightly improved according to the patient. The patient did have visual apraxia being unable to read correct time on the clock on the wall. Patient was then discharged home under the care of his daughter. Of importance was the patient maintaining adequate hydration, and to avoid heavy stooping, bending or lifting weights above 10 pounds for approximately 14 days. Patient was advised not to drive either. Also the patient was asked to avoid heavy lifting and climbing up and down staircases. He was asked to maintained close monitoring of the right groin. Should he develop further bruising, swelling, or pain with weight-bearing he was asked to report to the ER. Patient and the daughter expressed understanding and agreement with the above management plan. IMPRESSION: Status post endovascular revascularization of severely stenotic symptomatic left internal carotid artery stenosis with distal protection with stent assisted angioplasty as described. PLAN: Follow-up in the clinic 2 weeks post discharge. Patient to take aspirin 81 mg a day, and Plavix 37.5 mg per day. Electronically Signed   By: Luanne Bras M.D.   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 EXAM: INTRAVSC STENT CERV CAROTID W/ EMB-PROT COMPARISON:   Diagnostic catheter arteriogram of May 02, 2020. MEDICATIONS: Heparin 3,000 units IA. Ancef 2 g IV antibiotic was administered within 1 hour of the procedure. ANESTHESIA/SEDATION: MAC anesthesia as per the department of Anesthesiology at [place]:  Isovue 300 approximately 90 mL. FLUOROSCOPY TIME:  Fluoroscopy Time: 41 minutes 0 seconds (1147 mGy). COMPLICATIONS: None immediate. TECHNIQUE: Informed written consent was obtained from the patient after a thorough discussion of the procedural risks, benefits and alternatives. All questions were addressed. Maximal Sterile Barrier Technique was utilized including caps, mask, sterile gowns, sterile gloves, sterile drape, hand  hygiene and skin antiseptic. A timeout was performed prior to the initiation of the procedure. The right groin was prepped and draped in the usual sterile fashion. Thereafter using modified Seldinger technique, transfemoral access into the right common femoral artery was obtained without difficulty. Over a 0.035 inch guidewire, an 8 French 5 cm Pinnacle sheath was inserted. Through this, and also over 0.035 inch guidewire, a 5 [place] JB 1 catheter was advanced to the aortic arch region and selectively positioned in left common carotid artery. FINDINGS: The left common carotid arteriogram demonstrates the left external carotid artery and its major branches to be widely patent. The left internal carotid artery just distal to the bulb has a severe high-grade stenosis secondary to a smooth arteriosclerotic plaque. No evidence of ulcerations or of intraluminal filling defects noted. The vessel is, otherwise, seen to opacify to the cranial skull base. The petrous, cavernous and supraclinoid segments are widely patent. The left middle cerebral artery opacifies into the capillary and venous phases. Again demonstrated is approximately 50-60% stenosis of the mid M1 segment. The left anterior cerebral artery opacifies into the capillary  and venous phases. ENDOVASCULAR REVASCULARIZATION OF SEVERELY STENOTIC LEFT INTERNAL CAROTID ARTERY PROXIMALLY WITH DISTAL PROTECTION The diagnostic JB 1 catheter in the left common carotid artery was exchanged over a 0.035 inch 300 cm Rosen exchange guidewire for a 90 cm 6 [place] Cook shuttle sheath. The guidewire was removed. Good aspiration obtained from the hub of the [facility] shuttle sheath. A gentle control arteriogram performed through the sheath demonstrated no evidence of spasms, dissections or of intraluminal filling defects. Measurements were then performed of the left internal carotid artery its most normal segment just distal to the stenosis, and also at the cervical petrous junction. The left common carotid artery distally was also evaluated in the AP and lateral projections. A 4/7 French NAV Emboshield filter was then prepped and purged with heparinized saline infusion in its housing. After clearing the air antegradely and retrogradely, the filter device was captured into the microcatheter delivery system. The combination of the 0.014 inch wire, and the delivery catheter was retrieved. A J configuration was given to the distal end of the 0.014 inch micro guidewire. Using biplane roadmap technique and constant fluoroscopic guidance in a coaxial manner and with constant heparinized saline infusion, the combination of the filter delivery system was then advanced to just distal to the [place] shuttle sheath. Using a torque device, the wire was advanced with the delivery microcatheter across the high-grade stenosis without event. The combination was advanced to the distal cervical segment at the cranial skull base. The wire was in the horizontal petrous segment. The Emboshield was then delivered in the usual manner by retrieving the delivery microcatheter. This was then removed maintaining stability at the distal end of the micro guidewire. A control arteriogram performed through the [facility] shuttle sheath  demonstrated no evidence of spasms, dissections or of the filling defects. A 6-8 mm x 40 mm Xact stent delivery system was then prepped and purged retrogradely with heparinized saline infusion. Using the rapid exchange technique, the stent delivery system was then advanced to the severe stenosis. The distal and proximal markers were then positioned adequate distance from the severe stenosis. Stent was then deployed in the usual manner without difficulty. The delivery apparatus was then retrieved and removed. A control arteriogram performed through the [facility] shuttle sheath demonstrated excellent apposition proximally and distally with modestly improved caliber of the previously noted high-grade stenosis. A 5 mm  x 30 mm 0.014 Viatrac balloon was then prepped and purged anteriorly and retrogradely. Using the rapid exchange technique, this was again advanced to the left internal carotid artery proximally within the stent. The proximal and distal markers were aligned. A control inflation was then performed with a micro inflation syringe device via micro tubing slowly to 9.1 atmospheres where it was maintained for approximately 1 minute. The balloon was deflated and retrieved and removed maintaining safe position of the tip of the distal Emboshield. A control arteriogram performed through the [facility] shuttle sheath demonstrated excellent apposition and nearly complete obliteration of the previously noted stenosis. More distally intracranially the distal internal carotid artery, and the left middle and left anterior cerebral artery demonstrated wide patency with again the left middle cerebral artery approximately 50-60% stenosis more regularly and clearly visible. An Emboshield capture microcatheter was then advanced again using the rapid exchange technique into the proximal marker on the Emboshield. This was then retrieved into the capture microcatheter by retrieving the micro guidewire. The combination was then retrieved  under constant observation without any entanglement within the stent. Control arteriograms were then performed at 10, 20 and 30 minutes post stent angioplasty. These continued to demonstrate no evidence of intra stent filling defects or irregularities. However, patient was given a total of 3 mg of intra-arterial Integrilin in order to prevent platelet aggregation within the stent. Final control arteriogram performed through the [facility] shuttle sheath in the left common carotid artery continued to demonstrate excellent apposition of flow proximally within the stent, with no change intracranially in terms of intraluminal filling defects or of vessel occlusions. The [facility] shuttle sheath was then removed. 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 Following the manual compression, a small hematoma was noted which was flattened with manual compression. At the end of this, the groin puncture site appeared soft without any clinical evidence of pain. Distal pulses remained Dopplerable in both feet unchanged. The patient was then transferred to the PACU and then neuro ICU for overnight close neurologic observations, maintaining controlled blood pressure, and IV heparin. Overnight, the patient remained stable. The following morning, the IV heparin was stopped and the patient was switched to aspirin 81 mg a day, and 75 mg. An ultrasound of the right groin obtained the day before right after the procedure, and the following morning continued to demonstrate stable hematoma in the right groin without evidence of pseudo aneurysm. Patient was then mobilized with help. He was able to take in a normal meal without event. 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 Neurologically, otherwise, continued to have difficulty with word finding though slightly improved  according to the patient. The patient did have visual apraxia being unable to read correct time on the clock on the wall. Patient was then discharged home under the care of his daughter. Of importance was the patient maintaining adequate hydration, and to avoid heavy stooping, bending or lifting weights above 10 pounds for approximately 14 days. Patient was advised not to drive either. Also the patient was asked to avoid heavy lifting and climbing up and down staircases. He was asked to maintained close monitoring of the right groin. Should he develop further bruising, swelling, or pain with weight-bearing he was asked to report to the ER. Patient and the daughter expressed understanding and agreement with the above management plan. IMPRESSION: Status post endovascular revascularization  of severely stenotic symptomatic left internal carotid artery stenosis with distal protection with stent assisted angioplasty as described. PLAN: Follow-up in the clinic 2 weeks post discharge. Patient to take aspirin 81 mg a day, and Plavix 37.5 mg per day. Electronically Signed   By: Luanne Bras M.D.   On: 05/22/2020 10:00   DG CHEST PORT 1 VIEW  Result Date: 05/31/2020 CLINICAL DATA:  Screening for metal prior to MRI. EXAM: PORTABLE CHEST 1 VIEW COMPARISON:  None. FINDINGS: Overlying monitoring devices in place. Lung volumes are low. No implanted medical device or radiopaque foreign body in the chest. Normal heart size. Aortic atherosclerosis and tortuosity. No pleural effusion or pneumothorax. No focal airspace disease. No acute osseous abnormalities are seen. IMPRESSION: 1. No implanted medical device or radiopaque foreign body in the chest to preclude MRI imaging. 2. Low lung volumes without acute abnormality. Electronically Signed   By: Keith Rake M.D.   On: 05/31/2020 23:38   DG Abd Portable 1V  Result Date: 05/31/2020 CLINICAL DATA:  Screening for metal prior to MRI. EXAM: PORTABLE ABDOMEN - 1 VIEW  COMPARISON:  None. FINDINGS: Overlying monitoring devices in place. No implanted medical device or radiopaque foreign body to preclude MRI imaging. Normal bowel gas pattern. Advanced vascular calcifications. No radiopaque calculi. No acute osseous abnormalities are seen. IMPRESSION: No implanted medical device or radiopaque foreign body to preclude MRI imaging. Electronically Signed   By: Keith Rake M.D.   On: 05/31/2020 23:40   VAS [place] GROIN PSEUDOANEURYSM  Result Date: 05/21/2020  ARTERIAL PSEUDOANEURYSM  Patient Name:  [name]  Date of Exam:   05/21/2020 Medical Rec #: [id number]         Accession #:    [id number] Date of Birth: 06-19-1950        Patient Gender: M Patient Age:   69Y Exam Location:  [facility] Procedure:      VAS [place] GROIN PSEUDOANEURYSM Referring Phys: [id number] Ronney Lion --------------------------------------------------------------------------------  Exam: Right groin History: S/p catheterization S/P left ICA stent via right femoral. Performing Technologist: Oda Cogan RDMS, RVT  Examination Guidelines: A complete evaluation includes B-mode imaging, spectral Doppler, color Doppler, and power Doppler as needed of all accessible portions of each vessel. Bilateral testing is considered an integral part of a complete examination. Limited examinations for reoccurring indications may be performed as noted.  Findings: A mixed echogenic structure measuring approximately 1.0 cm x 1.3 cm is visualized at the Right groin with ultrasound characteristics of a hematoma. There is no significant change in this exam when compared to prior exam.  Summary: No evidence of a definitive pseudoaneurysm or AVF. A small hematoma was noted in the right groin. Diagnosing physician: Curt Jews MD Electronically signed by Curt Jews MD on 05/21/2020 at 55:16:05 PM.   --------------------------------------------------------------------------------    Final    VAS [place] GROIN  PSEUDOANEURYSM  Result Date: 05/20/2020  ARTERIAL PSEUDOANEURYSM  Patient Name:  [name]  Date of Exam:   05/20/2020 Medical Rec #: [id number]         Accession #:    [id number] Date of Birth: October 17, 1950        Patient Gender: M Patient Age:   69Y Exam Location:  [facility] Procedure:      VAS [place] Gloriajean Dell Referring Phys: [id number] [facility] SUE-ELLEN MATTHEWS --------------------------------------------------------------------------------  Exam: Right groin Indications: Patient complains of bruising, hematoma vs. pseudoaneurysm. History: S/p catheterization. Comparison Study: No prior studies. Performing Technologist: Darlin Coco  RDMS,RVT  Examination Guidelines: A complete evaluation includes B-mode imaging, spectral Doppler, color Doppler, and power Doppler as needed of all accessible portions of each vessel. Bilateral testing is considered an integral part of a complete examination. Limited examinations for reoccurring indications may be performed as noted. +------------+----------+----------+------+----------+ Right DuplexPSV (cm/s) Waveform PlaqueComment(s) +------------+----------+----------+------+----------+ CFA             72    monophasic                 +------------+----------+----------+------+----------+ PFA             83    monophasic                 +------------+----------+----------+------+----------+ Prox SFA        66    monophasic                 +------------+----------+----------+------+----------+ Right Vein comments: Patent, WNL  Findings: Appearance of residual neck arising from the common femoral artery. The neck measures approximately 0.5 cm wide and 1.5 cm long. No evidence of hematoma or pseudoaneurysm.  Diagnosing physician: Ruta Hinds MD Electronically signed by Ruta Hinds MD on 05/20/2020 at 2:28:42 PM.   --------------------------------------------------------------------------------    Final    VAS US CAROTID  Result Date:  06/05/2020 Carotid Arterial Duplex Study Patient Name:  [name]  Date of Exam:   06/01/2020 Medical Rec #: [id number]         Accession #:    [id number] Date of Birth: [birth year]        Patient Gender: M Patient Age:   69Y Exam Location:  [facility] Procedure:      VAS US CAROTID Referring Phys: [id number] [facility] --------------------------------------------------------------------------------  Indications:       CVA (left PCA/MCA stroke) and confusion. MRA of neck shows                    normal right ICA and abrupt signal loss at the left ICA bulb                    through the proximal ICA. Risk Factors:      Hypertension, past history of smoking, prior MI, coronary                    artery disease. Other Factors:     Status post angio and left carotid stenting 10 days ago. Comparison Study:  No prior study Performing Technologist: Sharion Dove RVS  Examination Guidelines: A complete evaluation includes B-mode imaging, spectral Doppler, color Doppler, and power Doppler as needed of all accessible portions of each vessel. Bilateral testing is considered an integral part of a complete examination. Limited examinations for reoccurring indications may be performed as noted.  Right Carotid Findings: +----------+--------+--------+--------+--------------------------+---------+           PSV cm/sEDV cm/sStenosisPlaque Description        Comments  +----------+--------+--------+--------+--------------------------+---------+ CCA Prox  108     20                                                  +----------+--------+--------+--------+--------------------------+---------+ CCA Distal121     22                                                  +----------+--------+--------+--------+--------------------------+---------+  ICA Prox  134     44      40-59%  heterogenous and irregularShadowing +----------+--------+--------+--------+--------------------------+---------+ ICA Mid    92      29                                                  +----------+--------+--------+--------+--------------------------+---------+ ICA Distal124     35                                                  +----------+--------+--------+--------+--------------------------+---------+ ECA       159     24                                                  +----------+--------+--------+--------+--------------------------+---------+ +----------+--------+-------+--------+-------------------+           PSV cm/sEDV cmsDescribeArm Pressure (mmHG) +----------+--------+-------+--------+-------------------+ [id number]                                         +----------+--------+-------+--------+-------------------+ +---------+--------+--+--------+--+ VertebralPSV cm/s50EDV cm/s19 +---------+--------+--+--------+--+  Left Carotid Findings: +----------+--------+--------+--------+----------------------+--------+           PSV cm/sEDV cm/sStenosisPlaque Description    Comments +----------+--------+--------+--------+----------------------+--------+ CCA Prox  78      14              heterogenous                   +----------+--------+--------+--------+----------------------+--------+ CCA Distal75      18              heterogenous                   +----------+--------+--------+--------+----------------------+--------+ ICA Prox  68      18              calcific and irregularstent    +----------+--------+--------+--------+----------------------+--------+ ICA Mid   82      22                                             +----------+--------+--------+--------+----------------------+--------+ ICA Distal86      29                                             +----------+--------+--------+--------+----------------------+--------+ ECA       114     12                                              +----------+--------+--------+--------+----------------------+--------+ +----------+--------+--------+--------+-------------------+           PSV cm/sEDV cm/sDescribeArm Pressure (mmHG) +----------+--------+--------+--------+-------------------+ Subclavian127                                         +----------+--------+--------+--------+-------------------+ +---------+--------+--+--------+-+  VertebralPSV cm/s30EDV cm/s5 +---------+--------+--+--------+-+  Left Stent(s): +---------------+--+--+-------------++---------------+ Prox to Stent  9315<50% stenosis                +---------------+--+--+-------------++---------------+ Proximal Stent 6818<50% stenosis                +---------------+--+--+-------------++---------------+ Mid Stent      8222<50% stenosisShadowing noted +---------------+--+--+-------------++---------------+ Distal Stent   8527<50% stenosis                +---------------+--+--+-------------++---------------+ Distal to Stent8629<50% stenosis                +---------------+--+--+-------------++---------------+ Shadowing noted proximal to mid stent. Normal waveforms noted pre and post shadowing.   Summary: Right Carotid: Velocities in the right ICA are consistent with a 40-59%                stenosis. Left Carotid: Velocities in the left ICA are consistent with a 1-39% stenosis.               <50% stenosis noted in left carotid, post stenting. Shadowing               noted proximal to mid stent. Normal waveforms noted pre and post               shadowing. Vertebrals:  Bilateral vertebral arteries demonstrate antegrade flow. Subclavians: Normal flow hemodynamics were seen in bilateral subclavian              arteries. *See table(s) above for measurements and observations.   Electronically signed by Antony Contras MD on 06/05/2020 at 8:16:44 AM.    Final    IR ANGIO VERTEBRAL SEL SUBCLAVIAN INNOMINATE UNI R MOD SED  Result Date: 05/23/2020 CLINICAL  DATA:  History of symptomatic severe left internal carotid artery proximal stenosis. EXAM: INTRAVSC STENT CERV CAROTID W/ EMB-PROT COMPARISON:  Diagnostic catheter arteriogram of May 02, 2020. MEDICATIONS: Heparin 3,000 units IA. Ancef 2 g IV antibiotic was administered within 1 hour of the procedure. ANESTHESIA/SEDATION: MAC anesthesia as per the department of Anesthesiology at [place]:  Isovue 300 approximately 90 mL. FLUOROSCOPY TIME:  Fluoroscopy Time: 41 minutes 0 seconds (1147 mGy). COMPLICATIONS: None immediate. TECHNIQUE: Informed written consent was obtained from the patient after a thorough discussion of the procedural risks, benefits and alternatives. All questions were addressed. Maximal Sterile Barrier Technique was utilized including caps, mask, sterile gowns, sterile gloves, sterile drape, hand hygiene and skin antiseptic. A timeout was performed prior to the initiation of the procedure. The right groin was prepped and draped in the usual sterile fashion. Thereafter using modified Seldinger technique, transfemoral access into the right common femoral artery was obtained without difficulty. Over a 0.035 inch guidewire, an 8 French 5 cm Pinnacle sheath was inserted. Through this, and also over 0.035 inch guidewire, a 5 [place] JB 1 catheter was advanced to the aortic arch region and selectively positioned in left common carotid artery. FINDINGS: The left common carotid arteriogram demonstrates the left external carotid artery and its major branches to be widely patent. The left internal carotid artery just distal to the bulb has a severe high-grade stenosis secondary to a smooth arteriosclerotic plaque. No evidence of ulcerations or of intraluminal filling defects noted. The vessel is, otherwise, seen to opacify to the cranial skull base. The petrous, cavernous and supraclinoid segments are widely patent. The left middle cerebral artery opacifies into the capillary and venous  phases. Again demonstrated is approximately 50-60% stenosis of the mid M1 segment.  The left anterior cerebral artery opacifies into the capillary and venous phases. ENDOVASCULAR REVASCULARIZATION OF SEVERELY STENOTIC LEFT INTERNAL CAROTID ARTERY PROXIMALLY WITH DISTAL PROTECTION The diagnostic JB 1 catheter in the left common carotid artery was exchanged over a 0.035 inch 300 cm Rosen exchange guidewire for a 90 cm 6 [place] Cook shuttle sheath. The guidewire was removed. Good aspiration obtained from the hub of the [facility] shuttle sheath. A gentle control arteriogram performed through the sheath demonstrated no evidence of spasms, dissections or of intraluminal filling defects. Measurements were then performed of the left internal carotid artery its most normal segment just distal to the stenosis, and also at the cervical petrous junction. The left common carotid artery distally was also evaluated in the AP and lateral projections. A 4/7 French NAV Emboshield filter was then prepped and purged with heparinized saline infusion in its housing. After clearing the air antegradely and retrogradely, the filter device was captured into the microcatheter delivery system. The combination of the 0.014 inch wire, and the delivery catheter was retrieved. A J configuration was given to the distal end of the 0.014 inch micro guidewire. Using biplane roadmap technique and constant fluoroscopic guidance in a coaxial manner and with constant heparinized saline infusion, the combination of the filter delivery system was then advanced to just distal to the [place] shuttle sheath. Using a torque device, the wire was advanced with the delivery microcatheter across the high-grade stenosis without event. The combination was advanced to the distal cervical segment at the cranial skull base. The wire was in the horizontal petrous segment. The Emboshield was then delivered in the usual manner by retrieving the delivery microcatheter. This  was then removed maintaining stability at the distal end of the micro guidewire. A control arteriogram performed through the [facility] shuttle sheath demonstrated no evidence of spasms, dissections or of the filling defects. A 6-8 mm x 40 mm Xact stent delivery system was then prepped and purged retrogradely with heparinized saline infusion. Using the rapid exchange technique, the stent delivery system was then advanced to the severe stenosis. The distal and proximal markers were then positioned adequate distance from the severe stenosis. Stent was then deployed in the usual manner without difficulty. The delivery apparatus was then retrieved and removed. A control arteriogram performed through the [facility] shuttle sheath demonstrated excellent apposition proximally and distally with modestly improved caliber of the previously noted high-grade stenosis. A 5 mm x 30 mm 0.014 Viatrac balloon was then prepped and purged anteriorly and retrogradely. Using the rapid exchange technique, this was again advanced to the left internal carotid artery proximally within the stent. The proximal and distal markers were aligned. A control inflation was then performed with a micro inflation syringe device via micro tubing slowly to 9.1 atmospheres where it was maintained for approximately 1 minute. The balloon was deflated and retrieved and removed maintaining safe position of the tip of the distal Emboshield. A control arteriogram performed through the [facility] shuttle sheath demonstrated excellent apposition and nearly complete obliteration of the previously noted stenosis. More distally intracranially the distal internal carotid artery, and the left middle and left anterior cerebral artery demonstrated wide patency with again the left middle cerebral artery approximately 50-60% stenosis more regularly and clearly visible. An Emboshield capture microcatheter was then advanced again using the rapid exchange technique into the proximal marker on  the Emboshield. This was then retrieved into the capture microcatheter by retrieving the micro guidewire. The combination was then retrieved under constant observation  without any entanglement within the stent. Control arteriograms were then performed at 10, 20 and 30 minutes post stent angioplasty. These continued to demonstrate no evidence of intra stent filling defects or irregularities. However, patient was given a total of 3 mg of intra-arterial Integrilin in order to prevent platelet aggregation within the stent. Final control arteriogram performed through the [facility] shuttle sheath in the left common carotid artery continued to demonstrate excellent apposition of flow proximally within the stent, with no change intracranially in terms of intraluminal filling defects or of vessel occlusions. The [facility] shuttle sheath was then removed. 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 Following the manual compression, a small hematoma was noted which was flattened with manual compression. At the end of this, the groin puncture site appeared soft without any clinical evidence of pain. Distal pulses remained Dopplerable in both feet unchanged. The patient was then transferred to the PACU and then neuro ICU for overnight close neurologic observations, maintaining controlled blood pressure, and IV heparin. Overnight, the patient remained stable. The following morning, the IV heparin was stopped and the patient was switched to aspirin 81 mg a day, and 75 mg. An ultrasound of the right groin obtained the day before right after the procedure, and the following morning continued to demonstrate stable hematoma in the right groin without evidence of pseudo aneurysm. Patient was then mobilized with help. He was able to take in a normal meal without event. Clinically, the patient continued to demonstrated a right homonymous hemianopsia  related to previous ischemic infarct. Neurologically, otherwise, continued to have difficulty with word finding though slightly improved according to the patient. The patient did have visual apraxia being unable to read correct time on the clock on the wall. Patient was then discharged home under the care of his daughter. Of importance was the patient maintaining adequate hydration, and to avoid heavy stooping, bending or lifting weights above 10 pounds for approximately 14 days. Patient was advised not to drive either. Also the patient was asked to avoid heavy lifting and climbing up and down staircases. He was asked to maintained close monitoring of the right groin. Should he develop further bruising, swelling, or pain with weight-bearing he was asked to report to the ER. Patient and the daughter expressed understanding and agreement with the above management plan. IMPRESSION: Status post endovascular revascularization of severely stenotic symptomatic left internal carotid artery stenosis with distal protection with stent assisted angioplasty as described. PLAN: Follow-up in the clinic 2 weeks post discharge. Patient to take aspirin 81 mg a day, and Plavix 37.5 mg per day. Electronically Signed   By: Luanne Bras M.D.   On: 05/22/2020 10:00    Microbiology: Recent Results (from the past 240 hour(s))  Resp Panel by RT-PCR (Flu A&B, Covid)     Status: None   Collection Time: 05/31/20  3:34 PM  Result Value Ref Range Status   SARS Coronavirus 2 by RT PCR NEGATIVE NEGATIVE Final    Comment: (NOTE) SARS-CoV-2 target nucleic acids are NOT DETECTED.  The SARS-CoV-2 RNA is generally detectable in upper respiratory specimens during the acute phase of infection. The lowest concentration of SARS-CoV-2 viral copies this assay can detect is 138 copies/mL. A negative result does not preclude SARS-Cov-2 infection and should not be used as the sole basis for treatment or other patient management decisions.  A negative result may occur with  improper specimen collection/handling,  submission of specimen other than nasopharyngeal swab, presence of viral mutation(s) within the areas targeted by this assay, and inadequate number of viral copies(<138 copies/mL). A negative result must be combined with clinical observations, patient history, and epidemiological information. The expected result is Negative.  Fact Sheet for Patients:  EntrepreneurPulse.com.au  Fact Sheet for Healthcare Providers:  IncredibleEmployment.be  This test is no t yet approved or cleared by the [place] FDA and  has been authorized for detection and/or diagnosis of SARS-CoV-2 by FDA under an Emergency Use Authorization (EUA). This EUA will remain  in effect (meaning this test can be used) for the duration of the COVID-19 declaration under Section 564(b)(1) of the Act, 21 U.S.C.section 360bbb-3(b)(1), unless the authorization is terminated  or revoked sooner.       Influenza A by PCR NEGATIVE NEGATIVE Final   Influenza B by PCR NEGATIVE NEGATIVE Final    Comment: (NOTE) The Xpert Xpress SARS-CoV-2/FLU/RSV plus assay is intended as an aid in the diagnosis of influenza from Nasopharyngeal swab specimens and should not be used as a sole basis for treatment. Nasal washings and aspirates are unacceptable for Xpert Xpress SARS-CoV-2/FLU/RSV testing.  Fact Sheet for Patients: EntrepreneurPulse.com.au  Fact Sheet for Healthcare Providers: IncredibleEmployment.be  This test is not yet approved or cleared by the [place] FDA and has been authorized for detection and/or diagnosis of SARS-CoV-2 by FDA under an Emergency Use Authorization (EUA). This EUA will remain in effect (meaning this test can be used) for the duration of the COVID-19 declaration under Section 564(b)(1) of the Act, 21 U.S.C. section 360bbb-3(b)(1), unless the authorization  is terminated or revoked.  Performed at [facility], [place] [address], [place], [place] [address]   Resp Panel by RT-PCR (Flu A&B, Covid) Nasopharyngeal Swab     Status: None   Collection Time: 06/07/20 10:25 AM   Specimen: Nasopharyngeal Swab; Nasopharyngeal(NP) swabs in vial transport medium  Result Value Ref Range Status   SARS Coronavirus 2 by RT PCR NEGATIVE NEGATIVE Final    Comment: (NOTE) SARS-CoV-2 target nucleic acids are NOT DETECTED.  The SARS-CoV-2 RNA is generally detectable in upper respiratory specimens during the acute phase of infection. The lowest concentration of SARS-CoV-2 viral copies this assay can detect is 138 copies/mL. A negative result does not preclude SARS-Cov-2 infection and should not be used as the sole basis for treatment or other patient management decisions. A negative result may occur with  improper specimen collection/handling, submission of specimen other than nasopharyngeal swab, presence of viral mutation(s) within the areas targeted by this assay, and inadequate number of viral copies(<138 copies/mL). A negative result must be combined with clinical observations, patient history, and epidemiological information. The expected result is Negative.  Fact Sheet for Patients:  EntrepreneurPulse.com.au  Fact Sheet for Healthcare Providers:  IncredibleEmployment.be  This test is no t yet approved or cleared by the [place] FDA and  has been authorized for detection and/or diagnosis of SARS-CoV-2 by FDA under an Emergency Use Authorization (EUA). This EUA will remain  in effect (meaning this test can be used) for the duration of the COVID-19 declaration under Section 564(b)(1) of the Act, 21 U.S.C.section 360bbb-3(b)(1), unless the authorization is terminated  or revoked sooner.       Influenza A by PCR NEGATIVE NEGATIVE Final   Influenza B by PCR NEGATIVE NEGATIVE Final    Comment: (NOTE) The  Xpert Xpress SARS-CoV-2/FLU/RSV plus assay is intended as an aid in the diagnosis of influenza  from Nasopharyngeal swab specimens and should not be used as a sole basis for treatment. Nasal washings and aspirates are unacceptable for Xpert Xpress SARS-CoV-2/FLU/RSV testing.  Fact Sheet for Patients: EntrepreneurPulse.com.au  Fact Sheet for Healthcare Providers: IncredibleEmployment.be  This test is not yet approved or cleared by the [place] FDA and has been authorized for detection and/or diagnosis of SARS-CoV-2 by FDA under an Emergency Use Authorization (EUA). This EUA will remain in effect (meaning this test can be used) for the duration of the COVID-19 declaration under Section 564(b)(1) of the Act, 21 U.S.C. section 360bbb-3(b)(1), unless the authorization is terminated or revoked.  Performed at [facility], [place] [address] [place], [place] [address]      Labs: CBC: Recent Labs  Lab 05/31/20 1411 06/01/20 0231 06/02/20 0425 06/04/20 0021 06/05/20 0419 06/06/20 0408  WBC 8.5 6.5 6.3 6.5 6.6 7.5  NEUTROABS 6.7  --   --  4.1 4.5  --   HGB 10.8* 9.9* 10.2* 9.5* 9.3* 10.1*  HCT 33.0* 30.3* 31.0* 28.2* 29.1* 30.4*  MCV 99.1 97.4 98.1 96.9 101.7* 98.1  PLT 364 371 284 252 232 505   Basic Metabolic Panel: Recent Labs  Lab 05/31/20 1411 06/01/20 0231 06/02/20 0425 06/05/20 0419 06/07/20 0438  NA 135 136 137 139  --   K 4.0 3.5 3.9 3.7  --   CL 99 102 103 106  --   CO2 _0 --   GLUCOSE 111* 109* 106* 116*  --   BUN 35* 31* 20 13  --   CREATININE 1.52* 1.19 1.09 0.83 0.90  CALCIUM 10.5* 9.6 10.0 9.5  --    Liver Function Tests: Recent Labs  Lab 05/31/20 1411  AST 26  ALT 26  ALKPHOS 62  BILITOT 2.0*  PROT 7.3  ALBUMIN 4.1   CBG: Recent Labs  Lab 06/04/20 1434  GLUCAP 117*    Time spent: 35 minutes  Signed:  Berle Mull  [facility]  06/07/2020 2:07 PM

## 2020-06-12 ENCOUNTER — Other Ambulatory Visit: Payer: Self-pay | Admitting: Neurology

## 2020-06-18 ENCOUNTER — Telehealth: Payer: Self-pay | Admitting: Neurology

## 2020-06-18 NOTE — Telephone Encounter (Signed)
Pt needs a hospital f/u with dr. Anne Hahn per sarah/willis. Scheduled pt for 06/26/20 and informed daughter/lvm informing facility. Pt's daughter stated that they will be at appointment but would like to f/u with dr. Pearlean Brownie moving forward.

## 2020-06-26 ENCOUNTER — Ambulatory Visit (INDEPENDENT_AMBULATORY_CARE_PROVIDER_SITE_OTHER): Payer: Medicare HMO | Admitting: Neurology

## 2020-06-26 ENCOUNTER — Telehealth: Payer: Self-pay | Admitting: *Deleted

## 2020-06-26 ENCOUNTER — Ambulatory Visit (HOSPITAL_COMMUNITY)
Admission: RE | Admit: 2020-06-26 | Discharge: 2020-06-26 | Disposition: A | Discharge status: Home / Self Care | Payer: Medicare HMO | Source: Ambulatory Visit | Attending: Student | Admitting: Student

## 2020-06-26 ENCOUNTER — Encounter: Payer: Self-pay | Admitting: Neurology

## 2020-06-26 ENCOUNTER — Other Ambulatory Visit: Payer: Self-pay

## 2020-06-26 VITALS — BP 151/78 | HR 72 | Ht 70.0 in | Wt 191.0 lb

## 2020-06-26 DIAGNOSIS — I69359 Hemiplegia and hemiparesis following cerebral infarction affecting unspecified side: Secondary | ICD-10-CM | POA: Diagnosis not present

## 2020-06-26 DIAGNOSIS — I69328 Other speech and language deficits following cerebral infarction: Secondary | ICD-10-CM | POA: Insufficient documentation

## 2020-06-26 DIAGNOSIS — I6602 Occlusion and stenosis of left middle cerebral artery: Secondary | ICD-10-CM

## 2020-06-26 DIAGNOSIS — I63512 Cerebral infarction due to unspecified occlusion or stenosis of left middle cerebral artery: Secondary | ICD-10-CM | POA: Diagnosis not present

## 2020-06-26 HISTORY — DX: Other speech and language deficits following cerebral infarction: I69.328

## 2020-06-26 HISTORY — DX: Hemiplegia and hemiparesis following cerebral infarction affecting unspecified side: I69.359

## 2020-06-26 NOTE — Progress Notes (Signed)
Reason for visit: Stroke, aphasia  Referring physician: [facility]  [name] is a 70 y.o. male  History of present illness:  [name] is a 70 year old right-handed white male with a history of cerebrovascular disease.  The patient was seen through this office on 01 April 2020 following a stroke event that had occurred on 16 March 2023.  The patient presented with slurring of speech, aphasia, and headache.  The patient was found to have a left posterior cerebral artery distribution stroke.  CT angiogram showed high-grade stenosis of the left vertebral artery, and occlusion of the left posterior cerebral artery.  The patient was noted to have 60% stenosis of the left internal carotid artery, and no significant flow restricting lesions in the right internal carotid artery.  The patient however went on to have a cerebral angiogram on 03 May 2020 that showed evidence of 85% stenosis of the left internal carotid artery and 50% stenosis of the left middle cerebral artery and occlusion of the left posterior cerebral artery.  The patient was set up for a carotid artery stent that occurred on 20 May 2020.  The patient however came back into the hospital on 31 May 2020 with new onset of aphasia and decreased cognitive recall.  The patient was found to have extension of the left brain stroke to include the middle cerebral artery distribution as well.  The patient was found to have severe stenosis of the left middle cerebral artery, and eventually went on to have an angioplasty procedure for the left middle cerebral artery.  The patient has been placed on aspirin and Plavix.  He has some residual aphasia, and he continues to have a right homonymous visual field deficit that was present previously.  He is currently residing in [facility], he is getting some speech therapy.  His physical abilities however are quite good, he has no residual numbness or weakness of extremities, he is ambulating  without difficulty.  He is able to swallow without choking.  He returns here for further evaluation.  Past Medical History:  Diagnosis Date  . Altered mental status    [name]   . HLD (hyperlipidemia)   . Hypertension   . Myocardial infarction (HCC)    1990's  . Stroke [name]) 03/15/2020    Past Surgical History:  Procedure Laterality Date  . IR ANGIO INTRA EXTRACRAN SEL COM CAROTID INNOMINATE BILAT MOD SED  05/02/2020  . IR ANGIO INTRA EXTRACRAN SEL INTERNAL CAROTID UNI L MOD SED  06/04/2020  . IR ANGIO VERTEBRAL SEL SUBCLAVIAN INNOMINATE UNI L MOD SED  05/02/2020  . IR ANGIO VERTEBRAL SEL SUBCLAVIAN INNOMINATE UNI R MOD SED  05/20/2020  . IR ANGIO VERTEBRAL SEL VERTEBRAL UNI R MOD SED  05/02/2020  . IR INTRA CRAN STENT  06/04/2020  . IR INTRAVSC STENT CERV CAROTID W/EMB-PROT MOD SED INCL ANGIO  05/20/2020  . IR RADIOLOGIST EVAL & MGMT  03/27/2020  . IR US GUIDE VASC ACCESS RIGHT  05/02/2020  . IR US GUIDE VASC ACCESS RIGHT  05/20/2020  . IR US GUIDE VASC ACCESS RIGHT  06/04/2020  . RADIOLOGY WITH ANESTHESIA Left 05/20/2020   Procedure: RADIOLOGY WITH ANESTHESIA  LEFT ICA STENTING;  Surgeon: Julieanne Cotton, MD;  Location: [facility] OR;  Service: Radiology;  Laterality: Left;  . RADIOLOGY WITH ANESTHESIA Left 06/04/2020   Procedure: LEFT MIDDLE CAROTID  ARTERY STENT;  Surgeon: Julieanne Cotton, MD;  Location: [facility] OR;  Service: Radiology;  Laterality: Left;  Family History  Problem Relation Age of Onset  . Heart attack Father   . Stroke Paternal Great-grandfather     Social history:  reports that he quit smoking about 19 years ago. His smoking use included cigarettes. He has never used smokeless tobacco. He reports current alcohol use. He reports that he does not use drugs.  Medications:  Prior to Admission medications   Medication Sig Start Date End Date Taking? Authorizing Provider  acetaminophen (TYLENOL) 500 MG tablet Take 1,000 mg by mouth every 6 (six) hours as needed for  moderate pain or headache.   Yes [provider]  aspirin EC 325 MG EC tablet Take 1 tablet (325 mg total) by mouth daily. 06/08/20  Yes Rolly Salter, MD  atorvastatin (LIPITOR) 80 MG tablet Take 1 tablet (80 mg total) by mouth daily. 03/17/20  Yes Azucena Fallen, MD  b complex vitamins capsule Take 1 capsule by mouth daily.   Yes [provider]  clopidogrel (PLAVIX) 75 MG tablet Take 0.5 tablets (37.5 mg total) by mouth daily. 05/21/20 08/19/20 Yes Louk, Alexandra M, PA-C  lisinopril (ZESTRIL) 10 MG tablet Take 1 tablet (10 mg total) by mouth daily. 06/08/20  Yes Rolly Salter, MD  traZODone (DESYREL) 50 MG tablet Take 50 mg by mouth at bedtime as needed for sleep. 04/28/20  Yes [provider]  VITAMIN D-VITAMIN K PO Take 1 capsule by mouth daily.   Yes [provider]      Allergies  Allergen Reactions  . Ciprodex [Ciprofloxacin-Dexamethasone] Swelling    Ear canal swelling  . Metformin And Related Other (See Comments)    Upset stomach    ROS:  Out of a complete 14 system review of symptoms, the patient complains only of the following symptoms, and all other reviewed systems are negative.  Aphasia, recent stroke event  visual disturbance   Blood pressure (!) 151/78, pulse 72, height 5\' 10"  (1.778 m), weight 191 lb (86.6 kg).  Physical Exam  General: The patient is alert and cooperative at the time of the examination.  Eyes: Pupils are equal, round, and reactive to light. Discs are flat bilaterally.  Neck: The neck is supple, no carotid bruits are noted.  Respiratory: The respiratory examination is clear.  Cardiovascular: The cardiovascular examination reveals a regular rate and rhythm, no obvious murmurs or rubs are noted.  Skin: Extremities are without significant edema.  Neurologic Exam  Mental status: The patient is alert and oriented x 3 at the time of the examination. The patient has apparent normal recent and remote memory,  with an apparently normal attention span and concentration ability.  Cranial nerves: Facial symmetry is present. There is good sensation of the face to pinprick and soft touch bilaterally. The strength of the facial muscles and the muscles to head turning and shoulder shrug are normal bilaterally. Speech is well enunciated, no aphasia or dysarthria is noted. Extraocular movements are full. Visual fields are notable for a right homonymous visual field deficit. The tongue is midline, and the patient has symmetric elevation of the soft palate. No obvious hearing deficits are noted.  Motor: The motor testing reveals 5 over 5 strength of all 4 extremities. Good symmetric motor tone is noted throughout.  Sensory: Sensory testing is intact to pinprick, soft touch, vibration sensation, and position sense on all 4 extremities. No evidence of extinction is noted.  Coordination: Cerebellar testing reveals good finger-nose-finger and heel-to-shin bilaterally, but the patient has some degree of apraxia  with performing these tasks.  Gait and station: Gait is normal. Tandem gait is unsteady. Romberg is negative. No drift is seen.  Reflexes: Deep tendon reflexes are symmetric and normal bilaterally, with exception of some elevation of the left biceps reflex.  The right. Toes are downgoing bilaterally.   MRI brain and MRA of head and neck 06/01/20:  IMPRESSION: MRI HEAD IMPRESSION:  1. 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 Mild patchy involvement at the left basal ganglia. No frank hemorrhagic transformation or significant regional mass effect. 2. Interval evolution of superimposed/underlying left PCA distribution infarct, now late subacute to chronic in appearance. Associated susceptibility artifact consistent with petechial blood products and/or laminar necrosis.  MRA HEAD IMPRESSION:  1. Chronic  occlusion of the left PCA at the proximal left P2 segment. 2. 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 3. Short-segment severe mid left M1 stenosis, with short-segment mild to moderate distal right M1 stenosis. 4. Distal small vessel atheromatous irregularity throughout the intracranial circulation.  MRA NECK IMPRESSION:  1. 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 Evaluation for potential intra stent stenosis limited by MRA, although robust flow seen within the ICA distal to the stent. 2. Wide patency of the right carotid artery system without stenosis. 3. Relatively mild 30% atheromatous stenosis at the origins of the vertebral arteries. 4. Short-segment 50% stenosis involving the proximal right subclavian artery, proximal to the takeoff of the right vertebral Artery.  * MRI scan images were reviewed online. I agree with the written report.     Assessment/Plan:  1.  History of left posterior cerebral artery distribution stroke  2.  Recent left middle cerebral artery distribution stroke  3.  History of left carotid artery stenting procedure  The patient is to remain on aspirin and Plavix.  He may be returning home in the near future, he will require ongoing speech therapy evaluation.  He has a daughter who apparent lives about an hour away, she will be checking on him on a regular basis.  She will require FMLA for this.  The patient will follow up here in about 4 months.  Marlan Palau MD 06/26/2020 9:03 AM  [facility] [address] [place], [place] [address]  Phone [phone] Fax [phone]

## 2020-06-26 NOTE — Telephone Encounter (Signed)
Pt FMLA form on the nurse desk.Marland KitchenMarland Kitchen

## 2020-06-27 ENCOUNTER — Telehealth: Payer: Self-pay | Admitting: Emergency Medicine

## 2020-06-27 NOTE — Telephone Encounter (Signed)
Paperwork given to Dr. Anne Hahn for completion

## 2020-07-01 ENCOUNTER — Telehealth: Payer: Self-pay | Admitting: *Deleted

## 2020-07-01 NOTE — Telephone Encounter (Signed)
I faxed pt FMLA form on 07/01/20 to [phone]

## 2020-07-02 HISTORY — PX: IR RADIOLOGIST EVAL & MGMT: IMG5224

## 2020-07-04 ENCOUNTER — Ambulatory Visit: Payer: Medicare HMO | Admitting: Neurology

## 2020-10-08 ENCOUNTER — Other Ambulatory Visit (HOSPITAL_COMMUNITY): Payer: Self-pay | Admitting: Interventional Radiology

## 2020-10-08 ENCOUNTER — Telehealth (HOSPITAL_COMMUNITY): Payer: Self-pay

## 2020-10-08 DIAGNOSIS — I771 Stricture of artery: Secondary | ICD-10-CM

## 2020-10-08 NOTE — Telephone Encounter (Signed)
Called to schedule cta head/neck, no answer, left vm. AW 

## 2020-11-04 ENCOUNTER — Ambulatory Visit: Payer: Medicare HMO | Admitting: Neurology

## 2020-11-04 VITALS — BP 139/76 | HR 59 | Ht 70.0 in | Wt 175.0 lb

## 2020-11-04 DIAGNOSIS — I63332 Cerebral infarction due to thrombosis of left posterior cerebral artery: Secondary | ICD-10-CM | POA: Diagnosis not present

## 2020-11-04 DIAGNOSIS — I6522 Occlusion and stenosis of left carotid artery: Secondary | ICD-10-CM

## 2020-11-04 DIAGNOSIS — H53461 Homonymous bilateral field defects, right side: Secondary | ICD-10-CM | POA: Diagnosis not present

## 2020-11-04 NOTE — Progress Notes (Signed)
[facility] [address] [place]. [place] [address] [phone]       OFFICE PROGRESS NOTE  [name] Date of Birth:  July 09, 1950 Medical Record Number:  [id number]   Referring MD:  Lesia Sago  Reason for Referral: Stroke  HPI: [name] is a pleasant 70 year old Caucasian male seen today for office follow-up visit for stroke.  History is obtained from the patient and review of electronic medical records and I personally reviewed pertinent available imaging films in PACS.  He has past medical history significant for hypertension, diabetes, remote smoking, coronary artery disease who initially presented on 03/16/2020 for sudden onset of memory difficulties, headache and not feeling well.  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  CT angiogram showed left posterior cerebral artery occlusion in the proximal portion with 60% left carotid stenosis and severe left vertebral artery stenosis.  He had an elective left carotid artery angioplasty stenting on 05/03/2020 2 by Dr. Corliss Skains but returned 10 days later to the hospital with increasing confusion couple of days prior to admission.  And CT scan of the head showed a large left parietal occipital infarct.  An neurological exam showed alexia, anomia, graafian right hemianopsia.  MRI showed a new left temporal occipital infarct and was unclear whether this were related to a periprocedural complication from the left carotid angioplasty which she had had 10 days prior or due to high-grade left extracranial ICA stenosis.  Patient underwent emergent left MCA angioplasty stenting as well on 05/22/2020.  2D echo showed from previous admission and showed normal ejection fraction.  LDL cholesterol 42 mg percent.  Hemoglobin A1c is 5.9.  Patient was started on aspirin and Plavix for 3  months followed by aspirin. He states that his speech has improved though he still has some trouble understanding.  He states he is not sure this is due to his decreased hearing.  Still has right-sided peripheral vision loss however he has been driving.  He has had no accidents is learned how to turn his head around to compensate.  Is tolerating aspirin 325 mg daily without bleeding or bruising.  Remains on Lipitor which is tolerating well as well.  Has had no recent follow-up lipid profile checked.  His blood pressure is under good control.  He has no new complaints.  He does have only minor bruising but no bleeding episodes.  He was seen by Dr. 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   HLD (hyperlipidemia)    Hypertension    Myocardial infarction [facility])    1990's   Stroke (HCC) 03/15/2020    Social History:  Social History   Socioeconomic History   Marital status: Single    Spouse name: Not on file   Number of children: Not on file   Years of education: Not on file   Highest education level: Not on file  Occupational History   Not on file  Tobacco Use   Smoking status: Former  Types: Cigarettes    Quit date: 2003    Years since quitting: 19.8   Smokeless tobacco: Never  Vaping Use   Vaping Use: Never used  Substance and Sexual Activity   Alcohol use: Yes    Comment: occasionally. Not since stroke in February   Drug use: No   Sexual activity: Not on file  Other Topics Concern   Not on file  Social History Narrative   Lives at [facility]   Right handed   Drinks 1-2 cups caffeine daily   Social  Determinants of Health   Financial Resource Strain: Not on file  Food Insecurity: Not on file  Transportation Needs: Not on file  Physical Activity: Not on file  Stress: Not on file  Social Connections: Not on file  Intimate Partner Violence: Not on file    Medications:   Current Outpatient Medications on File Prior to Visit  Medication Sig Dispense Refill   acetaminophen (TYLENOL) 500 MG tablet Take 1,000 mg by mouth every 6 (six) hours as needed for moderate pain or headache.     aspirin EC 325 MG EC tablet Take 1 tablet (325 mg total) by mouth daily. 30 tablet 0   atorvastatin (LIPITOR) 80 MG tablet Take 1 tablet (80 mg total) by mouth daily. 30 tablet 0   b complex vitamins capsule Take 1 capsule by mouth daily.     lisinopril (ZESTRIL) 10 MG tablet Take 1 tablet (10 mg total) by mouth daily. 30 tablet 0   traZODone (DESYREL) 50 MG tablet Take 50 mg by mouth at bedtime as needed for sleep.     VITAMIN D-VITAMIN K PO Take 1 capsule by mouth daily.     No current facility-administered medications on file prior to visit.    Allergies:   Allergies  Allergen Reactions   Ciprodex [Ciprofloxacin-Dexamethasone] Swelling    Ear canal swelling   Metformin And Related Other (See Comments)    Upset stomach    Physical Exam General: well developed, well nourished, seated, in no evident distress Head: head normocephalic and atraumatic.   Neck: supple with no carotid or supraclavicular bruits Cardiovascular: regular rate and rhythm, no murmurs Musculoskeletal: no deformity Skin:  no rash/petichiae Vascular:  Normal pulses all extremities  Neurologic Exam Mental Status: Awake and fully alert. Oriented to place and time. Recent and remote memory intact. Attention span, concentration and fund of knowledge appropriate. Mood and affect appropriate.  Mild receptive aphasia with trouble falling two-step commands.  Some trouble with naming and repetition.  Speech is fluent no paraphasic  errors. Cranial Nerves: Fundoscopic exam reveals sharp disc margins. Pupils equal, briskly reactive to light. Extraocular movements full without nystagmus. Visual fields show dense right homonymous hemianopsia to confrontation. Hearing intact. Facial sensation intact. Face, tongue, palate moves normally and symmetrically.  Motor: Normal bulk and tone. Normal strength in all tested extremity muscles. Sensory.: intact to touch , pinprick , position and vibratory sensation.  Coordination: Rapid alternating movements normal in all extremities. Finger-to-nose and heel-to-shin performed accurately bilaterally. Gait and Station: Arises from chair without difficulty. Stance is normal. Gait demonstrates normal stride length and balance . Able to heel, toe and tandem walk without difficulty.  Reflexes: 1+ and symmetric. Toes downgoing.   NIHSS  4 Modified Rankin  3   ASSESSMENT: 70 year old Caucasian male with left PCA infarct in February 2022 with interval left carotid angioplasty stenting in April 2022 followed by left temporal parietal infarction possibly from stenting procedure versus  left MCA stenosis with interval left MCA angioplasty stenting in May 2022.  Patient is improving but still has mild aphasia and significant right-sided peripheral vision loss.  Vascular risk factors of hypertension, hyperlipidemia, extracranial and intracranial stenosis.     PLAN:I had a long d/w patient about his recent strokes, left carotid and middle cerebral artery stenting, risk for recurrent stroke/TIAs, personally independently reviewed imaging studies and stroke evaluation results and answered questions.Continue aspirin 325 mg daily  for secondary stroke prevention and maintain strict control of hypertension with blood pressure goal below 130/90, diabetes with hemoglobin A1c goal below 6.5% and lipids with LDL cholesterol goal below 70 mg/dL. I also advised the patient to eat a healthy diet with plenty of whole  grains, cereals, fruits and vegetables, exercise regularly and maintain ideal body weight check screening follow-up lipid profile, hemoglobin A1c, carotid ultrasound and transcranial Doppler study followup in the future with me in 6 months or call earlier if necessary. Greater than 50% time during this prolonged 45-minute visit was spent on counseling and coordination of care and about his stroke and intra and extracranial stenting and answering questions about stroke prevention Delia Heady MD Note: This document was prepared with digital dictation and possible smart phrase technology. Any transcriptional errors that result from this process are unintentional.

## 2020-11-04 NOTE — Patient Instructions (Signed)
I had a long d/w patient about his recent strokes, left carotid and middle cerebral artery stenting, risk for recurrent stroke/TIAs, personally independently reviewed imaging studies and stroke evaluation results and answered questions.Continue aspirin 325 mg daily  for secondary stroke prevention and maintain strict control of hypertension with blood pressure goal below 130/90, diabetes with hemoglobin A1c goal below 6.5% and lipids with LDL cholesterol goal below 70 mg/dL. I also advised the patient to eat a healthy diet with plenty of whole grains, cereals, fruits and vegetables, exercise regularly and maintain ideal body weight check screening follow-up lipid profile, hemoglobin A1c, carotid ultrasound and transcranial Doppler study followup in the future with me in 6 months or call earlier if necessary. Stroke Prevention Some medical conditions and behaviors are associated with a higher chance of having a stroke. You can help prevent a stroke by making nutrition, lifestyle, and other changes, including managing any medical conditions you may have. What nutrition changes can be made?  Eat healthy foods. You can do this by: Choosing foods high in fiber, such as fresh fruits and vegetables and whole grains. Eating at least 5 or more servings of fruits and vegetables a day. Try to fill half of your plate at each meal with fruits and vegetables. Choosing lean protein foods, such as lean cuts of meat, poultry without skin, fish, tofu, beans, and nuts. Eating low-fat dairy products. Avoiding foods that are high in salt (sodium). This can help lower blood pressure. Avoiding foods that have saturated fat, trans fat, and cholesterol. This can help prevent high cholesterol. Avoiding processed and premade foods. Follow your health care provider's specific guidelines for losing weight, controlling high blood pressure (hypertension), lowering high cholesterol, and managing diabetes. These may include: Reducing  your daily calorie intake. Limiting your daily sodium intake to 1,500 milligrams (mg). Using only healthy fats for cooking, such as olive oil, canola oil, or sunflower oil. Counting your daily carbohydrate intake. What lifestyle changes can be made? Maintain a healthy weight. Talk to your health care provider about your ideal weight. Get at least 30 minutes of moderate physical activity at least 5 days a week. Moderate activity includes brisk walking, biking, and swimming. Do not use any products that contain nicotine or tobacco, such as cigarettes and e-cigarettes. If you need help quitting, ask your health care provider. It may also be helpful to avoid exposure to secondhand smoke. Limit alcohol intake to no more than 1 drink a day for nonpregnant women and 2 drinks a day for men. One drink equals 12 oz of beer, 5 oz of wine, or 1 oz of hard liquor. Stop any illegal drug use. Avoid taking birth control pills. Talk to your health care provider about the risks of taking birth control pills if: You are over 16 years old. You smoke. You get migraines. You have ever had a blood clot. What other changes can be made? Manage your cholesterol levels. Eating a healthy diet is important for preventing high cholesterol. If cholesterol cannot be managed through diet alone, you may also need to take medicines. Take any prescribed medicines to control your cholesterol as told by your health care provider. Manage your diabetes. Eating a healthy diet and exercising regularly are important parts of managing your blood sugar. If your blood sugar cannot be managed through diet and exercise, you may need to take medicines. Take any prescribed medicines to control your diabetes as told by your health care provider. Control your hypertension. To reduce your risk  of stroke, try to keep your blood pressure below 130/80. Eating a healthy diet and exercising regularly are an important part of controlling your blood  pressure. If your blood pressure cannot be managed through diet and exercise, you may need to take medicines. Take any prescribed medicines to control hypertension as told by your health care provider. Ask your health care provider if you should monitor your blood pressure at home. Have your blood pressure checked every year, even if your blood pressure is normal. Blood pressure increases with age and some medical conditions. Get evaluated for sleep disorders (sleep apnea). Talk to your health care provider about getting a sleep evaluation if you snore a lot or have excessive sleepiness. Take over-the-counter and prescription medicines only as told by your health care provider. Aspirin or blood thinners (antiplatelets or anticoagulants) may be recommended to reduce your risk of forming blood clots that can lead to stroke. Make sure that any other medical conditions you have, such as atrial fibrillation or atherosclerosis, are managed. What are the warning signs of a stroke? The warning signs of a stroke can be easily remembered as BEFAST. B is for balance. Signs include: Dizziness. Loss of balance or coordination. Sudden trouble walking. E is for eyes. Signs include: A sudden change in vision. Trouble seeing. F is for face. Signs include: Sudden weakness or numbness of the face. The face or eyelid drooping to one side. A is for arms. Signs include: Sudden weakness or numbness of the arm, usually on one side of the body. S is for speech. Signs include: Trouble speaking (aphasia). Trouble understanding. T is for time. These symptoms may represent a serious problem that is an emergency. Do not wait to see if the symptoms will go away. Get medical help right away. Call your local emergency services (911 in the [place] Do not drive yourself to the hospital. Other signs of stroke may include: A sudden, severe headache with no known cause. Nausea or vomiting. Seizure. Where to find more  information For more information, visit: American Stroke Association: www.strokeassociation.org National Stroke Association: www.stroke.org Summary You can prevent a stroke by eating healthy, exercising, not smoking, limiting alcohol intake, and managing any medical conditions you may have. Do not use any products that contain nicotine or tobacco, such as cigarettes and e-cigarettes. If you need help quitting, ask your health care provider. It may also be helpful to avoid exposure to secondhand smoke. Remember BEFAST for warning signs of stroke. Get help right away if you or a loved one has any of these signs. This information is not intended to replace advice given to you by your health care provider. Make sure you discuss any questions you have with your health care provider. Document Revised: 12/18/2016 Document Reviewed: 02/11/2016 Elsevier Patient Education  2021 [employer].

## 2020-11-13 ENCOUNTER — Other Ambulatory Visit: Payer: Self-pay

## 2020-11-13 ENCOUNTER — Ambulatory Visit (HOSPITAL_BASED_OUTPATIENT_CLINIC_OR_DEPARTMENT_OTHER)
Admission: RE | Admit: 2020-11-13 | Discharge: 2020-11-13 | Disposition: A | Discharge status: Home / Self Care | Payer: Medicare HMO | Source: Ambulatory Visit | Attending: Neurology | Admitting: Neurology

## 2020-11-13 ENCOUNTER — Ambulatory Visit (HOSPITAL_COMMUNITY)
Admission: RE | Admit: 2020-11-13 | Discharge: 2020-11-13 | Disposition: A | Discharge status: Home / Self Care | Payer: Medicare HMO | Source: Ambulatory Visit | Attending: Neurology | Admitting: Neurology

## 2020-11-13 DIAGNOSIS — I63332 Cerebral infarction due to thrombosis of left posterior cerebral artery: Secondary | ICD-10-CM | POA: Diagnosis not present

## 2020-11-13 NOTE — Progress Notes (Signed)
Carotid artery duplex and TCD completed. Refer to "CV Proc" under chart review to view preliminary results.  11/13/2020 2:45 PM Eula Fried., MHA, RVT, RDCS, RDMS

## 2020-11-22 ENCOUNTER — Telehealth: Payer: Self-pay

## 2020-11-22 ENCOUNTER — Encounter: Payer: Self-pay | Admitting: *Deleted

## 2020-11-22 NOTE — Telephone Encounter (Signed)
-----   Message from Micki Riley, MD sent at 11/21/2020  5:10 PM EDT ----- Joneen Roach inform the patient that carotid ultrasound study showed no significant narrowing on the right side.  On the left side the stent is patent with mild but not significant narrowing.  Nothing to worry about ----- Message ----- From: Interface, [employer] One [employer] Sent: 11/13/2020   2:56 PM EDT To: Micki Riley, MD

## 2020-11-22 NOTE — Telephone Encounter (Signed)
Mychart message sent.

## 2020-12-06 LAB — COLOGUARD

## 2021-05-05 ENCOUNTER — Encounter: Payer: Self-pay | Admitting: Neurology

## 2021-05-05 ENCOUNTER — Ambulatory Visit: Payer: Medicare HMO | Admitting: Neurology

## 2021-05-05 VITALS — BP 142/75 | HR 62 | Ht 70.0 in | Wt 189.6 lb

## 2021-05-05 DIAGNOSIS — R0989 Other specified symptoms and signs involving the circulatory and respiratory systems: Secondary | ICD-10-CM | POA: Diagnosis not present

## 2021-05-05 DIAGNOSIS — H53461 Homonymous bilateral field defects, right side: Secondary | ICD-10-CM | POA: Diagnosis not present

## 2021-05-05 DIAGNOSIS — Z8673 Personal history of transient ischemic attack (TIA), and cerebral infarction without residual deficits: Secondary | ICD-10-CM | POA: Diagnosis not present

## 2021-05-05 DIAGNOSIS — I69320 Aphasia following cerebral infarction: Secondary | ICD-10-CM

## 2021-05-05 NOTE — Progress Notes (Signed)
?[facility] ?[address] ?[place]. [place] [address] ?[phone] ? ?     OFFICE PROGRESS NOTE ? ?[name] ?Date of Birth:  06-Aug-1950 ?Medical Record Number:  [id number]  ? ?Referring MD:  Floyde Parkins ? ?Reason for Referral: Stroke ?Initial visit 11/04/2020 [name] is a pleasant 71 year old Caucasian male seen today for office follow-up visit for stroke.  History is obtained from the patient and review of electronic medical records and I personally reviewed pertinent available imaging films in PACS.  He has past medical history significant for hypertension, diabetes, remote smoking, coronary artery disease who initially presented on 03/16/2020 for sudden onset of memory difficulties, headache and not feeling well.  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  CT angiogram showed left posterior cerebral artery occlusion in the proximal portion with 60% left carotid stenosis and severe left vertebral artery stenosis.  He had an elective left carotid artery angioplasty stenting on 05/03/2020 2 by Dr. Estanislado Pandy but returned 10 days later to the hospital with increasing confusion couple of days prior to admission.  And CT scan of the head showed a large left parietal occipital infarct.  An neurological exam showed alexia, anomia, graafian right hemianopsia.  MRI showed a new left temporal occipital infarct and was unclear whether this were related to a periprocedural complication from the left carotid angioplasty which she had had 10 days prior or due to high-grade left extracranial ICA stenosis.  Patient underwent emergent left MCA angioplasty stenting as well on 05/22/2020.  2D echo showed from previous admission and showed normal ejection fraction.  LDL cholesterol 42 mg percent.  Hemoglobin A1c is 5.9.  Patient was started on  aspirin and Plavix for 3 months followed by aspirin. ?He states that his speech has improved though he still has some trouble understanding.  He states he is not sure this is due to his decreased hearing.  Still has right-sided peripheral vision loss however he has been driving.  He has had no accidents is learned how to turn his head around to compensate.  Is tolerating aspirin 325 mg daily without bleeding or bruising.  Remains on Lipitor which is tolerating well as well.  Has had no recent follow-up lipid profile checked.  His blood pressure is under good control.  He has no new complaints.  He does have only minor bruising but no bleeding episodes.  He was seen by Dr. Jannifer Franklin for follow-up and last office visit was on 06/26/2020. ?Update 05/05/2021 : He returns for follow-up after last visit 6 months ago.  He continues to have mild speech difficulties with word finding difficulties and at times but hesitancy.  He gets frustrated easily.  His cousin is accompanying him today states that he gets angry easily and loses his temper.  He is also paranoid.  He is in fact broken relationship with his daughter and grandkids due to his behavior and he is regretful about this.  He is finished speech therapy 4 months ago but feels he needs more.  He remains on aspirin which is tolerating well without bruising or bleeding.  His tolerating Lipitor well without muscle aches and pains.  Last lipid profile on 11/27/2020 showed LDL of 61 mg percent.  Hemoglobin A1c was 5.8.  He had follow-up carotid ultrasound on 11/13/2020 which showed 1-39% right ICA and less than  50% stenosis in the left carotid stent.  Transcranial Doppler study shows normal velocities and anterior circulation but posterior circulation velocities could not be obtained due to technical difficulties. ?ROS:   ?14 system review of systems is positive for memory difficulties, comprehension difficulties, decreased hearing, peripheral vision loss all other systems  negative ? ?PMH:  ?Past Medical History:  ?Diagnosis Date  ? Altered mental status   ? Daughter Margreta Journey   ? 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 HLD (hyperlipidemia)   ? Hypertension   ? Myocardial infarction [facility])   ? 1990's  ? Stroke [facility]) 03/15/2020  ? ? ?Social History:  ?Social History  ? ?Socioeconomic History  ? Marital status: Single  ?  Spouse name: Not on file  ? Number of children: Not on file  ? Years of education: Not on file  ? Highest education level: Not on file  ?Occupational History  ? Not on file  ?Tobacco Use  ? Smoking status: Former  ?  Types: Cigarettes  ?  Quit date: 2003  ?  Years since quitting: 20.3  ? Smokeless tobacco: Never  ?Vaping Use  ? Vaping Use: Never used  ?Substance and Sexual Activity  ? Alcohol use: Yes  ?  Comment: occasionally. Not since stroke in February  ? Drug use: No  ? Sexual activity: Not on file  ?Other Topics Concern  ? Not on file  ?Social History Narrative  ? Lives at [facility]  ? Right handed  ? Drinks 1-2 cups caffeine daily  ? ?Social Determinants of Health  ? ?Financial Resource Strain: Not on file  ?Food Insecurity: Not on file  ?Transportation Needs: Not on file  ?Physical Activity: Not on file  ?Stress: Not on file  ?Social Connections: Not on file  ?Intimate Partner Violence: Not on file  ? ? ?Medications:   ?Current Outpatient Medications on File Prior to Visit  ?Medication Sig Dispense Refill  ? acetaminophen (TYLENOL) 500 MG tablet Take 1,000 mg by mouth every 6 (six) hours as needed for moderate pain or headache.    ? aspirin EC 325 MG EC tablet Take 1 tablet (325 mg total) by mouth daily. 30 tablet 0  ? atorvastatin (LIPITOR) 80 MG tablet Take 1 tablet (80 mg total) by mouth daily. 30 tablet 0  ? b complex vitamins capsule Take 1 capsule by mouth daily.    ? lisinopril (ZESTRIL) 10 MG tablet Take 1 tablet (10 mg total) by mouth daily. 30 tablet 0  ? traZODone (DESYREL) 50 MG tablet  Take 50 mg by mouth at bedtime as needed for sleep.    ? VITAMIN D-VITAMIN K PO Take 1 capsule by mouth daily.    ? ?No current facility-administered medications on file prior to visit.  ? ? ?Allergies:   ?Allergies  ?Allergen Reactions  ? Ciprodex [Ciprofloxacin-Dexamethasone] Swelling  ?  Ear canal swelling  ? Metformin And Related Other (See Comments)  ?  Upset stomach  ? ? ?Physical Exam ?General: well developed, well nourished, seated, in no evident distress ?Head: head normocephalic and atraumatic.   ?Neck: supple with soft left  carotid  bruit ?Cardiovascular: regular rate and rhythm, no murmurs ?Musculoskeletal: no deformity ?Skin:  no rash/petichiae ?Vascular:  Normal pulses all extremities ? ?Neurologic Exam ?Mental Status: Awake and fully alert. Oriented to place and time. Recent and remote memory intact. Attention span, concentration and fund of knowledge appropriate. Mood and affect  appropriate.  Mild receptive aphasia with trouble falling two-step commands.  Some trouble with naming and repetition.  Speech is fluent with only occasional paraphasic errors. ?Cranial Nerves: Fundoscopic exam not done.. Pupils equal, briskly reactive to light. Extraocular movements full without nystagmus. Visual fields show dense right homonymous hemianopsia to confrontation. Hearing intact. Facial sensation intact. Face, tongue, palate moves normally and symmetrically.  ?Motor: Normal bulk and tone. Normal strength in all tested extremity muscles. ?Sensory.: intact to touch , pinprick , position and vibratory sensation.  ?Coordination: Rapid alternating movements normal in all extremities. Finger-to-nose and heel-to-shin performed accurately bilaterally. ?Gait and Station: Arises from chair without difficulty. Stance is normal. Gait demonstrates normal stride length and balance . Able to heel, toe and tandem walk without difficulty.  ?Reflexes: 1+ and symmetric. Toes downgoing.  ? ? ? ?ASSESSMENT: 71 year old Caucasian  male with left PCA infarct in February 2022 with interval left carotid angioplasty stenting in April 2022 followed by left temporal parietal infarction possibly from stenting procedure versus left MCA stenosis with in

## 2021-05-05 NOTE — Patient Instructions (Signed)
I had a long d/w patient and his cousin about his remote stroke, residual visual field defect and aphasia and left carotid stent, risk for recurrent stroke/TIAs, personally independently reviewed imaging studies and stroke evaluation results and answered questions.Continue aspirin 81 mg daily  for secondary stroke prevention and maintain strict control of hypertension with blood pressure goal below 130/90, diabetes with hemoglobin A1c goal below 6.5% and lipids with LDL cholesterol goal below 70 mg/dL. I also advised the patient to eat a healthy diet with plenty of whole grains, cereals, fruits and vegetables, exercise regularly and maintain ideal body weight .check screening carotid ultrasound for left carotid stent surveillance.  Followup in the future with my nurse practitioner in 6 months or call earlier if necessary. ? ?

## 2021-05-12 ENCOUNTER — Telehealth: Payer: Self-pay | Admitting: Neurology

## 2021-05-12 NOTE — Telephone Encounter (Signed)
[employer] no auth req, sent a message to Donna she will reach out to the patient to schedule.  

## 2021-06-12 ENCOUNTER — Ambulatory Visit (HOSPITAL_COMMUNITY)
Admission: RE | Admit: 2021-06-12 | Discharge: 2021-06-12 | Disposition: A | Discharge status: Home / Self Care | Payer: Medicare HMO | Source: Ambulatory Visit | Attending: Neurology | Admitting: Neurology

## 2021-06-12 ENCOUNTER — Encounter: Payer: Self-pay | Admitting: *Deleted

## 2021-06-12 DIAGNOSIS — R0989 Other specified symptoms and signs involving the circulatory and respiratory systems: Secondary | ICD-10-CM | POA: Insufficient documentation

## 2021-06-12 NOTE — Progress Notes (Signed)
Kindly inform the patient that carotid artery stent is wide open without significant narrowing.

## 2021-06-12 NOTE — Progress Notes (Signed)
VASCULAR LAB    Carotid duplex has been performed.  See CV proc for preliminary results.   Ludwin Flahive, RVT 06/12/2021, 10:37 AM

## 2021-11-03 NOTE — Progress Notes (Deleted)
Patient: [name] Date of Birth: [birth year]  Reason for Visit: Follow up History from: Patient Primary Neurologist: Leonie Man  ASSESSMENT AND PLAN 71 y.o. 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     HISTORY  Initial visit 11/04/2020 [name] is a pleasant 71 year old Caucasian male seen today for office follow-up visit for stroke.  History is obtained from the patient and review of electronic medical records and I personally reviewed pertinent available imaging films in PACS.  He has past medical history significant for hypertension, diabetes, remote smoking, coronary artery disease who initially presented on 03/16/2020 for sudden onset of memory difficulties, headache and not feeling well.  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  CT angiogram showed left posterior cerebral artery occlusion in the proximal portion with 60% left carotid stenosis and severe left vertebral artery stenosis.  He had an elective left carotid artery angioplasty stenting on 05/03/2020 2 by Dr. Estanislado Pandy but returned 10 days later to the hospital with increasing confusion couple of days prior to admission.  And CT scan of the head showed a large left parietal occipital infarct.  An neurological exam showed alexia, anomia, graafian right hemianopsia.  MRI showed a new left temporal occipital infarct and was unclear whether this were related to a periprocedural complication from the left carotid angioplasty which she had had 10 days prior or due to high-grade left extracranial ICA stenosis.  Patient  underwent emergent left MCA angioplasty stenting as well on 05/22/2020.  2D echo showed from previous admission and showed normal ejection fraction.  LDL cholesterol 42 mg percent.  Hemoglobin A1c is 5.9.  Patient was started on aspirin and Plavix for 3 months followed by aspirin. He states that his speech has improved though he still has some trouble understanding.  He states he is not sure this is due to his decreased hearing.  Still has right-sided peripheral vision loss however he has been driving.  He has had no accidents is learned how to turn his head around to compensate.  Is tolerating aspirin 325 mg daily without bleeding or bruising.  Remains on Lipitor which is tolerating well as well.  Has had no recent follow-up lipid profile checked.  His blood pressure is under good control.  He has no new complaints.  He does have only minor bruising but no bleeding episodes.  He was seen by Dr. Jannifer Franklin for follow-up and last office visit was on 06/26/2020. Update 05/05/2021 : He returns for follow-up after last visit 6 months ago.  He continues to have mild speech difficulties with word finding difficulties and at times but hesitancy.  He gets frustrated easily.  His cousin is accompanying him today states that he gets angry easily and loses his temper.  He is also paranoid.  He is in fact broken relationship with his daughter and grandkids due to his behavior and he is regretful about this.  He is finished speech therapy 4 months ago but feels he needs more.  He remains on aspirin which is tolerating well without bruising or bleeding.  His tolerating Lipitor well without muscle aches and  pains.  Last lipid profile on 11/27/2020 showed LDL of 61 mg percent.  Hemoglobin A1c was 5.8.  He had follow-up carotid ultrasound on 11/13/2020 which showed 1-39% right ICA and less than 50% stenosis in the left carotid stent.  Transcranial Doppler study shows normal velocities and anterior circulation but posterior circulation  velocities could not be obtained due to technical difficulties.  Update November 04, 2021 SS:   REVIEW OF SYSTEMS: Out of a complete 14 system review of symptoms, the patient complains only of the following symptoms, and all other reviewed systems are negative.  See HPI  ALLERGIES: Allergies  Allergen Reactions   Ciprodex [Ciprofloxacin-Dexamethasone] Swelling    Ear canal swelling   Metformin And Related Other (See Comments)    Upset stomach    HOME MEDICATIONS: Outpatient Medications Prior to Visit  Medication Sig Dispense Refill   acetaminophen (TYLENOL) 500 MG tablet Take 1,000 mg by mouth every 6 (six) hours as needed for moderate pain or headache.     aspirin EC 325 MG EC tablet Take 1 tablet (325 mg total) by mouth daily. 30 tablet 0   atorvastatin (LIPITOR) 80 MG tablet Take 1 tablet (80 mg total) by mouth daily. 30 tablet 0   b complex vitamins capsule Take 1 capsule by mouth daily.     lisinopril (ZESTRIL) 10 MG tablet Take 1 tablet (10 mg total) by mouth daily. 30 tablet 0   traZODone (DESYREL) 50 MG tablet Take 50 mg by mouth at bedtime as needed for sleep.     VITAMIN D-VITAMIN K PO Take 1 capsule by mouth daily.     No facility-administered medications prior to visit.    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 ([place]) 06/26/2020   HLD (hyperlipidemia)    Hypertension    Myocardial infarction ([place])    1990's   Stroke ([place]) 03/15/2020    PAST SURGICAL HISTORY: Past Surgical History:  Procedure Laterality Date   IR ANGIO INTRA EXTRACRAN SEL COM CAROTID INNOMINATE BILAT MOD SED  05/02/2020   IR ANGIO INTRA EXTRACRAN SEL INTERNAL CAROTID UNI L MOD SED  06/04/2020   IR ANGIO VERTEBRAL SEL SUBCLAVIAN INNOMINATE UNI L MOD SED  05/02/2020   IR ANGIO VERTEBRAL SEL SUBCLAVIAN INNOMINATE UNI R MOD SED  05/20/2020   IR ANGIO VERTEBRAL SEL  VERTEBRAL UNI R MOD SED  05/02/2020   IR INTRA CRAN STENT  06/04/2020   IR INTRAVSC STENT CERV CAROTID W/EMB-PROT MOD SED INCL ANGIO  05/20/2020   IR RADIOLOGIST EVAL & MGMT  03/27/2020   IR RADIOLOGIST EVAL & MGMT  07/02/2020   IR US GUIDE VASC ACCESS RIGHT  05/02/2020   IR US GUIDE VASC ACCESS RIGHT  05/20/2020   IR US GUIDE VASC ACCESS RIGHT  06/04/2020   RADIOLOGY WITH ANESTHESIA Left 05/20/2020   Procedure: RADIOLOGY WITH ANESTHESIA  LEFT ICA STENTING;  Surgeon: Luanne Bras, MD;  Location: [facility];  Service: Radiology;  Laterality: Left;   RADIOLOGY WITH ANESTHESIA Left 06/04/2020   Procedure: LEFT MIDDLE CAROTID  ARTERY STENT;  Surgeon: Luanne Bras, MD;  Location: [place];  Service: Radiology;  Laterality: Left;    FAMILY HISTORY: Family History  Problem Relation Age of Onset   Heart attack Father    Stroke Paternal Great-grandfather     SOCIAL HISTORY: Social History   Socioeconomic  History   Marital status: Single    Spouse name: Not on file   Number of children: Not on file   Years of education: Not on file   Highest education level: Not on file  Occupational History   Not on file  Tobacco Use   Smoking status: Former    Types: Cigarettes    Quit date: 2003    Years since quitting: 20.8   Smokeless tobacco: Never  Vaping Use   Vaping Use: Never used  Substance and Sexual Activity   Alcohol use: Yes    Comment: occasionally. Not since stroke in February   Drug use: No   Sexual activity: Not on file  Other Topics Concern   Not on file  Social History Narrative   Lives at [facility]   Right handed   Drinks 1-2 cups caffeine daily   Social Determinants of Health   Financial Resource Strain: Not on file  Food Insecurity: Not on file  Transportation Needs: Not on file  Physical Activity: Not on file  Stress: Not on file  Social Connections: Not on file  Intimate Partner Violence: Not on file    PHYSICAL EXAM  There were no vitals filed for  this visit. There is no height or weight on file to calculate BMI.  Generalized: Well developed, in no acute distress  Neurological examination  Mentation: Alert oriented to time, place, history taking. Follows all commands speech and language fluent Cranial nerve II-XII: Pupils were equal round reactive to light. Extraocular movements were full, visual field were full on confrontational test. Facial sensation and strength were normal. Uvula tongue midline. Head turning and shoulder shrug  were normal and symmetric. Motor: The motor testing reveals 5 over 5 strength of all 4 extremities. Good symmetric motor tone is noted throughout.  Sensory: Sensory testing is intact to soft touch on all 4 extremities. No evidence of extinction is noted.  Coordination: Cerebellar testing reveals good finger-nose-finger and heel-to-shin bilaterally.  Gait and station: Gait is normal. Tandem gait is normal. Romberg is negative. No drift is seen.  Reflexes: Deep tendon reflexes are symmetric and normal bilaterally.   DIAGNOSTIC DATA (LABS, IMAGING, TESTING) - I reviewed patient records, labs, notes, testing and imaging myself where available.  Lab Results  Component Value Date   WBC 7.5 06/06/2020   HGB 10.1 (L) 06/06/2020   HCT 30.4 (L) 06/06/2020   MCV 98.1 06/06/2020   PLT 259 06/06/2020      Component Value Date/Time   NA 139 06/05/2020 0419   K 3.7 06/05/2020 0419   CL 106 06/05/2020 0419   CO2 29 06/05/2020 0419   GLUCOSE 116 (H) 06/05/2020 0419   BUN 13 06/05/2020 0419   CREATININE 0.90 06/07/2020 0438   CALCIUM 9.5 06/05/2020 0419   PROT 7.3 05/31/2020 1411   ALBUMIN 4.1 05/31/2020 1411   AST 26 05/31/2020 1411   ALT 26 05/31/2020 1411   ALKPHOS 62 05/31/2020 1411   BILITOT 2.0 (H) 05/31/2020 1411   GFRNONAA >60 06/07/2020 0438   GFRAA  08/12/2008 0630    >60        The eGFR has been calculated using the MDRD equation. This calculation has not been validated in all  clinical situations. eGFR's persistently <60 mL/min signify possible Chronic Kidney Disease.   Lab Results  Component Value Date   CHOL 92 06/01/2020   HDL 42 06/01/2020   LDLCALC 42 06/01/2020   TRIG 40 06/01/2020  CHOLHDL 2.2 06/01/2020   Lab Results  Component Value Date   HGBA1C 5.9 (H) 06/01/2020   Lab Results  Component Value Date   VITAMINB12 688 06/02/2020   No results found for: "TSH"  Butler Denmark, AGNP-C, DNP 11/03/2021, 9:09 AM [facility] [address] [place], [place] [address] [phone]

## 2021-11-04 ENCOUNTER — Ambulatory Visit: Payer: Medicare HMO | Admitting: Neurology

## 2021-12-02 ENCOUNTER — Ambulatory Visit: Payer: Medicare HMO | Admitting: Neurology

## 2021-12-30 ENCOUNTER — Ambulatory Visit: Payer: Medicare HMO | Admitting: Neurology

## 2021-12-30 ENCOUNTER — Encounter: Payer: Self-pay | Admitting: Neurology

## 2021-12-30 VITALS — BP 146/69 | HR 61 | Ht 70.0 in | Wt 196.5 lb

## 2021-12-30 DIAGNOSIS — I63532 Cerebral infarction due to unspecified occlusion or stenosis of left posterior cerebral artery: Secondary | ICD-10-CM | POA: Diagnosis not present

## 2021-12-30 DIAGNOSIS — I69328 Other speech and language deficits following cerebral infarction: Secondary | ICD-10-CM | POA: Diagnosis not present

## 2021-12-30 DIAGNOSIS — I69359 Hemiplegia and hemiparesis following cerebral infarction affecting unspecified side: Secondary | ICD-10-CM | POA: Diagnosis not present

## 2021-12-30 NOTE — Patient Instructions (Addendum)
IR will reach out to you about scheduling for a CT scan to follow up on the stent, watch for a call from [facility] Keep BP < 130/90, LDL < 70, A1C < 7.0 Continue taking aspirin  See you back in 6 months

## 2021-12-30 NOTE — Progress Notes (Signed)
Patient: [name] Date of Birth: [birth year]  Reason for Visit: Follow up for stroke History from: Patient, cousin [name] Primary Neurologist: Shelby PLAN 71 y.o. 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  Patient is improving but still has mild aphasia and significant right-sided peripheral vision loss.  Vascular risk factors of hypertension, hyperlipidemia, extracranial and intracranial stenosis.   -He is overall doing well, remains stable,  continues to work hard to improve, he is active -Remains on aspirin 325 mg daily -Goal BP less than 130/90, LDL less than 70, A1c less than 7.0, recent labs show he is at goal  -I chatted with [name] ([employer]), she tried to reach him September 2022 about scheduling CT, she will reach back out -Carotid ultrasound May 2023 showed patent carotid artery stent -I will see him back in 6 months, if he continues to do well he will return here as needed  HISTORY Initial visit 11/04/2020 [name] is a pleasant 71 year old Caucasian male seen today for office follow-up visit for stroke.  History is obtained from the patient and review of electronic medical records and I personally reviewed pertinent available imaging films in PACS.  He has past medical history significant for hypertension, diabetes, remote smoking, coronary artery disease who initially presented on 03/16/2020 for sudden onset of memory difficulties, headache and not feeling well.  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  CT angiogram showed left  posterior cerebral artery occlusion in the proximal portion with 60% left carotid stenosis and severe left vertebral artery stenosis.  He had an elective left carotid artery angioplasty stenting on 05/03/2020 2 by Dr. Estanislado Pandy but returned 10 days later to the hospital with increasing confusion couple of days prior to admission.  And CT scan of the head showed a large left parietal occipital infarct.  An neurological exam showed alexia, anomia, graafian right hemianopsia.  MRI showed a new left temporal occipital infarct and was unclear whether this were related to a periprocedural complication from the left carotid angioplasty which she had had 10 days prior or due to high-grade left extracranial ICA stenosis.  Patient underwent emergent left MCA angioplasty stenting as well on 05/22/2020.  2D echo showed from previous admission and showed normal ejection fraction.  LDL cholesterol 42 mg percent.  Hemoglobin A1c is 5.9.  Patient was started on aspirin and Plavix for 3 months followed by aspirin. He states that his speech has improved though he still has some trouble understanding.  He states he is not sure this is due to his decreased hearing.  Still has right-sided peripheral vision loss however he has been driving.  He has had no accidents is learned how to turn his head around to compensate.  Is tolerating aspirin 325 mg daily without bleeding or bruising.  Remains on Lipitor which is tolerating well as well.  Has had no recent follow-up lipid profile checked.  His blood pressure is under good control.  He has no new complaints.  He does have only minor bruising but no bleeding episodes.  He was seen by Dr. Jannifer Franklin for  follow-up and last office visit was on 06/26/2020. Update 05/05/2021 : He returns for follow-up after last visit 6 months ago.  He continues to have mild speech difficulties with word finding difficulties and at times but hesitancy.  He gets frustrated easily.  His cousin is accompanying him today  states that he gets angry easily and loses his temper.  He is also paranoid.  He is in fact broken relationship with his daughter and grandkids due to his behavior and he is regretful about this.  He is finished speech therapy 4 months ago but feels he needs more.  He remains on aspirin which is tolerating well without bruising or bleeding.  His tolerating Lipitor well without muscle aches and pains.  Last lipid profile on 11/27/2020 showed LDL of 61 mg percent.  Hemoglobin A1c was 5.8.  He had follow-up carotid ultrasound on 11/13/2020 which showed 1-39% right ICA and less than 50% stenosis in the left carotid stent.  Transcranial Doppler study shows normal velocities and anterior circulation but posterior circulation velocities could not be obtained due to technical difficulties.   Update December 30, 2021 SS: Here with cousin, [name]. Doing overall well, has trouble at times with word findings, says the wrong thing, drives only short distances. He does not read well, hard time processing.  Labs 12/19/21 A1c 5.6, LDL 62. Had carotid ultrasound 06/12/21 right carotid 1-39% stenosis, left carotid velocities have increased in the proximal stent since prior study, however remains less than 50% stenosis. Remains on aspirin 325 mg daily (no bleeding or bruising), Lipitor 80 mg daily. He lives alone, walks around his neighborhood. [place] off a ladder in October, had hematoma to left leg. He mentions how hard he has worked to recover. BP slightly up today 146/69, runs around 120/70's.   REVIEW OF SYSTEMS: Out of a complete 14 system review of symptoms, the patient complains only of the following symptoms, and all other reviewed systems are negative.  See HPI  ALLERGIES: Allergies  Allergen Reactions   Ciprodex [Ciprofloxacin-Dexamethasone] Swelling    Ear canal swelling   Metformin And Related Other (See Comments)    Upset stomach    HOME MEDICATIONS: Outpatient Medications Prior to Visit  Medication Sig  Dispense Refill   acetaminophen (TYLENOL) 500 MG tablet Take 1,000 mg by mouth every 6 (six) hours as needed for moderate pain or headache.     aspirin EC 325 MG EC tablet Take 1 tablet (325 mg total) by mouth daily. 30 tablet 0   atorvastatin (LIPITOR) 80 MG tablet Take 1 tablet (80 mg total) by mouth daily. 30 tablet 0   b complex vitamins capsule Take 1 capsule by mouth daily.     lisinopril (ZESTRIL) 10 MG tablet Take 1 tablet (10 mg total) by mouth daily. 30 tablet 0   traZODone (DESYREL) 50 MG tablet Take 50 mg by mouth at bedtime as needed for sleep.     VITAMIN D-VITAMIN K PO Take 1 capsule by mouth daily.     No facility-administered medications prior to visit.    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 ([place]) 06/26/2020   HLD (hyperlipidemia)    Hypertension    Myocardial infarction [facility])    1990's   Stroke ([place]) 03/15/2020    PAST SURGICAL HISTORY: Past Surgical History:  Procedure Laterality Date   IR  ANGIO INTRA EXTRACRAN SEL COM CAROTID INNOMINATE BILAT MOD SED  05/02/2020   IR ANGIO INTRA EXTRACRAN SEL INTERNAL CAROTID UNI L MOD SED  06/04/2020   IR ANGIO VERTEBRAL SEL SUBCLAVIAN INNOMINATE UNI L MOD SED  05/02/2020   IR ANGIO VERTEBRAL SEL SUBCLAVIAN INNOMINATE UNI R MOD SED  05/20/2020   IR ANGIO VERTEBRAL SEL VERTEBRAL UNI R MOD SED  05/02/2020   IR INTRA CRAN STENT  06/04/2020   IR INTRAVSC STENT CERV CAROTID W/EMB-PROT MOD SED INCL ANGIO  05/20/2020   IR RADIOLOGIST EVAL & MGMT  03/27/2020   IR RADIOLOGIST EVAL & MGMT  07/02/2020   IR US GUIDE VASC ACCESS RIGHT  05/02/2020   IR US GUIDE VASC ACCESS RIGHT  05/20/2020   IR US GUIDE VASC ACCESS RIGHT  06/04/2020   RADIOLOGY WITH ANESTHESIA Left 05/20/2020   Procedure: RADIOLOGY WITH ANESTHESIA  LEFT ICA STENTING;  Surgeon: Luanne Bras, MD;  Location: [place];  Service: Radiology;   Laterality: Left;   RADIOLOGY WITH ANESTHESIA Left 06/04/2020   Procedure: LEFT MIDDLE CAROTID  ARTERY STENT;  Surgeon: Luanne Bras, MD;  Location: [place];  Service: Radiology;  Laterality: Left;    FAMILY HISTORY: Family History  Problem Relation Age of Onset   Heart attack Father    Stroke Paternal Great-grandfather     SOCIAL HISTORY: Social History   Socioeconomic History   Marital status: Single    Spouse name: Not on file   Number of children: Not on file   Years of education: Not on file   Highest education level: Not on file  Occupational History   Not on file  Tobacco Use   Smoking status: Former    Types: Cigarettes    Quit date: 2003    Years since quitting: 20.9   Smokeless tobacco: Never  Vaping Use   Vaping Use: Never used  Substance and Sexual Activity   Alcohol use: Yes    Comment: occasionally. Not since stroke in February   Drug use: No   Sexual activity: Not on file  Other Topics Concern   Not on file  Social History Narrative   [facility] at [facility]   Right handed   Drinks 1-2 cups caffeine daily   Social Determinants of Health   Financial Resource Strain: Not on file  Food Insecurity: Not on file  Transportation Needs: Not on file  Physical Activity: Not on file  Stress: Not on file  Social Connections: Not on file  Intimate Partner Violence: Not on file    PHYSICAL EXAM  Vitals:   12/30/21 1006  BP: (!) 146/69  Pulse: 61  Weight: 196 lb 8 oz (89.1 kg)  Height: _0  (1.778 m)   Body mass index is 28.19 kg/m.  Generalized: Well developed, in no acute distress  Neurological examination  Mentation: Alert oriented to time, place, history taking.  Mild receptive aphasia, central following commands.  Occasionally uses the wrong word. Cranial nerve II-XII: Pupils were equal round reactive to light. Extraocular movements were full, right homonymous hemianopsia. Facial sensation and strength were normal. Head turning  and shoulder shrug  were normal and symmetric.  Motor: The motor testing reveals 5 over 5 strength of all 4 extremities. Good symmetric motor tone is noted throughout.  Sensory: Sensory testing is intact to soft touch on all 4 extremities. No evidence of extinction is noted.  Coordination: Cerebellar testing reveals good finger-nose-finger and heel-to-shin bilaterally.  Some apraxia with performing these  commands. Gait and station: Gait is normal.  Reflexes: Deep tendon reflexes are symmetric but decreased  DIAGNOSTIC DATA (LABS, IMAGING, TESTING) - I reviewed patient records, labs, notes, testing and imaging myself where available.  Lab Results  Component Value Date   WBC 7.5 06/06/2020   HGB 10.1 (L) 06/06/2020   HCT 30.4 (L) 06/06/2020   MCV 98.1 06/06/2020   PLT 259 06/06/2020      Component Value Date/Time   NA 139 06/05/2020 0419   K 3.7 06/05/2020 0419   CL 106 06/05/2020 0419   CO2 29 06/05/2020 0419   GLUCOSE 116 (H) 06/05/2020 0419   BUN 13 06/05/2020 0419   CREATININE 0.90 06/07/2020 0438   CALCIUM 9.5 06/05/2020 0419   PROT 7.3 05/31/2020 1411   ALBUMIN 4.1 05/31/2020 1411   AST 26 05/31/2020 1411   ALT 26 05/31/2020 1411   ALKPHOS 62 05/31/2020 1411   BILITOT 2.0 (H) 05/31/2020 1411   GFRNONAA >60 06/07/2020 0438   GFRAA  08/12/2008 0630    >60        The eGFR has been calculated using the MDRD equation. This calculation has not been validated in all clinical situations. eGFR's persistently <60 mL/min signify possible Chronic Kidney Disease.   Lab Results  Component Value Date   CHOL 92 06/01/2020   HDL 42 06/01/2020   LDLCALC 42 06/01/2020   TRIG 40 06/01/2020   CHOLHDL 2.2 06/01/2020   Lab Results  Component Value Date   HGBA1C 5.9 (H) 06/01/2020   Lab Results  Component Value Date   ASTMHDQQ22 979 06/02/2020   No results found for: "TSH"  Butler Denmark, AGNP-C, DNP 12/30/2021, 10:49 AM [facility] [address] [place],  [address] [phone]

## 2022-01-04 NOTE — Progress Notes (Signed)
I agree with the above plan 

## 2022-01-13 LAB — COLOGUARD: COLOGUARD: POSITIVE — AB

## 2022-01-22 ENCOUNTER — Telehealth (HOSPITAL_COMMUNITY): Payer: Self-pay

## 2022-01-22 ENCOUNTER — Other Ambulatory Visit (HOSPITAL_COMMUNITY): Payer: Self-pay | Admitting: Interventional Radiology

## 2022-01-22 DIAGNOSIS — I771 Stricture of artery: Secondary | ICD-10-CM

## 2022-01-22 NOTE — Telephone Encounter (Signed)
Called to schedule cta head/neck, no answer, left vm. AW 

## 2022-07-29 ENCOUNTER — Ambulatory Visit: Payer: Medicare HMO | Admitting: Neurology

## 2022-07-29 ENCOUNTER — Encounter: Payer: Self-pay | Admitting: Neurology

## 2022-07-29 VITALS — BP 138/68 | HR 69 | Ht 70.0 in | Wt 194.4 lb

## 2022-07-29 DIAGNOSIS — I10 Essential (primary) hypertension: Secondary | ICD-10-CM

## 2022-07-29 DIAGNOSIS — I63512 Cerebral infarction due to unspecified occlusion or stenosis of left middle cerebral artery: Secondary | ICD-10-CM

## 2022-07-29 DIAGNOSIS — I63532 Cerebral infarction due to unspecified occlusion or stenosis of left posterior cerebral artery: Secondary | ICD-10-CM

## 2022-07-29 NOTE — Progress Notes (Addendum)
Patient: [name] Date of Birth: 12/25/1950  Reason for Visit: Follow up for stroke History from: Patient, cousin Bonita Quin Primary Neurologist: Pearlean Brownie   ASSESSMENT AND PLAN 72 y.o. 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  Patient has residual right-sided peripheral vision loss.  Vascular risk factors of hypertension, hyperlipidemia, extracranial and intracranial stenosis.   -IR will have to reorder his CTA head and neck imaging and get authorized, I messaged with scheduler, will contact Bonita Quin (his cousin) to arrange -Remains on aspirin 325 mg daily, when imaging returns consider if can drop to 81 mg daily from neuro standpoint  -Strict management of vascular risk factors with a goal BP less than 130/90, A1c less than 7.0, LDL less than 70 for secondary stroke prevention -Carotid ultrasound May 2023 showed patent carotid artery stent -I will see him back in 1 year or sooner if needed  Addendum 08/17/22 SS: Reviewed with Dr. Pearlean Brownie, he can reduce to aspirin 81 mg daily for secondary stroke prevention.  Needs to follow-up with IR, they called and left him a message. I tried to call his contact # and house #, no answer, will have my nurse continue to reach him.   HISTORY Initial visit 11/04/2020 [name] is a pleasant 72 year old Caucasian male seen today for office follow-up visit for stroke.  History is obtained from the patient and review of electronic medical records and I personally reviewed pertinent available imaging films in PACS.  He has past medical history significant for hypertension, diabetes, remote smoking, coronary artery disease who initially presented on 03/16/2020 for sudden onset of memory  difficulties, headache and not feeling well.  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  CT angiogram showed left posterior cerebral artery occlusion in the proximal portion with 60% left carotid stenosis and severe left vertebral artery stenosis.  He had an elective left carotid artery angioplasty stenting on 05/03/2020 2 by Dr. Corliss Skains but returned 10 days later to the hospital with increasing confusion couple of days prior to admission.  And CT scan of the head showed a large left parietal occipital infarct.  An neurological exam showed alexia, anomia, graafian right hemianopsia.  MRI showed a new left temporal occipital infarct and was unclear whether this were related to a periprocedural complication from the left carotid angioplasty which she had had 10 days prior or due to high-grade left extracranial ICA stenosis.  Patient underwent emergent left MCA angioplasty stenting as well on 05/22/2020.  2D echo showed from previous admission and showed normal ejection fraction.  LDL cholesterol 42 mg percent.  Hemoglobin A1c is 5.9.  Patient was started on aspirin and Plavix for 3 months followed by aspirin. He states that his speech has improved though he still has some trouble understanding.  He states he is not sure this is due to his decreased hearing.  Still has right-sided peripheral vision loss however he has been driving.  He has had no accidents is learned how to turn his head around to compensate.  Is tolerating aspirin 325 mg daily without bleeding or bruising.  Remains on Lipitor which is tolerating  well as well.  Has had no recent follow-up lipid profile checked.  His blood pressure is under good control.  He has no new complaints.  He does have only minor bruising but no bleeding episodes.  He was seen by Dr. Anne Hahn for follow-up and last office visit  was on 06/26/2020. Update 05/05/2021 : He returns for follow-up after last visit 6 months ago.  He continues to have mild speech difficulties with word finding difficulties and at times but hesitancy.  He gets frustrated easily.  His cousin is accompanying him today states that he gets angry easily and loses his temper.  He is also paranoid.  He is in fact broken relationship with his daughter and grandkids due to his behavior and he is regretful about this.  He is finished speech therapy 4 months ago but feels he needs more.  He remains on aspirin which is tolerating well without bruising or bleeding.  His tolerating Lipitor well without muscle aches and pains.  Last lipid profile on 11/27/2020 showed LDL of 61 mg percent.  Hemoglobin A1c was 5.8.  He had follow-up carotid ultrasound on 11/13/2020 which showed 1-39% right ICA and less than 50% stenosis in the left carotid stent.  Transcranial Doppler study shows normal velocities and anterior circulation but posterior circulation velocities could not be obtained due to technical difficulties.   Update December 30, 2021 SS: Here with cousin, Bonita Quin. Doing overall well, has trouble at times with word findings, says the wrong thing, drives only short distances. He does not read well, hard time processing.  Labs 12/19/21 A1c 5.6, LDL 62. Had carotid ultrasound 06/12/21 right carotid 1-39% stenosis, left carotid velocities have increased in the proximal stent since prior study, however remains less than 50% stenosis. Remains on aspirin 325 mg daily (no bleeding or bruising), Lipitor 80 mg daily. He lives alone, walks around his neighborhood. Larey Seat off a ladder in October, had hematoma to left leg. He mentions how hard he has worked to recover. BP slightly up today 146/69, runs around 120/70's.   Update July 29, 2022 SS: Here with Bonita Quin. Has no concerns. He can get overwhelmed when a lot is going on, hard to focus. Lives alone, drives short distances. Is active, doesn't sit  around. He walks, works, Occupational hygienist. IR tried to call him about CTA in Jan, he didn't call back. Speech sounds good today, no aphasia noted. On aspirin 325 mg daily. On Lipitor LDL 60, A1C 6.0 June 2024. BP 138/68. Yesterday he had 21,000 steps. His mood is better.   REVIEW OF SYSTEMS: Out of a complete 14 system review of symptoms, the patient complains only of the following symptoms, and all other reviewed systems are negative.  See HPI  ALLERGIES: Allergies  Allergen Reactions   Ciprodex [Ciprofloxacin-Dexamethasone] Swelling    Ear canal swelling   Metformin And Related Other (See Comments)    Upset stomach    HOME MEDICATIONS: Outpatient Medications Prior to Visit  Medication Sig Dispense Refill   acetaminophen (TYLENOL) 500 MG tablet Take 1,000 mg by mouth every 6 (six) hours as needed for moderate pain or headache.     aspirin EC 325 MG EC tablet Take 1 tablet (325 mg total) by mouth daily. 30 tablet 0   atorvastatin (LIPITOR) 80 MG tablet Take 1 tablet (80 mg total) by mouth daily. 30 tablet 0   b complex vitamins capsule Take 1 capsule by mouth daily.     lisinopril (ZESTRIL) 10 MG tablet Take 1  tablet (10 mg total) by mouth daily. 30 tablet 0   traZODone (DESYREL) 50 MG tablet Take 50 mg by mouth at bedtime as needed for sleep.     VITAMIN D-VITAMIN K PO Take 1 capsule by mouth daily.     No facility-administered medications prior to visit.    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   HLD (hyperlipidemia)    Hypertension    Myocardial infarction (HCC)    1990's   Stroke (HCC) 03/15/2020    PAST SURGICAL HISTORY: Past Surgical History:  Procedure Laterality Date   IR ANGIO INTRA EXTRACRAN SEL COM CAROTID INNOMINATE BILAT MOD SED  05/02/2020   IR ANGIO INTRA EXTRACRAN SEL INTERNAL CAROTID UNI L MOD SED  06/04/2020   IR  ANGIO VERTEBRAL SEL SUBCLAVIAN INNOMINATE UNI L MOD SED  05/02/2020   IR ANGIO VERTEBRAL SEL SUBCLAVIAN INNOMINATE UNI R MOD SED  05/20/2020   IR ANGIO VERTEBRAL SEL VERTEBRAL UNI R MOD SED  05/02/2020   IR INTRA CRAN STENT  06/04/2020   IR INTRAVSC STENT CERV CAROTID W/EMB-PROT MOD SED INCL ANGIO  05/20/2020   IR RADIOLOGIST EVAL & MGMT  03/27/2020   IR RADIOLOGIST EVAL & MGMT  07/02/2020   IR US GUIDE VASC ACCESS RIGHT  05/02/2020   IR US GUIDE VASC ACCESS RIGHT  05/20/2020   IR US GUIDE VASC ACCESS RIGHT  06/04/2020   RADIOLOGY WITH ANESTHESIA Left 05/20/2020   Procedure: RADIOLOGY WITH ANESTHESIA  LEFT ICA STENTING;  Surgeon: Julieanne Cotton, MD;  Location: [facility] OR;  Service: Radiology;  Laterality: Left;   RADIOLOGY WITH ANESTHESIA Left 06/04/2020   Procedure: LEFT MIDDLE CAROTID  ARTERY STENT;  Surgeon: Julieanne Cotton, MD;  Location: [facility] OR;  Service: Radiology;  Laterality: Left;    FAMILY HISTORY: Family History  Problem Relation Age of Onset   Heart attack Father    Stroke Paternal Great-grandfather     SOCIAL HISTORY: Social History   Socioeconomic History   Marital status: Single    Spouse name: Not on file   Number of children: Not on file   Years of education: Not on file   Highest education level: Not on file  Occupational History   Not on file  Tobacco Use   Smoking status: Former    Types: Cigarettes    Quit date: 2003    Years since quitting: 21.5   Smokeless tobacco: Never  Vaping Use   Vaping Use: Never used  Substance and Sexual Activity   Alcohol use: Yes    Comment: occasionally. Not since stroke in February   Drug use: No   Sexual activity: Not on file  Other Topics Concern   Not on file  Social History Narrative   L[facility] at [facility]   Right handed   Drinks 1-2 cups caffeine daily   Social Determinants of Health   Financial Resource Strain: Not on file  Food Insecurity: Not on file  Transportation Needs: Not on file  Physical  Activity: Not on file  Stress: Not on file  Social Connections: Not on file  Intimate Partner Violence: Not on file   PHYSICAL EXAM  Vitals:   07/29/22 1415  BP: 138/68  Pulse: 69  Weight: 194 lb 6.4 oz (88.2 kg)  Height: 5\' 10"  (1.778 m)   Body  mass index is 27.89 kg/m.  Generalized: Well developed, in no acute distress  Neurological examination  Mentation: Alert oriented to time, place, history taking.  No aphasia noted today, carries conversation well, no word finding trouble.  There is trouble following exam commands. Cranial nerve II-XII: Pupils were equal round reactive to light. Extraocular movements were full, right homonymous hemianopsia. Facial sensation and strength were normal. Head turning and shoulder shrug were normal and symmetric.  Motor: The motor testing reveals 5 over 5 strength of all 4 extremities. Good symmetric motor tone is noted throughout.  Sensory: Sensory testing is intact to soft touch on all 4 extremities. No evidence of extinction is noted.  Coordination: Cerebellar testing reveals good finger-nose-finger and heel-to-shin bilaterally.  Some apraxia with performing these commands. Gait and station: Gait is normal.  Reflexes: Deep tendon reflexes are symmetric but decreased  DIAGNOSTIC DATA (LABS, IMAGING, TESTING) - I reviewed patient records, labs, notes, testing and imaging myself where available.  Lab Results  Component Value Date   WBC 7.5 06/06/2020   HGB 10.1 (L) 06/06/2020   HCT 30.4 (L) 06/06/2020   MCV 98.1 06/06/2020   PLT 259 06/06/2020      Component Value Date/Time   NA 139 06/05/2020 0419   K 3.7 06/05/2020 0419   CL 106 06/05/2020 0419   CO2 29 06/05/2020 0419   GLUCOSE 116 (H) 06/05/2020 0419   BUN 13 06/05/2020 0419   CREATININE 0.90 06/07/2020 0438   CALCIUM 9.5 06/05/2020 0419   PROT 7.3 05/31/2020 1411   ALBUMIN 4.1 05/31/2020 1411   AST 26 05/31/2020 1411   ALT 26 05/31/2020 1411   ALKPHOS 62 05/31/2020 1411    BILITOT 2.0 (H) 05/31/2020 1411   GFRNONAA >60 06/07/2020 0438   GFRAA  08/12/2008 0630    >60        The eGFR has been calculated using the MDRD equation. This calculation has not been validated in all clinical situations. eGFR's persistently <60 mL/min signify possible Chronic Kidney Disease.   Lab Results  Component Value Date   CHOL 92 06/01/2020   HDL 42 06/01/2020   LDLCALC 42 06/01/2020   TRIG 40 06/01/2020   CHOLHDL 2.2 06/01/2020   Lab Results  Component Value Date   HGBA1C 5.9 (H) 06/01/2020   Lab Results  Component Value Date   VITAMINB12 688 06/02/2020   No results found for: "TSH"  Margie Ege, AGNP-C, DNP 07/29/2022, 3:06 PM [facility] [address], [address] [place], [place] [address] [phone]

## 2022-07-29 NOTE — Patient Instructions (Signed)
Continue aspirin 325 mg daily   Strict management of vascular risk factors with a goal BP less than 130/90, A1c less than 7.0, LDL less than 70 for secondary stroke prevention  Dr. Fatima Sanger office will contact you to schedule. Look out for their call.

## 2022-08-12 ENCOUNTER — Telehealth (HOSPITAL_COMMUNITY): Payer: Self-pay

## 2022-08-12 ENCOUNTER — Encounter (HOSPITAL_COMMUNITY): Payer: Self-pay | Admitting: Interventional Radiology

## 2022-08-12 NOTE — Telephone Encounter (Signed)
Called to schedule cta, no answer, left vm. AB 

## 2022-08-15 NOTE — Progress Notes (Signed)
I agree with the above plan 

## 2022-08-18 ENCOUNTER — Telehealth: Payer: Self-pay

## 2022-08-18 ENCOUNTER — Telehealth (HOSPITAL_COMMUNITY): Payer: Self-pay

## 2022-08-18 NOTE — Telephone Encounter (Signed)
-----   Message from Glean Salvo sent at 08/17/2022  4:01 PM EDT ----- Please try to reach him, I was unable with both #'s to convey Addendum notes. Needs to reach IR

## 2022-08-18 NOTE — Telephone Encounter (Signed)
Called daughter to schedule cta, no answer, left vm. AB

## 2022-08-18 NOTE — Telephone Encounter (Signed)
I called and relayed message per Maralyn Sago and daughter states she will call and schedule and let him know the updates. Daughter on [place]. Appreciative of the call.

## 2022-09-08 ENCOUNTER — Ambulatory Visit (HOSPITAL_COMMUNITY)
Admission: RE | Admit: 2022-09-08 | Discharge: 2022-09-08 | Disposition: A | Discharge status: Home / Self Care | Payer: Medicare HMO | Source: Ambulatory Visit | Attending: Interventional Radiology | Admitting: Interventional Radiology

## 2022-09-08 DIAGNOSIS — I771 Stricture of artery: Secondary | ICD-10-CM | POA: Diagnosis present

## 2022-09-08 MED ORDER — IOHEXOL 350 MG/ML SOLN
75.0000 mL | Freq: Once | INTRAVENOUS | Status: AC | PRN
  Administered 2022-09-08: 75 mL via INTRAVENOUS

## 2022-09-08 MED ORDER — SODIUM CHLORIDE (PF) 0.9 % IJ SOLN
INTRAMUSCULAR | Status: AC
  Filled 2022-09-08: qty 50

## 2022-09-10 ENCOUNTER — Telehealth (HOSPITAL_COMMUNITY): Payer: Self-pay

## 2022-09-10 NOTE — Telephone Encounter (Signed)
Called pt regarding recent imaging, no answer, left vm. AB  

## 2022-09-14 ENCOUNTER — Telehealth (HOSPITAL_COMMUNITY): Payer: Self-pay

## 2022-09-14 NOTE — Telephone Encounter (Signed)
Pt's daughter agreed to have him f/u in 6 months with a cta and he will continue with ASA 81mg . AB

## 2023-01-11 ENCOUNTER — Telehealth: Payer: Self-pay | Admitting: Neurology

## 2023-01-11 NOTE — Telephone Encounter (Signed)
Pt's relative confirming upcoming appt details

## 2023-03-24 ENCOUNTER — Telehealth (HOSPITAL_COMMUNITY): Payer: Self-pay

## 2023-03-24 NOTE — Telephone Encounter (Signed)
Called to schedule US carotid, no answer, left vm. AB

## 2023-04-08 ENCOUNTER — Other Ambulatory Visit (HOSPITAL_COMMUNITY): Payer: Self-pay | Admitting: Interventional Radiology

## 2023-04-08 DIAGNOSIS — I771 Stricture of artery: Secondary | ICD-10-CM

## 2023-04-22 ENCOUNTER — Ambulatory Visit (HOSPITAL_COMMUNITY)
Admission: RE | Admit: 2023-04-22 | Discharge: 2023-04-22 | Disposition: A | Discharge status: Home / Self Care | Source: Ambulatory Visit | Attending: Interventional Radiology | Admitting: Interventional Radiology

## 2023-04-22 DIAGNOSIS — E119 Type 2 diabetes mellitus without complications: Secondary | ICD-10-CM | POA: Diagnosis present

## 2023-04-22 DIAGNOSIS — I771 Stricture of artery: Secondary | ICD-10-CM | POA: Diagnosis present

## 2023-04-22 LAB — POCT I-STAT CREATININE: Creatinine, Ser: 1.2 mg/dL (ref 0.61–1.24)

## 2023-04-22 MED ORDER — IOHEXOL 350 MG/ML SOLN
75.0000 mL | Freq: Once | INTRAVENOUS | Status: AC | PRN
  Administered 2023-04-22: 75 mL via INTRAVENOUS

## 2023-07-17 ENCOUNTER — Encounter (HOSPITAL_COMMUNITY): Payer: Self-pay | Admitting: Interventional Radiology

## 2023-07-29 ENCOUNTER — Telehealth: Payer: Self-pay | Admitting: Neurology

## 2023-07-29 NOTE — Telephone Encounter (Signed)
 Appointment details confirmed

## 2023-08-05 ENCOUNTER — Encounter: Payer: Self-pay | Admitting: Neurology

## 2023-08-05 ENCOUNTER — Other Ambulatory Visit (HOSPITAL_COMMUNITY): Payer: Self-pay | Admitting: Interventional Radiology

## 2023-08-05 ENCOUNTER — Ambulatory Visit: Payer: Medicare HMO | Admitting: Neurology

## 2023-08-05 ENCOUNTER — Telehealth (HOSPITAL_COMMUNITY): Payer: Self-pay

## 2023-08-05 VITALS — BP 120/82 | HR 62 | Ht 70.0 in | Wt 190.0 lb

## 2023-08-05 DIAGNOSIS — I63512 Cerebral infarction due to unspecified occlusion or stenosis of left middle cerebral artery: Secondary | ICD-10-CM

## 2023-08-05 DIAGNOSIS — I6522 Occlusion and stenosis of left carotid artery: Secondary | ICD-10-CM

## 2023-08-05 DIAGNOSIS — I10 Essential (primary) hypertension: Secondary | ICD-10-CM

## 2023-08-05 DIAGNOSIS — I63532 Cerebral infarction due to unspecified occlusion or stenosis of left posterior cerebral artery: Secondary | ICD-10-CM

## 2023-08-05 DIAGNOSIS — I771 Stricture of artery: Secondary | ICD-10-CM

## 2023-08-05 DIAGNOSIS — I6602 Occlusion and stenosis of left middle cerebral artery: Secondary | ICD-10-CM

## 2023-08-05 NOTE — Telephone Encounter (Signed)
 Called pt's daughter to let her know that we have entered a referral for [name] to f/u with Dr. Lester since Dr. Dolphus is leaving. AB

## 2023-08-05 NOTE — Progress Notes (Signed)
 Patient: [name] Date of Birth: 01/16/1951  Reason for Visit: Follow up for stroke History from: Patient, cousin Rock Primary Neurologist: Rosemarie   ASSESSMENT AND PLAN 73 y.o. 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  Patient has residual right-sided peripheral vision loss.  Vascular risk factors of hypertension, hyperlipidemia, extracranial and intracranial stenosis.   -Spoke with IR scheduler, since Dr. Dolphus is leaving, he will be referred to Dr. Lester, will need CTA head and neck in October 2025 - Continue aspirin  81 mg daily for secondary stroke prevention -Strict management of vascular risk factors with a goal BP less than 130/90, A1c less than 7.0, LDL less than 70 for secondary stroke prevention - He will follow-up with me in 1 year, at that point can likely return PRN, just want to make sure he has follow up for his intracranial stenosis   HISTORY Initial visit 11/04/2020 [name] is a pleasant 73 year old Caucasian male seen today for office follow-up visit for stroke.  History is obtained from the patient and review of electronic medical records and I personally reviewed pertinent available imaging films in PACS.  He has past medical history significant for hypertension, diabetes, remote smoking, coronary artery disease who initially presented on 03/16/2020 for sudden onset of memory difficulties, headache and not feeling well.  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  CT angiogram showed left posterior cerebral artery occlusion in the proximal  portion with 60% left carotid stenosis and severe left vertebral artery stenosis.  He had an elective left carotid artery angioplasty stenting on 05/03/2020 2 by Dr. Dolphus but returned 10 days later to the hospital with increasing confusion couple of days prior to admission.  And CT scan of the head showed a large left parietal occipital infarct.  An neurological exam showed alexia, anomia, graafian right hemianopsia.  MRI showed a new left temporal occipital infarct and was unclear whether this were related to a periprocedural complication from the left carotid angioplasty which she had had 10 days prior or due to high-grade left extracranial ICA stenosis.  Patient underwent emergent left MCA angioplasty stenting as well on 05/22/2020.  2D echo showed from previous admission and showed normal ejection fraction.  LDL cholesterol 42 mg percent.  Hemoglobin A1c is 5.9.  Patient was started on aspirin  and Plavix  for 3 months followed by aspirin . He states that his speech has improved though he still has some trouble understanding.  He states he is not sure this is due to his decreased hearing.  Still has right-sided peripheral vision loss however he has been driving.  He has had no accidents is learned how to turn his head around to compensate.  Is tolerating aspirin  325 mg daily without bleeding or bruising.  Remains on Lipitor  which is tolerating well as well.  Has had no recent follow-up lipid profile checked.  His blood pressure is under good control.  He has no new complaints.  He does have only minor bruising but no bleeding episodes.  He was seen by Dr. Jenel for follow-up and last office visit was on 06/26/2020. Update 05/05/2021 :  He returns for follow-up after last visit 6 months ago.  He continues to have mild speech difficulties with word finding difficulties and at times but hesitancy.  He gets frustrated easily.  His cousin is accompanying him today states that he gets angry easily and loses his temper.   He is also paranoid.  He is in fact broken relationship with his daughter and grandkids due to his behavior and he is regretful about this.  He is finished speech therapy 4 months ago but feels he needs more.  He remains on aspirin  which is tolerating well without bruising or bleeding.  His tolerating Lipitor  well without muscle aches and pains.  Last lipid profile on 11/27/2020 showed LDL of 61 mg percent.  Hemoglobin A1c was 5.8.  He had follow-up carotid ultrasound on 11/13/2020 which showed 1-39% right ICA and less than 50% stenosis in the left carotid stent.  Transcranial Doppler study shows normal velocities and anterior circulation but posterior circulation velocities could not be obtained due to technical difficulties.   Update December 30, 2021 SS: Here with cousin, Rock. Doing overall well, has trouble at times with word findings, says the wrong thing, drives only short distances. He does not read well, hard time processing.  Labs 12/19/21 A1c 5.6, LDL 62. Had carotid ultrasound 06/12/21 right carotid 1-39% stenosis, left carotid velocities have increased in the proximal stent since prior study, however remains less than 50% stenosis. Remains on aspirin  325 mg daily (no bleeding or bruising), Lipitor  80 mg daily. He lives alone, walks around his neighborhood. Clemens off a ladder in October, had hematoma to left leg. He mentions how hard he has worked to recover. BP slightly up today 146/69, runs around 120/70's.   Update July 29, 2022 SS: Here with Rock. Has no concerns. He can get overwhelmed when a lot is going on, hard to focus. Lives alone, drives short distances. Is active, doesn't sit around. He walks, works, Occupational hygienist. IR tried to call him about CTA in Jan, he didn't call back. Speech sounds good today, no aphasia noted. On aspirin  325 mg daily. On Lipitor  LDL 60, A1C 6.0 June 2024. BP 138/68. Yesterday he had 21,000 steps. His mood is better.   Update August 05, 2023 SS: His cousin, Rock brought him  today. No issues. He is now taking aspirin  81 mg daily. BP 120/82. LDL 70, A1C 6.1. lives alone, drives short distances. He stays busy doing work at his house in the yard. His mind is constantly working, has anxiety. Right sided peripheral vision is main issue from CVA. Spoke with IR scheduler, plan to have repeat CTA in October, Dr. Dolphus is leaving, will be referred to Dr. Lester.   CTA neck:    1. Prior stenting of the distal left common carotid and proximal left internal carotid arteries. Unchanged from the prior CTA of 09/08/2022, there is up to 40% narrowing of the stented portion of the proximal left internal carotid artery. 2. The common carotid and cervical right internal carotid arteries are patent within the neck without hemodynamically significant stenosis. Atherosclerotic plaque within these vessels as described. 3. Atherosclerotic plaque within the bilateral cervical vertebral arteries with unchanged stenosis. Most notably, there is moderate stenosis of the left vertebral artery proximal V1 segment and severe stenosis of the left vertebral artery at the V3/V4 junction. 4. Up to 50% atherosclerotic narrowing of the proximal right subclavian artery, unchanged. 5. Aortic Atherosclerosis (ICD10-I70.0).   CTA head:   1. No significant change from  the CTA head of 09/08/2022. 2. The left posterior cerebral artery is occluded proximally. 3. Patent stent within the left middle cerebral artery M1 segment. Stent artifact precludes evaluation for in-stent stenosis. 4. Background intracranial atherosclerotic disease with multiple stenosis, most notably as follows. 5. Severe stenosis of the left vertebral artery at the V3/V4 junction. 6. Severe stenosis within the right posterior cerebral artery P2 segment.  REVIEW OF SYSTEMS: Out of a complete 14 system review of symptoms, the patient complains only of the following symptoms, and all other reviewed systems are negative.  See  HPI  ALLERGIES: Allergies  Allergen Reactions   Ciprodex [Ciprofloxacin-Dexamethasone] Swelling    Ear canal swelling   Metformin  And Related Other (See Comments)    Upset stomach    HOME MEDICATIONS: Outpatient Medications Prior to Visit  Medication Sig Dispense Refill   acetaminophen  (TYLENOL ) 500 MG tablet Take 1,000 mg by mouth every 6 (six) hours as needed for moderate pain or headache.     aspirin  EC 325 MG EC tablet Take 1 tablet (325 mg total) by mouth daily. 30 tablet 0   atorvastatin  (LIPITOR ) 80 MG tablet Take 1 tablet (80 mg total) by mouth daily. 30 tablet 0   b complex vitamins capsule Take 1 capsule by mouth daily.     lisinopril  (ZESTRIL ) 10 MG tablet Take 1 tablet (10 mg total) by mouth daily. 30 tablet 0   traZODone  (DESYREL ) 50 MG tablet Take 50 mg by mouth at bedtime as needed for sleep.     VITAMIN D-VITAMIN K PO Take 1 capsule by mouth daily.     No facility-administered medications prior to visit.    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   HLD (hyperlipidemia)    Hypertension    Myocardial infarction (HCC)    1990's   Stroke (HCC) 03/15/2020    PAST SURGICAL HISTORY: Past Surgical History:  Procedure Laterality Date   IR ANGIO INTRA EXTRACRAN SEL COM CAROTID INNOMINATE BILAT MOD SED  05/02/2020   IR ANGIO INTRA EXTRACRAN SEL INTERNAL CAROTID UNI L MOD SED  06/04/2020   IR ANGIO VERTEBRAL SEL SUBCLAVIAN INNOMINATE UNI L MOD SED  05/02/2020   IR ANGIO VERTEBRAL SEL SUBCLAVIAN INNOMINATE UNI R MOD SED  05/20/2020   IR ANGIO VERTEBRAL SEL VERTEBRAL UNI R MOD SED  05/02/2020   IR INTRA CRAN STENT  06/04/2020   IR INTRAVSC STENT CERV CAROTID W/EMB-PROT MOD SED INCL ANGIO  05/20/2020   IR RADIOLOGIST EVAL & MGMT  03/27/2020   IR RADIOLOGIST EVAL & MGMT  07/02/2020   IR US  GUIDE VASC ACCESS RIGHT  05/02/2020    IR US  GUIDE VASC ACCESS RIGHT  05/20/2020   IR US  GUIDE VASC ACCESS RIGHT  06/04/2020   RADIOLOGY WITH ANESTHESIA Left 05/20/2020   Procedure: RADIOLOGY WITH ANESTHESIA  LEFT ICA STENTING;  Surgeon: Dolphus Carrion, MD;  Location: [facility] OR;  Service: Radiology;  Laterality: Left;   RADIOLOGY WITH ANESTHESIA Left 06/04/2020   Procedure: LEFT MIDDLE CAROTID  ARTERY STENT;  Surgeon: Dolphus Carrion, MD;  Location: [facility] OR;  Service: Radiology;  Laterality: Left;    FAMILY HISTORY: Family History  Problem Relation Age of Onset   Heart attack Father    Stroke Paternal Great-grandfather     SOCIAL HISTORY: Social History   Socioeconomic History  Marital status: Single    Spouse name: Not on file   Number of children: Not on file   Years of education: Not on file   Highest education level: Not on file  Occupational History   Not on file  Tobacco Use   Smoking status: Former    Current packs/day: 0.00    Types: Cigarettes    Quit date: 2003    Years since quitting: 22.5   Smokeless tobacco: Never  Vaping Use   Vaping status: Never Used  Substance and Sexual Activity   Alcohol use: Yes    Comment: occasionally. Not since stroke in February   Drug use: No   Sexual activity: Not on file  Other Topics Concern   Not on file  Social History Narrative   L[facility] at [facility]   Right handed   Drinks 1-2 cups caffeine daily   Social Drivers of Health   Financial Resource Strain: Not on file  Food Insecurity: Low Risk  (06/30/2023)   Received from [facility]   Hunger Vital Sign    Within the past 12 months, you worried that your food would run out before you got money to buy more: Never true    Within the past 12 months, the food you bought just didn't last and you didn't have money to get more. : Never true  Transportation Needs: No Transportation Needs (06/30/2023)   Received from [facility]    In the past 12 months, has lack of reliable  transportation kept you from medical appointments, meetings, work or from getting things needed for daily living? : No  Physical Activity: Not on file  Stress: Not on file  Social Connections: Not on file  Intimate Partner Violence: Not on file   PHYSICAL EXAM  Vitals:   08/05/23 1413  BP: 120/82  Pulse: 62  SpO2: 98%  Weight: 190 lb (86.2 kg)  Height: 5' 10 (1.778 m)    Body mass index is 27.26 kg/m.  Generalized: Well developed, in no acute distress  Neurological examination  Mentation: Alert oriented to time, place, history taking.  No aphasia noted today, carries conversation well, sometimes is off topic  There is trouble following exam commands. Cranial nerve II-XII: Pupils were equal round reactive to light. Extraocular movements were full, right homonymous hemianopsia. Facial sensation and strength were normal. Head turning and shoulder shrug were normal and symmetric.  Motor: The motor testing reveals 5 over 5 strength of all 4 extremities. Good symmetric motor tone is noted throughout.  Sensory: Sensory testing is intact to soft touch on all 4 extremities. No evidence of extinction is noted.  Coordination: Cerebellar testing reveals good finger-nose-finger and heel-to-shin bilaterally.  Some apraxia with performing these commands. Gait and station: Gait is normal.  Reflexes: Deep tendon reflexes are symmetric but decreased  DIAGNOSTIC DATA (LABS, IMAGING, TESTING) - I reviewed patient records, labs, notes, testing and imaging myself where available.  Lab Results  Component Value Date   WBC 7.5 06/06/2020   HGB 10.1 (L) 06/06/2020   HCT 30.4 (L) 06/06/2020   MCV 98.1 06/06/2020   PLT 259 06/06/2020      Component Value Date/Time   NA 139 06/05/2020 0419   K 3.7 06/05/2020 0419   CL 106 06/05/2020 0419   CO2 29 06/05/2020 0419   GLUCOSE 116 (H) 06/05/2020 0419   BUN 13 06/05/2020 0419   CREATININE 1.20 04/22/2023 1238   CALCIUM  9.5 06/05/2020 0419  PROT 7.3  05/31/2020 1411   ALBUMIN 4.1 05/31/2020 1411   AST 26 05/31/2020 1411   ALT 26 05/31/2020 1411   ALKPHOS 62 05/31/2020 1411   BILITOT 2.0 (H) 05/31/2020 1411   GFRNONAA >60 06/07/2020 0438   GFRAA  08/12/2008 0630    >60        The eGFR has been calculated using the MDRD equation. This calculation has not been validated in all clinical situations. eGFR's persistently <60 mL/min signify possible Chronic Kidney Disease.   Lab Results  Component Value Date   CHOL 92 06/01/2020   HDL 42 06/01/2020   LDLCALC 42 06/01/2020   TRIG 40 06/01/2020   CHOLHDL 2.2 06/01/2020   Lab Results  Component Value Date   HGBA1C 5.9 (H) 06/01/2020   Lab Results  Component Value Date   VITAMINB12 688 06/02/2020   No results found for: TSH  Lauraine Born, AGNP-C, DNP 08/05/2023, 2:18 PM [facility] [address] [place], [place] [address] [phone]

## 2023-08-05 NOTE — Patient Instructions (Signed)
 You will be referred to Dr. Lester to monitor CT angiogram, you will need a scan in October, if you have not heard anything around that time please reach out to me   Continue aspirin  81 mg daily   Strict management of vascular risk factors with a goal BP less than 130/90, A1c less than 7.0, LDL less than 70 for secondary stroke prevention

## 2024-02-16 ENCOUNTER — Telehealth: Payer: Self-pay | Admitting: Neurology

## 2024-02-16 NOTE — Telephone Encounter (Signed)
 MYC cxl

## 2024-07-19 ENCOUNTER — Ambulatory Visit: Admitting: Neurology

## 2024-08-08 ENCOUNTER — Ambulatory Visit: Admitting: Neurology
